# Patient Record
Sex: Male | Born: 1945 | Race: White | Hispanic: No | Marital: Married | Smoking: Former smoker
Health system: Southern US, Community
[De-identification: ages and names within clinical notes are randomized; demographics above are authoritative.]

## PROBLEM LIST (undated history)

## (undated) DIAGNOSIS — I495 Sick sinus syndrome: Secondary | ICD-10-CM

## (undated) DIAGNOSIS — IMO0001 Reserved for inherently not codable concepts without codable children: Secondary | ICD-10-CM

## (undated) DIAGNOSIS — I4892 Unspecified atrial flutter: Secondary | ICD-10-CM

## (undated) DIAGNOSIS — I517 Cardiomegaly: Secondary | ICD-10-CM

## (undated) DIAGNOSIS — I10 Essential (primary) hypertension: Secondary | ICD-10-CM

## (undated) DIAGNOSIS — Z720 Tobacco use: Secondary | ICD-10-CM

## (undated) DIAGNOSIS — I493 Ventricular premature depolarization: Secondary | ICD-10-CM

## (undated) DIAGNOSIS — Z7901 Long term (current) use of anticoagulants: Secondary | ICD-10-CM

## (undated) DIAGNOSIS — I2510 Atherosclerotic heart disease of native coronary artery without angina pectoris: Secondary | ICD-10-CM

## (undated) DIAGNOSIS — I4819 Other persistent atrial fibrillation: Secondary | ICD-10-CM

## (undated) DIAGNOSIS — K219 Gastro-esophageal reflux disease without esophagitis: Secondary | ICD-10-CM

## (undated) DIAGNOSIS — E785 Hyperlipidemia, unspecified: Secondary | ICD-10-CM

## (undated) DIAGNOSIS — I440 Atrioventricular block, first degree: Secondary | ICD-10-CM

## (undated) DIAGNOSIS — F329 Major depressive disorder, single episode, unspecified: Secondary | ICD-10-CM

## (undated) HISTORY — PX: TEE WITH CARDIOVERSION: SHX5442

## (undated) HISTORY — DX: Other persistent atrial fibrillation: I48.19

## (undated) HISTORY — DX: Sick sinus syndrome: I49.5

## (undated) HISTORY — DX: Atherosclerotic heart disease of native coronary artery without angina pectoris: I25.10

## (undated) HISTORY — DX: Major depressive disorder, single episode, unspecified: F32.9

## (undated) HISTORY — DX: Cardiomegaly: I51.7

## (undated) HISTORY — PX: HERNIA REPAIR: SHX51

## (undated) HISTORY — PX: CARDIAC CATHETERIZATION: SHX172

## (undated) HISTORY — DX: Unspecified atrial flutter: I48.92

## (undated) HISTORY — PX: VASECTOMY: SHX75

## (undated) HISTORY — DX: Tobacco use: Z72.0

## (undated) HISTORY — DX: Essential (primary) hypertension: I10

## (undated) HISTORY — DX: Reserved for inherently not codable concepts without codable children: IMO0001

## (undated) HISTORY — DX: Long term (current) use of anticoagulants: Z79.01

## (undated) HISTORY — DX: Hyperlipidemia, unspecified: E78.5

## (undated) HISTORY — DX: Ventricular premature depolarization: I49.3

---

## 1994-08-05 HISTORY — PX: FOOT SURGERY: SHX648

## 1999-10-04 ENCOUNTER — Ambulatory Visit (HOSPITAL_COMMUNITY): Admission: RE | Admit: 1999-10-04 | Discharge: 1999-10-04 | Payer: Self-pay | Admitting: Cardiology

## 2001-07-01 ENCOUNTER — Ambulatory Visit (HOSPITAL_COMMUNITY): Admission: RE | Admit: 2001-07-01 | Discharge: 2001-07-01 | Payer: Self-pay | Admitting: Gastroenterology

## 2001-07-01 ENCOUNTER — Encounter: Payer: Self-pay | Admitting: Gastroenterology

## 2004-12-07 ENCOUNTER — Ambulatory Visit (HOSPITAL_COMMUNITY): Admission: RE | Admit: 2004-12-07 | Discharge: 2004-12-07 | Payer: Self-pay | Admitting: Cardiology

## 2005-01-22 ENCOUNTER — Ambulatory Visit: Payer: Self-pay | Admitting: Internal Medicine

## 2005-01-29 ENCOUNTER — Inpatient Hospital Stay (HOSPITAL_COMMUNITY): Admission: AD | Admit: 2005-01-29 | Discharge: 2005-01-31 | Payer: Self-pay | Admitting: Cardiology

## 2005-01-30 ENCOUNTER — Ambulatory Visit: Payer: Self-pay | Admitting: Internal Medicine

## 2005-03-18 ENCOUNTER — Ambulatory Visit: Payer: Self-pay | Admitting: Internal Medicine

## 2008-01-28 ENCOUNTER — Ambulatory Visit (HOSPITAL_COMMUNITY): Admission: RE | Admit: 2008-01-28 | Discharge: 2008-01-28 | Payer: Self-pay | Admitting: General Surgery

## 2008-01-28 HISTORY — PX: INGUINAL HERNIA REPAIR: SHX194

## 2009-08-05 DIAGNOSIS — IMO0001 Reserved for inherently not codable concepts without codable children: Secondary | ICD-10-CM

## 2009-08-05 HISTORY — DX: Reserved for inherently not codable concepts without codable children: IMO0001

## 2009-08-24 ENCOUNTER — Encounter: Admission: RE | Admit: 2009-08-24 | Discharge: 2009-08-24 | Payer: Self-pay | Admitting: Podiatry

## 2010-08-16 ENCOUNTER — Ambulatory Visit: Payer: Self-pay | Admitting: Cardiology

## 2010-12-18 NOTE — Op Note (Signed)
NAME:  [name], [name] NO.:  [id number]   MEDICAL RECORD NO.:  [id number]          PATIENT TYPE:  AMB   LOCATION:  DAY                          FACILITY:  [facility]   PHYSICIAN:  Angelia Mould. Derrell Lolling, M.D.DATE OF BIRTH:  [birth year]   DATE OF PROCEDURE:  01/28/2008  DATE OF DISCHARGE:                               OPERATIVE REPORT   PREOPERATIVE DIAGNOSIS:  Recurrent right inguinal hernia.   POSTOPERATIVE DIAGNOSIS:  Recurrent right inguinal hernia.   OPERATION PERFORMED:  Laparoscopic, preperitoneal repair of recurrent  right inguinal hernia with mesh.   SURGEON:  Dr. Claud Kelp.   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  He states that mesh was not used.  He has had  pain and a bulge in his right groin for several months.  I evaluated him  as an outpatient and found that he had a moderate-sized right inguinal  hernia but there was no evidence of hernia on the left.  He wanted to  have this repaired electively and is brought to operating room  electively.   OPERATIVE FINDINGS:  On the right side he had a large indirect hernia  sac which was empty and could be dissected well back away from the  abdominal wall.  He also had some weakness and bulging in the direct  space.  No other abnormalities were noted.   OPERATIVE TECHNIQUE:  Following the induction of general endotracheal  anesthesia, a Foley catheter was inserted.  The bladder was emptied.  The Foley was taped the leg and the balloon deflated with the Foley left  in place.  Intravenous antibiotics were given.  The patient was  identified as to correct patient, correct procedure and correct site.  The abdomen and genitalia were prepped and draped in a sterile fashion.  0.5% Marcaine with epinephrine was used as a local infiltration  anesthetic.  A curved transverse incision was made  at the lower rim of  the umbilicus.  The fascia was incised transversely exposing the medial  border of the right rectus muscle.  The dissector balloon was inserted  behind the right rectus muscle through the right rectus sheath down to  the symphysis pubis at the midline.  Video cam was inserted.  The  dissector balloon was inflated under direct vision.  This went well.  I  could identify the rectus muscles anteriorly, preperitoneal fat  posteriorly, inferior epigastric vessels anteriorly up against the  rectus muscles and I could identify the Cooper's ligament bilaterally.  I held the balloon in place for 5 minutes and then deflated it and  removed it.  The balloon on the tip of the trocar was inserted and the  trocar secured and the trocar connected to the insufflator at 12 mmHg.  We inflated the preperitoneal space.  When we inserted the video camera,  there was some blood present.  No real active arterial bleeders but it  looked like he had had some  bleeding from the right anterior abdominal  wall and perhaps around the bladder.  We spent about 5 minutes  irrigating this out.  We did not really find any active bleeding.  The  bladder was not injured.  The urine remained clear.  I felt this might  be due to the fact that he takes aspirin.   We put a 5-mm trocar in the midline just below the insufflator trocar.  Through that trocar, we were able to dissect the peritoneum away from  the left lower quadrant of the abdominal wall.  I inserted the 5-mm  trocar in the left lower quadrant if the abdominal wall.   I then spent some time dissecting the right groin.  I pulled all of the  peritoneum down laterally.  I isolated the cord structures.  I  identified a very large indirect hernia sac.  I dissected this out of  the internal ring and then all the way back and posteriorly until I had  it dissected all the way back to the level of the anterior superior  iliac spine.  The indirect hernia  sac was empty.  The direct space was  also weak and bulged somewhat.   I inserted a large size, Bard 3-D Max mesh and positioned it over the  inguinal floor.  This was positioned so as to overlap the midline  slightly, overlap the Cooper's ligament inferiorly slightly and the  lateral aspect was positioned well out lateral to the internal ring.  The mesh was tacked in place with 5 mm screw tacks.  I placed about four  tacks along the superior tendinous rim of Cooper's ligament, up along  the midline, across laterally in the posterior belly of the rectus  muscle.  Laterally I put about three tacks in the mesh.  I was very  careful laterally to be sure to palpate the tacker through the abdominal  wall to avoid placing any tack below the iliopubic tract.  After all  this was done I irrigated a little bit.  There did not seem to be any  further bleeding.  I inspected all the areas of dissection and there did  not seem to be any other injuries.  The trocars were removed.  There was  no bleeding from the trocar sites.  The pneumoperitoneum was released.  The fascia at the umbilicus was closed with two interrupted figure-of-  eight sutures of 0 Vicryl and the skin incisions were all closed with  subcuticular sutures of 4-0 Monocryl and Dermabond.  Clean bandages were  placed and the patient taken to the recovery room in stable condition.  Estimated blood loss was about 25 mL.   COMPLICATIONS:  None.   SPONGE NEEDLE AND INSTRUMENT COUNTS:  Correct.      Angelia Mould. Derrell Lolling, M.D.  Electronically Signed     HMI/MEDQ  D:  01/28/2008  T:  01/28/2008  Job:  [id number]   cc:   Molly Maduro A. Nicholos Johns, M.D.  Fax: [phone]   Colleen Can. Deborah Chalk, M.D.  Fax: [phone]

## 2010-12-21 NOTE — Op Note (Signed)
NAME:  [name], [name] NO.:  [id number]   MEDICAL RECORD NO.:  [id number]          PATIENT TYPE:  INP   LOCATION:  2035                         FACILITY:  [facility]   PHYSICIAN:  Duke Salvia, M.D.  DATE OF BIRTH:  30-Aug-1945   DATE OF PROCEDURE:  01/30/2005  DATE OF DISCHARGE:                                 OPERATIVE REPORT   PREOPERATIVE DIAGNOSIS:  Supraventricular tachycardia and possible atrial  flutter.   POSTOPERATIVE DIAGNOSIS:  Supraventricular tachycardia and possible atrial  flutter.   PROCEDURE:  Invasive electrophysiology study, electroanatomical mapping, and  radiofrequency catheter ablation.   Following obtaining informed consent, the patient was brought to the  electrophysiology laboratory and placed on the fluoroscopic table in the  supine position.  The patient's INR was greater than 2.  After routine prep  and drape, cardiac catheterization was performed with local anesthesia and  conscious sedation.  Noninvasive blood pressure monitoring and  transcutaneous oxygen saturation monitoring and end tidal CO2 monitoring  were performed continuously throughout the procedure.  Following the  procedure, the catheters were not removed because the patient's ACT was  about 250, the patient was transferred to the holding area in stable  condition.   CATHETERS:  5 French quadripolar catheter was inserted via left femoral vein  to the AV junction to measure His electrogram.  5 French quadripolar catheter was inserted in the left femoral vein to the  right ventricular apex.  9 French ESI rate catheter was inserted via the left femoral vein to the mid  right atrium.  7 [place] duo decapolar catheter was inserted via the right femoral vein to  the tricuspid annulus into the coronary sinus replacing a 6 French octapolar  catheter previously inserted in the coronary sinus.  7 French 5 mm deflectable tip ablation catheter was inserted via an SL2  sheet to  mapping sites in the right atrium to create geometry initially with  a short sheath and then with a longer sheath for ablation.   Service leads 1, AVF, and V1 were monitored continuously throughout the  procedure.  Following insertion of the catheters, the stimulation protocol  included mapping from the coronary sinus into the tricuspid annulus,  incremental atrial pacing, incremental ventricular pacing.   RESULTS:  Basic measurements and results:  Initial rhythm atrial flutter; RR interval 778 milliseconds; PAA interval  359 milliseconds, PR-N/A QRS 147 milliseconds, QT-N/M AH interval N/A, HV  interval 50 milliseconds.  Final rhythm:  Sinus; RR interval 875 milliseconds; PR interval 310  milliseconds; QRS duration 143 milliseconds; QT interval 437 milliseconds; P  wave duration 168 milliseconds; AH interval 193 milliseconds; HV interval 65  milliseconds.  Bundle branch block was present with left axis deviation.   AV nodal function:  AV Wenckebach cycle length was 500 milliseconds, VA conduction was  dissociated 600 milliseconds.  AV conduction no evidence of discontinuity  was identified.   Accessory pathway function:  No evidence of an accessory pathway was seen.   Arrhythmias induced:  The patient presented to the lab in atrial flutter.  It turned out  that the atrial cycle length was very long at 340 milliseconds  as noted previously.  Because of this, electrical anatomical mapping was  attempted.  However, voltages were noted to be very, very low, largely below  0.5 millivolts throughout much  of the atrium, again, very difficult for the  virtuals to be appreciated.  We then took a step back.  We put in a halo  catheter and with mapping demonstrated involvement in the circuit of the  lateral tricuspid annulus and the isthmus as well as the coronary sinus os  region.  Because of this, it was elected to proceed with RF ablation across  the isthmus line not withstanding the lack  of information from the  endocardial ray.   RF energy:  A total of 5 minutes 19 seconds of radiowave energy was applied  across the cavotricuspid isthmus at approximately 6:30 to 7 o'clock on the  tricuspid annular clock.  This resulted in termination of the tachycardia  and the generation of bidirectional isthmus conduction block.   Fluoroscopy time 16 minutes and 55 seconds.  Fluoroscopy was utilized at 7  1/2 frames per second.   IMPRESSION:  1.  Normal sinus function.  2.  Abnormal atrial function manifested by:      1.  Isthmus dependent atrial flutter.      2.  Very low voltages as noted by the sub-straight map.      3.  Very slow atrial flutter cycle length.  3.  Normal AV nodal function.  4.  Normal His system function not withstanding bifascicular block.  5.  No accessory pathway.  6.  Normal ventricular response to programmed stimulation as noted above.   SUMMARY AND CONCLUSION:  The results of electroanatomical mapping and  electrogram mapping were able to confirm that the patient's arrhythmia was  isthmus dependent flutter not withstanding the very long cycle length.  RF  energy delivered across the cavotricuspid isthmus successfully interrupted  conduction across the isthmus and eliminated the patient's sub-straight.   The patient will be observed overnight and maintained on Coumadin.  Endocarditis prophylaxis for six weeks.       SCK/MEDQ  D:  01/30/2005  T:  01/30/2005  Job:  [id number]   cc:   Colleen Can. Deborah Chalk, M.D.  Fax: [phone]

## 2010-12-21 NOTE — Procedures (Signed)
[facility]  Patient:    [name], [name] Visit Number: [id number] MRN: [id number]          Service Type: Attending:  Verlin Rivas, M.D. Dictated by:   [name], M.D. Proc. Date: 07/01/01                             Procedure Report  PROCEDURE:  Colonoscopy.  REFERRING PHYSICIAN:  Amada Jupiter T. Rivas, M.D.  INDICATION FOR PROCEDURE:  [name] (DOB:  1946/01/21) is a 65 year old male. [name] underwent a proctocolonoscopy to the cecum Dec 29, 1995 to investigate Hemoccult positive stool. From the descending colon, a 3 cm pedunculated hamartomatous polyp was removed with the electrocautery snare. [name] is referred for follow-up colonoscopy with polypectomy to prevent colon cancer.  I discussed with [name] the complications associated with colonoscopy and polypectomy including a 15/1000 risk of bleeding and 11/998 risk of colon perforation requiring emergency surgery. The patient has signed the operative permit.  ENDOSCOPIST:  [name], M.D.  PREMEDICATION:  Versed 10 mg, Demerol 50 mg.  ENDOSCOPE:  Olympus pediatric colonoscope.  DESCRIPTION OF PROCEDURE:  After obtaining informed consent, [name]  placed in the left lateral decubitus position. I administered intravenous Demerol and intravenous Versed to achieve conscious sedation for the procedure. The patients cardiac rhythm, oxygen saturation and blood pressure were monitored throughout the procedure and documented in the medical record.  Anal inspection was normal. Digital rectal exam revealed an enlarged but non-nodular prostate. The Olympus pediatric video colonoscope was introduced into the rectum and with some difficulty advanced to the hepatic flexure. [name] has a very tortuous left colon and despite applying external abdominal pressure and rolling the patient from the left lateral decubitus position to the supine position and  finally to the right lateral decubitus position, I could not complete a full colonoscopy.  Colonic preparation for the exam today was excellent.  RECTUM:  Normal.  SIGMOID COLON/DESCENDING COLON:  Normal.  SPLENIC FLEXURE:  Normal.  TRANSVERSE COLON:  Normal.  HEPATIC FLEXURE:  Normal.  ASCENDING COLON/CECUM/ILEOCECAL VALVE:  Not examined.  ASSESSMENT:  Normal proctocolonoscopy to the hepatic flexure. A complete colonoscopy could not be performed due to colonic loop formation.  PLAN:  I will refer [name] to the [facility] for an air contrast barium enema later this afternoon. Dictated by:   [name], M.D. Attending:  Verlin Rivas, M.D. DD:  07/01/01 TD:  07/01/01 Job: [id number]

## 2010-12-21 NOTE — Procedures (Signed)
[place]  Patient:    [name], [name]                        MRN: [id number] Proc. Date: 10/04/99 Adm. Date:  [id number] Attending:  [place], Peter Manning CC:         Darden Palmer., M.D.                           Procedure Report  PROCEDURE:  Transesophageal echocardiogram with cardioversion.  SURGEON:  Peter M. [place], M.D.  ANESTHESIA:  INDICATIONS FOR PROCEDURE:  A 65 year old white male with recurrent atrial flutter, symptomatic.  DESCRIPTION OF PROCEDURE:  The patients throat was anesthetized with Cetacaine spray.  He was given 4 mg of IV Versed and 40 mg of IV Demerol with good sedation. The transesophageal scope was passed easily into the esophagus and adequate images were obtained.  FINDINGS:  The left ventricle was normal in size, wall thickness and contractility with normal systolic function.  The mitral valve is pliable without evidence of  mitral valve prolapse.  The aortic valve is trileaflet and pliable.  The tricuspid and pulmonic valves appear normal.  Left atrial size and right atrial size appear normal.  Right ventricular size and function is normal.  There is no pericardial effusion.  The proximal aorta and descending aorta appear normal in size without significant atherosclerosis.  No intracardiac thrombus is noted.  The left atrial appendage is normal.  DOPPLER DATA:  Color flow Doppler demonstrates mild mitral insufficiency and trivial aortic insufficiency.  ELECTIVE CARDIOVERSION:  After negative transesophageal echo findings, the patient underwent elective cardioversion.  Per anesthesia, he was given 150 mg of IV pentothal.  He was given a single desynchronized DC shock at 150 joules, and was promptly returned to normal sinus rhythm at a rate of 70 beats per minute.  The  patient tolerated the procedure well.  IMPRESSION: 1. Essentially normal transesophageal echocardiogram with mild mitral   insufficiency.  No thrombus is seen. 2. Successful elective cardioversion. DD:  10/04/99 TD:  10/05/99 Job: [id number]

## 2010-12-21 NOTE — H&P (Signed)
NAME:  [name], [name] NO.:  [id number]   MEDICAL RECORD NO.:  [id number]          PATIENT TYPE:  OIB   LOCATION:  2899                         FACILITY:  [facility]   PHYSICIAN:  Colleen Can. Deborah Chalk, M.D.DATE OF BIRTH:  07-30-1946   DATE OF ADMISSION:  12/07/2004  DATE OF DISCHARGE:  12/07/2004                                HISTORY & PHYSICAL   CHIEF COMPLAINT:  Palpitations, weakness, fatigue.   HISTORY OF PRESENT ILLNESS:  [name] is a very pleasant 65 year old white  male.  He has multiple medical problems.  These include a known history of  ischemic heart disease.  He has undergone catheterization towards the  earlier part of May of this year.  He has felt best to be managed medically.  He has also had a history of atrial fibrillation/flutter.  He has been noted  to have recurrent arrhythmia and was subsequently started on Coumadin  therapy towards the latter part of the May of this year.  He has been seen  back in the office on several occasions since this time.  He continues to  atrial flutter.  His EKGs have been reviewed with Dr. Berton Mount.  There is  concern that with his significant slow ventricular response, that we may be  dealing with some type of reentrant tachycardia.  He is not referred for  elective admission.  His antiarrhythmic medicines have been discontinued  prior to admission with plans for him to undergo EP study during this  admission.  Clinically he has had no complaints of syncope.   PAST MEDICAL HISTORY:  1.  History of atrial fibrillation/flutter.  He had a previous TEE      cardioversion per Dr. Peter [place] in March of 2001.  2.  Recurrent atrial flutter with variable block.  3.  Initiation of Coumadin therapy.  4.  Longstanding depression.  5.  Hypertension.  6.  History of left inguinal hernia repair.  7.  Right inguinal hernia repair.  8.  History of vasectomy.  9.  History of catheterization remotely in April 1990 with repeat     catheterization in May 2006 showing essentially normal left ventricular      function, moderate two-vessel disease (50% LAD after the third diagonal      and a 60+% stenosis in the second obtuse marginal).  He has felt he best      be managed medically.  10. Remote tobacco abuse.  11. Previous chronic antiarrhythmic therapy with Tambocor.   ALLERGIES:  HORSE SERUM.   CURRENT MEDICATIONS:  1.  Wellbutrin 300 mg daily.  2.  Crestor 5 mg daily.  3.  Coumadin 5 mg daily.  4.  It should be noted his digoxin has been discontinued as of Sunday, June      25 .  5.  His Tambocor has been discontinued as of Monday, June 26.   FAMILY HISTORY:  Father died of a heart attack at age 70.  Mother died at 19  with heart failure.   SOCIAL HISTORY:  He has no current alcohol or tobacco use.  He  is married.   REVIEW OF SYSTEMS:  Basically as noted above and is otherwise unremarkable.   PHYSICAL EXAMINATION:  GENERAL:  He is a pleasant, somewhat somnolent white  male.  He is currently in no acute distress.  VITAL SIGNS:  Blood pressure 110/80 sitting and standing.  Heart rate is 80.  LUNGS:  Basically clear.  CARDIAC:  Fairly regular rhythm.  ABDOMEN:  Soft.  EXTREMITIES:  He has no peripheral edema.  NEUROLOGIC:  Intact.  There are no gross focal deficits.   LABORATORY DATA:  Pertinent labs are pending.   IMPRESSION:  1.  Recurrent atrial arrhythmia with slow ventricular response.  2.  Coumadin therapy.  He has been therapeutic since January 07, 2005.  3.  Known ischemic heart disease (two-vessel disease in the left anterior      descending and second obtuse marginal branch).  4.  Depression.  5.  Hyperlipidemia currently on low-dose Crestor.  6.  Previous chronic antiarrhythmic therapy with Tambocor.   PLAN:  Will proceed on with admission to the hospital.  He is currently off  Tambocor as well as Lanoxin.  We will make plans for him to undergo EP study  with Dr. Graciela Husbands tentatively on  Wednesday.  He will be watched on the monitor.  Complete labs will be checked as well.       LC/MEDQ  D:  01/23/2005  T:  01/23/2005  Job:  [id number]   cc:   Duke Salvia, M.D.

## 2010-12-21 NOTE — H&P (Signed)
[name], SUSMAN NO.:  [id number]   MEDICAL RECORD NO.:  [id number]          PATIENT TYPE:  INP   LOCATION:  2035                         FACILITY:  [facility]   PHYSICIAN:  Virl Axe, M.D.     DATE OF BIRTH:  1946/04/28   DATE OF ADMISSION:  01/29/2005  DATE OF DISCHARGE:                                HISTORY & PHYSICAL   PRIMARY CAREGIVER:  Dr. Audree Camel. [facility].   ELECTROPHYSIOLOGIST:  Dr. Deboraha Sprang.   CARDIOLOGIST:  Dr. Ludwig Lean. [facility].   ALLERGIES:  HORSE SERUM.   HISTORY OF PRESENT ILLNESS:  Mr. Pinn is a 65 year old male with a history  of atrial arrhythmias dating back to 70.  This summer, for example, he had  but two episodes, but with these he feels chest tightness, blurred vision,  nausea, fatigue and malaise.  He can best deal with them by lying on the  floor.  They have in the past lasted for minutes to hours.  Lately they  have become more frequent, especially in the last two to three months.  He  had a prior DC cardioversion in March 2001.  He is currently treated with  Flecainide.  The Flecainide has been placed on hold, anticipating the  electrophysiology study and ablation, which is planned for January 30, 2005.  His atrial flutter pattern is more flutteroid, with a slow ventricular  response.  There is some concern as to the underlying rhythm, should the  flutter signal be abolished.  He will be studied with ESI mapping, and  hopefully successful location of the flutter and ablation of its pathway.   MEDICATIONS:  1.  Crestor 5 mg at bedtime.  2.  Coumadin 5 mg daily.  3.  Wellbutrin 300 mg daily.  4.  Digoxin 0.25 mg daily, held as of Sunday, January 27, 2005.  5.  Flecainide held as of January 28, 2005.   SOCIAL HISTORY:  The patient lives in [place] with his wife.  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  He does not smoke.  He takes one or two beers a  day.  He has four children,  in good health.   FAMILY HISTORY:  Mother died at age 67 of old age.  Father died at age 73.  He had coronary artery disease.  He had a valve replacement.  He had end-  stage renal disease at the last.  One sister died at age 13 of colon cancer.   REVIEW OF SYSTEMS:  The patient is not experiencing fevers, chills, night  sweats, weight change or adenopathy.  HEENT:  No nasal discharge, epistaxis,  hoarseness, vertigo, photophobia.  INTEGUMENT:  No non-healing ulcerations.  He does have erythema exhibited as a facial blush,  which the patient does  not feel as a flushing but the patient's wife says it is quite prominent at  times.  The patient is on Wellbutrin, which is known for symptoms of  erythema multiforme.  This perhaps needs further research.  CARDIOPULMONARY:  The patient does experience chest tightness when he is in atrial flutter.  Also shortness of breath, particularly exhibited on exertion.  He has some  lower extremity edema which usually clears after a night of sleep.  With his  flutter, he does feel palpitation.  He also has paroxysmal nocturnal  dyspnea.  GENITOURINARY:  Nocturia x1-2 daily.  NEUROPYSCHIATRIC:  The  patient has longstanding depression.  GI:  No GI symptoms such as nausea,  vomiting, diarrhea, except he has nausea with his atrial flutter, rapid  rates.  No history of peptic ulcer disease or gastroesophageal reflux  disease.  ENDOCRINE:  No history of diabetes or thyroid.  His thyroid  studies have all usually been normal.  MUSCULOSKELETAL:  No arthralgias, no  deformities or pain.  All other systems are negative.   PHYSICAL EXAMINATION:  VITAL SIGNS:  Would be on the E-chart, are not  available, since the E-chart is down.  GENERAL:  He is alert and oriented x3, an excellent historian.  HEENT:  Normocephalic and atraumatic.  Eyes:  Pupils equal, round, reactive  to light.  Extraocular movements intact.  Sclerae anicteric.   Nares without  discharge.  NECK:  Supple, no bruits auscultated.  No thyromegaly.  No jugular venous  distention.  No cervical lymphadenopathy.  HEART:  An irregular rate and rhythm, without murmurs or rubs.  Point of  maximal impulse is not displaced.  LUNGS:  Clear to auscultation and percussion bilaterally.  ABDOMEN:  Soft, non-distended.  Bowel sounds are present.  No  hepatosplenomegaly.  The abdominal aorta is non-pulsatile, non-expansile.  EXTREMITIES:  Show no evidence of clubbing, cyanosis or edema at the present  time.  No rash or lesions except the facial presentation shows an overall  blush.  Dorsalis pedis pulses 4/4 bilaterally.  Radial pulses 4/4  bilaterally.  MUSCULOSKELETAL:  No joint deformities, effusions or scoliosis.  NEUROLOGIC:  No focal neurological deficits noted.   A chest x-ray is pending.   His electrocardiogram on January 29, 2005, shows a heart rate of 84.  There are  slow flutter waves, giving a flutteroid appearance to the R:R interval.   LABORATORY DATA:  Consisting of CBC, CMET, PT, PTT, a digitalis level and  Flecainide level are all pending.   IMPRESSION:  1.  A 10-year-history of paroxysmal tachyarrhythmias.  2.  Slow atrial flutter with a flutteroid-appearing electrocardiogram.  3.  Coumadin therapy.  4.  Catheterization in May 2006, with normal left ventricular function.  The      left anterior descending coronary artery had a 50% stenosis after the      third diagonal, obtuse marginal had a 60%.  5.  Dyslipidemia.  6.  Depression.  7.  Hypertension.  8.  Status post vasectomy, bilateral inguinal herniorrhaphies.   PLAN:  To be n.p.o. this evening.  He will have electrophysiology study with  ES mapping and ablation of slow atrial flutter.      Soledad Gerlach   GM/MEDQ  D:  01/29/2005  T:  01/29/2005  Job:  [id number]

## 2010-12-21 NOTE — Discharge Summary (Signed)
NAME:  [name], [name] NO.:  [id number]   MEDICAL RECORD NO.:  [id number]          PATIENT TYPE:  INP   LOCATION:  2035                         FACILITY:  [facility]   PHYSICIAN:  [name], M.D.DATE OF BIRTH:  January 26, 1946   DATE OF ADMISSION:  01/29/2005  DATE OF DISCHARGE:  01/31/2005                                 DISCHARGE SUMMARY   PRIMARY DISCHARGE DIAGNOSES:  1.  Symptomatic atrial flutter with subsequent successful atrial flutter      ablation per Dr. Berton Rivas.  2.  Known history of ischemia heart disease with catheterization in 5/06      showing normal left ventricular function, moderate two vessel disease      (50% LAD after the third diagonal, 60+% stenosis in the second obtuse      marginal).  He is felt to best be managed medically.  3.  Previous antiarrhythmic therapy with Tambocor, now discontinued.  4.  Remote tobacco abuse.  5.  Hypertension currently controlled.  6.  Long-standing depression.  7.  Coumadin therapy.   HISTORY OF PRESENT ILLNESS:  The patient is a very pleasant 65 year old  male.  He has multiple medical problems.  He has had recurrent atrial  flutter and has been placed on Coumadin anticoagulation.  He has had a  variable block in response to his atrial flutter with a significantly slow  ventricular response.  He was subsequently referred for elective admission  in order for his antiarrhythmic medicines to be discontinued as well as for  him to undergo EP study with plans for possible ablation.  Clinically he has  been symptomatic with complaints of chest discomfort as well as shortness of  breath and palpitations.   Please see the dictating history and physical for further patient  presentation and profile.   ADMISSION LABORATORY DATA:  CBC was normal.  Chemistries were normal.  Chest  x-ray was unremarkable except for mild hyperinflation.  His INR was 1.8 and  subsequently 2.2.  Digoxin level was less than 0.2.   Flecainide level  remains pending.   A 12-lead electrocardiogram initially showed atrial flutter.   HOSPITAL COURSE:  The patient was admitted.  His digoxin had been  discontinued as of Sunday, 6/25.  His Tambocor had been discontinued as of  Monday, 6/26.  He was admitted to the hospital.  He was monitored on  telemetry.  He underwent EP study with Dr. Berton Rivas on 01/30/05.  That  procedure was tolerated well without any known complications.  Subsequent  ablation was performed with an overall satisfactory response obtained.  Post  procedure he was transferred back to 2000.  He has been maintained in sinus  rhythm with a first degree AV block.  His overall physical examination is  unremarkable.  His INR at discharge is 2.1 and he is felt to be a stable  candidate for discharge today.   DISCHARGE MEDICATIONS:  1.  We will continue to leave him off of Lanoxin as well as Tambocor.  2.  He may resume Crestor 5 mg a day.  3.  Coumadin 5 mg a day.  4.  Wellbutrin 300 mg a day.   DISCHARGE ACTIVITIES:  His activity is to be increased slowly.  He may  shower and bathe.  He may walk up steps.  He will not return to work until  Wednesday, 02/06/05.   DISCHARGE DIET:  His diet is to be heart healthy.   FOLLOWUP:  We will have him seen back in the office in approximately one to  two weeks.  He is asked to call to set that appointment up.  He has an  appointment to see Dr. Berton Rivas on Monday, 8/14, at 3:00 p.m.  He is to  call with any problems that arise in the interim.       LC/MEDQ  D:  01/31/2005  T:  01/31/2005  Job:  [id number]   cc:   [name], M.D.

## 2010-12-21 NOTE — Cardiovascular Report (Signed)
NAME:  BRYTON, ROMAGNOLI NO.:  [id number]   MEDICAL RECORD NO.:  [id number]          PATIENT TYPE:  OIB   LOCATION:  2899                         FACILITY:  [facility]   PHYSICIAN:  Colleen Can. Deborah Chalk, M.D.DATE OF BIRTH:  1945/12/07   DATE OF PROCEDURE:  12/07/2004  DATE OF DISCHARGE:                              CARDIAC CATHETERIZATION   HISTORY:  Mr. Pat is a 65 year old male with long-standing history of  atrial fibrillation/flutter with history of palpitations.  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  He has had a feeling of chest tightness that has  an exertional component to it.  He is now referred for evaluation of his  chest tightness and underlying coronary atherosclerosis.   PROCEDURE:  Left heart catheterization with Cypher coronary angiography and  left ventricular angiography.   TYPE AND SITE OF ENTRY:  Percutaneous right femoral artery with Angio-Seal.   CATHETER:  A 6 [place] _4 curve_________ Judkins' right and left coronary  cath, 6 French pigtail ventriculographic catheter.   CONTRAST MATERIAL:  Omnipaque.   MEDICATIONS GIVEN PRIOR TO PROCEDURE:  Valium 10 mg p.o.   MEDICATIONS GIVEN DURING THE PROCEDURE:  Versed 2 mg IV.   COMMENTS:  The patient tolerated the procedure well.   ANGIOGRAPHIC DATA:  The left ventricular pressure 126/7-13, aortic pressure  114/75.  There is no aortic valve gradient noted on pullback.   The left ventricular angiogram was performed in the RAO position.  Overall  cardiac size and silhouette are normal.  There was a ventricular tachycardia  during the left ventricular angiogram, so the ejection fraction data is less  than accurate.  However, by echocardiogram done two days ago, he had normal  global ejection fraction and findings would be consistent with that.   CORONARY ARTERIES:  The coronary arteries arise and distribute normally.  1.  Left main coronary artery is normal.  2. Left anterior descending: Left anterior descending has scattered      irregularities.  There is a 40-50% narrowing after the origin of the      third diagonal vessel and in the left anterior descending proper.  This      does not appear to be enough to be obstructive in nature.  3. Left circumflex: Left circumflex is a moderate-sized vessel.  There are      two marginal vessels, almost of equal size, but the second marginal is      larger than the first.  In the second marginal vessel, there is a 50-      60% focal narrowing.  There is also a third marginal vessel and a      continuation in the AV groove.  The lesion in the second marginal      vessel is the one of most concern, but it is still felt to be moderate      in nature and probably 60% luminal narrowing, but at most 70% narrowed.  4. Right coronary artery: The right coronary artery is  a moderately large      dominant vessel.  It is normal.     OVERALL IMPRESSION:  1. Essentially normal left ventricular function.  2. Moderate two-vessel coronary disease (50% LAD stenosis after the third      diagonal vessel, 60+% stenosis in the second obtuse marginal.     DISCUSSION:  We will try to manage Mr. Neuharth medically.  We will consider  antiplatelet measures as well as aggressive lipid management.  He could be  considered a candidate for intervention on this vessel, but, at most, it  would be 2.5 cm in diameter, but it could be suitable for a percutaneous  intervention, but is felt not to be severe enough to warrant that at this  point in time.      SNT/MEDQ  D:  12/07/2004  T:  12/07/2004  Job:  [id number]

## 2011-02-04 ENCOUNTER — Encounter: Payer: Self-pay | Admitting: *Deleted

## 2011-02-05 ENCOUNTER — Encounter: Payer: Self-pay | Admitting: Cardiology

## 2011-02-12 ENCOUNTER — Ambulatory Visit (INDEPENDENT_AMBULATORY_CARE_PROVIDER_SITE_OTHER): Payer: Medicare Other | Admitting: Nurse Practitioner

## 2011-02-12 ENCOUNTER — Encounter: Payer: Self-pay | Admitting: Nurse Practitioner

## 2011-02-12 VITALS — BP 120/74 | HR 60 | Ht 73.0 in | Wt 214.2 lb

## 2011-02-12 DIAGNOSIS — I4892 Unspecified atrial flutter: Secondary | ICD-10-CM

## 2011-02-12 DIAGNOSIS — I2510 Atherosclerotic heart disease of native coronary artery without angina pectoris: Secondary | ICD-10-CM

## 2011-02-12 DIAGNOSIS — I10 Essential (primary) hypertension: Secondary | ICD-10-CM

## 2011-02-12 DIAGNOSIS — I483 Typical atrial flutter: Secondary | ICD-10-CM | POA: Insufficient documentation

## 2011-02-12 DIAGNOSIS — R002 Palpitations: Secondary | ICD-10-CM

## 2011-02-12 HISTORY — DX: Essential (primary) hypertension: I10

## 2011-02-12 NOTE — Assessment & Plan Note (Signed)
S/P ablation in 2006. No change in his palpitations. He does not want to repeat an event monitor. He will continue to monitor at home and call for any problems in the interim.

## 2011-02-12 NOTE — Progress Notes (Signed)
[name] Date of Birth: 03-31-1946   History of Present Illness: [name] is seen back today for his 6 month visit. He is seen for Dr. Graciela Husbands. He is a former patient of Dr. Ronnald Nian. He has a history of 3 vessel CAD, atrial flutter with prior ablation and PVC's. Overall, he is doing well. He continues to have palpitations. No worse and no real change. He does not think it is atrial flutter. He has gotten into a swimming program and has lost weight. Blood pressure looks good. He is dating some. His wife died about 2 years ago. His last stress test was last summer. He does not wish to take statin therapy. Labs have been checked by primary care.   Current Outpatient Prescriptions on File Prior to Visit  Medication Sig Dispense Refill  . aspirin 325 MG tablet Take 650 mg by mouth daily.        . DULoxetine (CYMBALTA) 30 MG capsule Take 5-7 mg by mouth daily.        . fish oil-omega-3 fatty acids 1000 MG capsule Take 2 g by mouth. Occasionally       . multivitamin (THERAGRAN) per tablet Take 1 tablet by mouth. Occasionally        . GARLIC PO Take 1 tablet by mouth. Occasionally          Allergies  Allergen Reactions  . Horse-Derived Products     Past Medical History  Diagnosis Date  . Hypertension   . Hyperlipidemia   . Status post ablation of atrial flutter 2006  . PVC's (premature ventricular contractions)   . CAD (coronary artery disease)     Mild 3 vessel disease per cath in 2006  . Normal nuclear stress test 2011    Past Surgical History  Procedure Date  . Inguinal hernia repair 1957  . Inguinal hernia repair 1987  . Vasectomy   . Tee with cardioversion 10/04/1999    Transesophageal echocardiogram with cardioversion -- Essentially normal transesophageal echocardiogram with mild mitral insufficiency.  No thrombus is seen -- Successful elective cardioversion -- SURGEON:  Peter M. [place], M.D.  . Cardiac catheterization 12/07/2004    showing essentially normal left   ventricular function, moderate two-vessel disease (50% LAD after the third diagonal and a 60+% stenosis in the second obtuse marginal)         . Cardiac catheterization 11/1990  . Tee with cardioversion 01/30/2005    Invasive electrophysiology study, electroanatomical mapping,and radiofrequency catheter ablation. -- patient's arrhythmia was isthmus dependent flutter not withstanding the very long cycle length.  RF energy delivered across the cavotricuspid isthmus successfully interrupted conduction across the isthmus and eliminated the patient's sub-straight. PHYSICIAN:  Duke Salvia, M.D  . Inguinal hernia repair 01/28/2008    Recurrent right inguinal hernia Laparoscopic, preperitoneal repair of recurrent right inguinal hernia with mesh -- SURGEON:  Dr. Claud Kelp.  . Cardiac ablation 2006    for atrial flutter    History  Smoking status  . Former Smoker -- 4 years  . Types: Cigarettes  . Quit date: 08/05/1966  Smokeless tobacco  . Not on file    History  Alcohol Use No    Family History  Problem Relation Age of Onset  . Heart attack Father   . Atrial fibrillation Mother     flutter  . Heart failure Mother   . Cancer Sister     colon    Review of Systems: The review of systems is positive  for occasional palpitations.  All other systems were reviewed and are negative.  Physical Exam: BP 120/74  Pulse 60  Ht 6\' 1"  (1.854 m)  Wt 214 lb 3.2 oz (97.16 kg)  BMI 28.26 kg/m2 Patient is very pleasant and in no acute distress. Skin is warm and dry. Color is normal.  HEENT is unremarkable. Normocephalic/atraumatic. PERRL. Sclera are nonicteric. Neck is supple. No masses. No JVD. Lungs are clear. Cardiac exam shows a regular rate and rhythm. Abdomen is soft. Extremities are without edema. Gait and ROM are intact. No gross neurologic deficits noted.  LABORATORY DATA: n/a   Assessment / Plan:

## 2011-02-12 NOTE — Assessment & Plan Note (Signed)
No chest pain. Exercise tolerance is good. He is up to date with his stress testing. Will see him back in 6 months.

## 2011-02-12 NOTE — Assessment & Plan Note (Signed)
Blood pressure looks good.  

## 2011-02-12 NOTE — Patient Instructions (Signed)
Stay on your same medicines I will have you see Dr. Graciela Husbands in 6 months If your palpitations get any worse or change in character, call me so we can put a monitor on. Call for any problems.

## 2011-05-02 LAB — URINALYSIS, ROUTINE W REFLEX MICROSCOPIC
Bilirubin Urine: NEGATIVE
Glucose, UA: NEGATIVE
Hgb urine dipstick: NEGATIVE
Ketones, ur: NEGATIVE
Protein, ur: NEGATIVE
pH: 6

## 2011-05-02 LAB — DIFFERENTIAL
Eosinophils Absolute: 0.1
Eosinophils Relative: 2
Lymphocytes Relative: 31
Lymphs Abs: 1.5
Monocytes Absolute: 0.4
Monocytes Relative: 8

## 2011-05-02 LAB — COMPREHENSIVE METABOLIC PANEL
ALT: 18
AST: 19
Albumin: 3.4 — ABNORMAL LOW
CO2: 30
Calcium: 9.2
Creatinine, Ser: 1.01
GFR calc Af Amer: >60
GFR calc non Af Amer: >60
Sodium: 141

## 2011-05-02 LAB — CBC
Hemoglobin: 16
RBC: 4.92
RDW: 13.4

## 2011-08-12 DIAGNOSIS — N529 Male erectile dysfunction, unspecified: Secondary | ICD-10-CM | POA: Diagnosis not present

## 2011-08-12 DIAGNOSIS — Z0000 Encounter for general adult medical examination without abnormal findings: Secondary | ICD-10-CM | POA: Diagnosis not present

## 2011-08-12 DIAGNOSIS — Z23 Encounter for immunization: Secondary | ICD-10-CM | POA: Diagnosis not present

## 2011-08-12 DIAGNOSIS — F329 Major depressive disorder, single episode, unspecified: Secondary | ICD-10-CM | POA: Diagnosis not present

## 2011-08-12 DIAGNOSIS — F3289 Other specified depressive episodes: Secondary | ICD-10-CM | POA: Diagnosis not present

## 2011-08-12 DIAGNOSIS — Z125 Encounter for screening for malignant neoplasm of prostate: Secondary | ICD-10-CM | POA: Diagnosis not present

## 2011-08-12 DIAGNOSIS — E7800 Pure hypercholesterolemia, unspecified: Secondary | ICD-10-CM | POA: Diagnosis not present

## 2011-08-12 DIAGNOSIS — K4090 Unilateral inguinal hernia, without obstruction or gangrene, not specified as recurrent: Secondary | ICD-10-CM | POA: Diagnosis not present

## 2011-08-27 DIAGNOSIS — I781 Nevus, non-neoplastic: Secondary | ICD-10-CM | POA: Diagnosis not present

## 2011-08-27 DIAGNOSIS — L821 Other seborrheic keratosis: Secondary | ICD-10-CM | POA: Diagnosis not present

## 2011-08-27 DIAGNOSIS — Z85828 Personal history of other malignant neoplasm of skin: Secondary | ICD-10-CM | POA: Diagnosis not present

## 2011-08-27 DIAGNOSIS — D239 Other benign neoplasm of skin, unspecified: Secondary | ICD-10-CM | POA: Diagnosis not present

## 2011-08-27 DIAGNOSIS — L570 Actinic keratosis: Secondary | ICD-10-CM | POA: Diagnosis not present

## 2011-08-27 DIAGNOSIS — L259 Unspecified contact dermatitis, unspecified cause: Secondary | ICD-10-CM | POA: Diagnosis not present

## 2011-10-14 ENCOUNTER — Ambulatory Visit (INDEPENDENT_AMBULATORY_CARE_PROVIDER_SITE_OTHER): Payer: Medicare Other | Admitting: General Surgery

## 2011-10-14 ENCOUNTER — Telehealth (INDEPENDENT_AMBULATORY_CARE_PROVIDER_SITE_OTHER): Payer: Self-pay

## 2011-10-14 ENCOUNTER — Encounter (INDEPENDENT_AMBULATORY_CARE_PROVIDER_SITE_OTHER): Payer: Self-pay | Admitting: General Surgery

## 2011-10-14 VITALS — BP 136/92 | HR 57 | Temp 97.6°F | Ht 73.0 in | Wt 221.0 lb

## 2011-10-14 DIAGNOSIS — K4091 Unilateral inguinal hernia, without obstruction or gangrene, recurrent: Secondary | ICD-10-CM

## 2011-10-14 NOTE — Progress Notes (Signed)
Patient ID: [name], male   DOB: 1946-01-22, 66 y.o.   MRN: [id number]  Chief Complaint  Patient presents with  . Pre-op Exam    eval hernia    HPI  [name] is a 66 y.o. male.  He is referred back to me by Dr. [name] for a recurrent left inguinal hernia.  This gentleman underwent repair of left inguinal hernia at age 49. He underwent open repair of right inguinal hernia in 1987. I repaired his recurrent right inguinal hernia in June 2009, a laparoscopic repair with mesh. He has done well with that surgery.  Recent examination by Dr. Nicholos Johns reveals a left inguinal hernia. The patient states that he was not that symptomatic at that time but has become a little bit more symptomatic and now feels some discomfort in the left groin, but he does not feel a bulge.  Past history is significant for atrial fibrillation and atrial flutter, status post endocardial ablation, now followed by Dr. Berton Mount. This has been stable.  The patient now was to consider elective repair of his recurrent left inguinal hernia. HPI  Past Medical History  Diagnosis Date  . Hypertension   . Hyperlipidemia   . Status post ablation of atrial flutter 2006  . PVC's (premature ventricular contractions)   . CAD (coronary artery disease)     Mild 3 vessel disease per cath in 2006  . Normal nuclear stress test 2011    Past Surgical History  Procedure Date  . Inguinal hernia repair 1957  . Inguinal hernia repair 1987  . Vasectomy   . Tee with cardioversion 10/04/1999    Transesophageal echocardiogram with cardioversion -- Essentially normal transesophageal echocardiogram with mild mitral insufficiency.  No thrombus is seen -- Successful elective cardioversion -- SURGEON:  Peter M. [place], M.D.  . Cardiac catheterization 12/07/2004    showing essentially normal left  ventricular function, moderate two-vessel disease (50% LAD after the third diagonal and a 60+% stenosis in the second obtuse marginal)          . Cardiac catheterization 11/1990  . Tee with cardioversion 01/30/2005    Invasive electrophysiology study, electroanatomical mapping,and radiofrequency catheter ablation. -- patient's arrhythmia was isthmus dependent flutter not withstanding the very long cycle length.  RF energy delivered across the cavotricuspid isthmus successfully interrupted conduction across the isthmus and eliminated the patient's sub-straight. PHYSICIAN:  Duke Salvia, M.D  . Inguinal hernia repair 01/28/2008    Recurrent right inguinal hernia Laparoscopic, preperitoneal repair of recurrent right inguinal hernia with mesh -- SURGEON:  Dr. Claud Kelp.  . Cardiac ablation 2006    for atrial flutter  . Hernia repair   . Foot surgery 1996    left    Family History  Problem Relation Age of Onset  . Heart attack Father   . Cancer Father     colon  . Kidney disease Father   . Atrial fibrillation Mother     flutter  . Heart failure Mother   . Cancer Sister     colon  . Cancer Paternal Uncle     lung    Social History History  Substance Use Topics  . Smoking status: Former Smoker -- 4 years    Types: Cigarettes    Quit date: 08/05/1966  . Smokeless tobacco: Not on file  . Alcohol Use: 1.2 oz/week    2 Shots of liquor per week    Allergies  Allergen Reactions  . Horse-Derived Products  Current Outpatient Prescriptions  Medication Sig Dispense Refill  . aspirin 325 MG tablet Take 650 mg by mouth daily.        . DULoxetine (CYMBALTA) 30 MG capsule Take 5-7 mg by mouth daily.          Review of Systems Review of Systems  Constitutional: Negative for fever, chills and unexpected weight change.  HENT: Negative for hearing loss, congestion, sore throat, trouble swallowing and voice change.   Eyes: Negative for visual disturbance.  Respiratory: Negative for cough and wheezing.   Cardiovascular: Positive for palpitations. Negative for chest pain and leg swelling.  Gastrointestinal:  Negative for nausea, vomiting, abdominal pain, diarrhea, constipation, blood in stool, abdominal distention, anal bleeding and rectal pain.  Genitourinary: Negative for hematuria, discharge and difficulty urinating.  Musculoskeletal: Negative for arthralgias.  Skin: Negative for rash and wound.  Neurological: Negative for seizures, syncope, weakness and headaches.  Hematological: Negative for adenopathy. Does not bruise/bleed easily.  Psychiatric/Behavioral: Negative for confusion.    Blood pressure 136/92, pulse 57, temperature 97.6 F (36.4 C), temperature source Temporal, height 6\' 1"  (1.854 m), weight 221 lb (100.245 kg), SpO2 97.00%.  Physical Exam Physical Exam  Constitutional: He is oriented to person, place, and time. He appears well-developed and well-nourished. No distress.  HENT:  Head: Normocephalic.  Nose: Nose normal.  Mouth/Throat: No oropharyngeal exudate.  Eyes: Conjunctivae and EOM are normal. Pupils are equal, round, and reactive to light. Right eye exhibits no discharge. Left eye exhibits no discharge. No scleral icterus.  Neck: Normal range of motion. Neck supple. No JVD present. No tracheal deviation present. No thyromegaly present.  Cardiovascular: Normal rate, regular rhythm, normal heart sounds and intact distal pulses.   No murmur heard. Pulmonary/Chest: Effort normal and breath sounds normal. No stridor. No respiratory distress. He has no wheezes. He has no rales. He exhibits no tenderness.  Abdominal: Soft. Bowel sounds are normal. He exhibits no distension and no mass. There is no tenderness. There is no rebound and no guarding.    Genitourinary:       Infraumbilical scar and bilateral inguinal scars. There is a small reducible left inguinal hernia that does not extend into the scrotum. No adenopathy. No evidence of recurrent hernia on the right side.  Musculoskeletal: Normal range of motion. He exhibits no edema and no tenderness.  Lymphadenopathy:    He  has no cervical adenopathy.  Neurological: He is alert and oriented to person, place, and time. He has normal reflexes. Coordination normal.  Skin: Skin is warm and dry. No rash noted. He is not diaphoretic. No erythema. No pallor.  Psychiatric: He has a normal mood and affect. His behavior is normal. Judgment and thought content normal.    Data Reviewed  Dr. Benjaman Pott office notes. My old chart. Assessment    Recurrent left inguinal hernia.  Status post bilateral inguinal hernia repair in the remote past.  Status post laparoscopic repair right inguinal hernia 2009. No evidence of recurrence.  Status post endocardial ablation for atrial fibrillation/atrial flutter.  Daily aspirin use.    Plan    The patient was advised that he has a left inguinal hernia, and that repair is elected at some point. He is advised of the natural history of an inguinal hernia.  It was his preference to go ahead and have this repaired now before it gets much bigger. That is appropriate from a medical perspective.  He will be scheduled for open repair of his recurrent left inguinal  hernia with mesh.  I discussed the indications and details of the surgery with him. Risks and complications have been outlined, including but not limited to bleeding, infection, recurrence of the hernia, nerve damage with chronic pain, injury to adjacent organs such  as the testicle, bladder or intestine, cardiac pulmonary and thromboembolic problems. He understands these issues. His questions were answered. He agrees with the plan.  We will ask for cardiac clearance with Dr. Graciela Husbands. We will ask that we stop his aspirin 5 days preop.       Angelia Mould. Derrell Lolling, M.D., [facility] General and Minimally invasive Surgery Breast and Colorectal Surgery Office:   [phone] Pager:   [phone]  10/14/2011, 3:28 PM

## 2011-10-14 NOTE — Patient Instructions (Signed)
You have a recurrent left inguinal hernia. This will eventually need to be repaired electively. You have decided to go ahead with the surgery at this time. You will be scheduled for repair of the recurrent inguinal hernia with mesh in the near future.  Inguinal Hernia, Adult Muscles help keep everything in the body in its proper place. But if a weak spot in the muscles develops, something can poke through. That is called a hernia. When this happens in the lower part of the belly (abdomen), it is called an inguinal hernia. (It takes its name from a part of the body in this region called the inguinal canal.) A weak spot in the wall of muscles lets some fat or part of the small intestine bulge through. An inguinal hernia can develop at any age. Men get them more often than women. CAUSES  In adults, an inguinal hernia develops over time.  It can be triggered by:   Suddenly straining the muscles of the lower abdomen.   Lifting heavy objects.   Straining to have a bowel movement. Difficult bowel movements (constipation) can lead to this.   Constant coughing. This may be caused by smoking or lung disease.   Being overweight.   Being pregnant.   Working at a job that requires long periods of standing or heavy lifting.   Having had an inguinal hernia before.  One type can be an emergency situation. It is called a strangulated inguinal hernia. It develops if part of the small intestine slips through the weak spot and cannot get back into the abdomen. The blood supply can be cut off. If that happens, part of the intestine may die. This situation requires emergency surgery. SYMPTOMS  Often, a small inguinal hernia has no symptoms. It is found when a healthcare provider does a physical exam. Larger hernias usually have symptoms.   In adults, symptoms may include:   A lump in the groin. This is easier to see when the person is standing. It might disappear when lying down.   In men, a lump in the  scrotum.   Pain or burning in the groin. This occurs especially when lifting, straining or coughing.   A dull ache or feeling of pressure in the groin.   Signs of a strangulated hernia can include:   A bulge in the groin that becomes very painful and tender to the touch.   A bulge that turns red or purple.   Fever, nausea and vomiting.   Inability to have a bowel movement or to pass gas.  DIAGNOSIS  To decide if you have an inguinal hernia, a healthcare provider will probably do a physical examination.  This will include asking questions about any symptoms you have noticed.   The healthcare provider might feel the groin area and ask you to cough. If an inguinal hernia is felt, the healthcare provider may try to slide it back into the abdomen.   Usually no other tests are needed.  TREATMENT  Treatments can vary. The size of the hernia makes a difference. Options include:  Watchful waiting. This is often suggested if the hernia is small and you have had no symptoms.   No medical procedure will be done unless symptoms develop.   You will need to watch closely for symptoms. If any occur, contact your healthcare provider right away.   Surgery. This is used if the hernia is larger or you have symptoms.   Open surgery. This is usually an outpatient procedure (  you will not stay overnight in a hospital). An cut (incision) is made through the skin in the groin. The hernia is put back inside the abdomen. The weak area in the muscles is then repaired by herniorrhaphy or hernioplasty. Herniorrhaphy: in this type of surgery, the weak muscles are sewn back together. Hernioplasty: a patch or mesh is used to close the weak area in the abdominal wall.   Laparoscopy. In this procedure, a surgeon makes small incisions. A thin tube with a tiny video camera (called a laparoscope) is put into the abdomen. The surgeon repairs the hernia with mesh by looking with the video camera and using two long  instruments.  HOME CARE INSTRUCTIONS   After surgery to repair an inguinal hernia:   You will need to take pain medicine prescribed by your healthcare provider. Follow all directions carefully.   You will need to take care of the wound from the incision.   Your activity will be restricted for awhile. This will probably include no heavy lifting for several weeks. You also should not do anything too active for a few weeks. When you can return to work will depend on the type of job that you have.   During "watchful waiting" periods, you should:   Maintain a healthy weight.   Eat a diet high in fiber (fruits, vegetables and whole grains).   Drink plenty of fluids to avoid constipation. This means drinking enough water and other liquids to keep your urine clear or pale yellow.   Do not lift heavy objects.   Do not stand for long periods of time.   Quit smoking. This should keep you from developing a frequent cough.  SEEK MEDICAL CARE IF:   A bulge develops in your groin area.   You feel pain, a burning sensation or pressure in the groin. This might be worse if you are lifting or straining.   You develop a fever of more than 100.5 F (38.1 C).  SEEK IMMEDIATE MEDICAL CARE IF:   Pain in the groin increases suddenly.   A bulge in the groin gets bigger suddenly and does not go down.   For men, there is sudden pain in the scrotum. Or, the size of the scrotum increases.   A bulge in the groin area becomes red or purple and is painful to touch.   You have nausea or vomiting that does not go away.   You feel your heart beating much faster than normal.   You cannot have a bowel movement or pass gas.   You develop a fever of more than 102.0 F (38.9 C).  Document Released: 12/08/2008 Document Revised: 07/11/2011 Document Reviewed: 12/08/2008 [facility] Patient Information 2012 [place], [place].

## 2011-10-14 NOTE — Telephone Encounter (Signed)
Request for cardiac clearance faxed to Dr Graciela Husbands. Orders done and being held for clearance.

## 2011-10-21 ENCOUNTER — Ambulatory Visit (INDEPENDENT_AMBULATORY_CARE_PROVIDER_SITE_OTHER): Payer: Medicare Other | Admitting: Internal Medicine

## 2011-10-21 VITALS — BP 142/86 | HR 74 | Wt 221.8 lb

## 2011-10-21 DIAGNOSIS — Z01810 Encounter for preprocedural cardiovascular examination: Secondary | ICD-10-CM

## 2011-10-21 DIAGNOSIS — I4891 Unspecified atrial fibrillation: Secondary | ICD-10-CM

## 2011-10-21 NOTE — Assessment & Plan Note (Signed)
Pt is identified with atrial fibrillation  His Thromboembolic risk score is about 3-4 and as such should be on oral anticoagulation for the long term  Previously he felt much better in sinus vs atrial flutter so i am inclined to undertake DCCV following 3 weeks of antecedent anticoagulation with either Rivaroxaban or apixoban which we will start after his hernia surgery.   We will also assess LA dimension to help determine antiarrhythmic drug choice

## 2011-10-21 NOTE — Assessment & Plan Note (Signed)
Pt s functional status and relatively recent neg myoview suggest he should be at minimal risk for his hernia repair  Afterwards, though, he will need anticoagulation

## 2011-10-21 NOTE — Patient Instructions (Signed)
Your physician recommends that you schedule a follow-up appointment in: 3-4 WEEKS WITH Norma Fredrickson NP  Your physician has requested that you have an echocardiogram. Echocardiography is a painless test that uses sound waves to create images of your heart. It provides your doctor with information about the size and shape of your heart and how well your heart's chambers and valves are working. This procedure takes approximately one hour. There are no restrictions for this procedure.

## 2011-10-21 NOTE — Progress Notes (Signed)
CARDIOLOGY CONSULT NOTE  Patient ID: [name], MRN: [id number], DOB/AGE: 1945/10/26 66 y.o. Admit date: (Not on file) Date of Consult: 10/21/2011  Primary Physician: Lolita Patella, MD, MD Primary Cardiologist: SK  Chief Complaint: Preop evaluation   HPI [name] is a 66 y.o. male : that we are asked to see for preop eval for herniorraphy  He has known modest non obstructive disease from cath 2006 and neg myoview 2011 with normal LV function  He has a remote history of Aflutter for which he underwent cath ablation  2006  Also had PVCs.    Modest exercise intolerance that is non predictable  With abitility to exercise 1-2 hr. 3-4/week but sometimes SOB walking into gym. He has noted sign irregularity     TERF age-34, HTN-1, vasc dis-1, DM +/-  For score of 3-4  He is taking ASA    No edema  No history of TIA      Past Medical History  Diagnosis Date  . Hypertension   . Hyperlipidemia   . PVC's (premature ventricular contractions)   . CAD (coronary artery disease)     Mild 2 vessel disease per cath in 2006; myoview neg 2011  . Normal nuclear stress test 2011  . Atrial flutter     s/p ablation 2006  . Tobacco abuse   . Depression     Long standing  . AF (atrial fibrillation)       Surgical History:  Past Surgical History  Procedure Date  . Inguinal hernia repair 4098,1191  . Vasectomy   . Cardiac catheterization 11/1988, 12/07/2004    showing essentially normal left  ventricular function, moderate two-vessel disease (50% LAD after the third diagonal and a 60+% stenosis in the second obtuse marginal)         . Tee with cardioversion 2001, 2006    Invasive electrophysiology study, electroanatomical mapping,and radiofrequency catheter ablation. -- patient's arrhythmia was isthmus dependent flutter not withstanding the very long cycle length.  RF energy delivered across the cavotricuspid isthmus successfully interrupted conduction across the isthmus and  eliminated the patient's sub-straight. PHYSICIAN:  Duke Salvia, M.D  . Inguinal hernia repair 01/28/2008    Recurrent right inguinal hernia Laparoscopic, preperitoneal repair of recurrent right inguinal hernia with mesh -- SURGEON:  Dr. Claud Kelp.  . Foot surgery 1996    left     Home Meds: Prior to Admission medications   Medication Sig Start Date End Date Taking? Authorizing Provider  aspirin 325 MG tablet Take 650 mg by mouth daily.     Yes Historical Provider, MD  DULoxetine (CYMBALTA) 30 MG capsule Take 8 mg by mouth daily.    Yes Historical Provider, MD     Allergies:  Allergies  Allergen Reactions  . Horse-Derived Products     History   Social History  . Marital Status: Widowed    Spouse Name: N/A    Number of Children: N/A  . Years of Education: N/A   Occupational History  . Not on file.   Social History Main Topics  . Smoking status: Former Smoker -- 4 years    Types: Cigarettes    Quit date: 08/05/1966  . Smokeless tobacco: Not on file  . Alcohol Use: 1.2 oz/week    2 Shots of liquor per week  . Drug Use: No  . Sexually Active: No   Other Topics Concern  . Not on file   Social History Narrative  . No narrative  on file     Family History  Problem Relation Age of Onset  . Heart attack Father 75  . Cancer Father     colon  . Kidney disease Father     End Stage  . Atrial fibrillation Mother     flutter  . Heart failure Mother 7  . Cancer Sister     colon  . Cancer Paternal Uncle     lung  . Coronary artery disease Father     Had Valve Replacement     ROS:  Please see the history of present illness. And PMHx  All other systems reviewed and negative.    Physical Exam: Blood pressure 142/86, pulse 74, weight 221 lb 12.8 oz (100.608 kg). General: Well developed, well nourished male in no acute distress. Head: Normocephalic, atraumatic, sclera non-icteric, no xanthomas, nares are without discharge. Lymph Nodes:  none Neck: Negative  for carotid bruits. JVD not elevated. Lungs: Clear bilaterally to auscultation without wheezes, rales, or rhonchi. Breathing is unlabored. Heart: Irregulrly irregular     with    S1 S2. No murmurs, rubs, or gallops appreciated. Abdomen: Soft, non-tender, non-distended with normoactive bowel sounds. No hepatomegaly. No rebound/guarding. No obvious abdominal masses. Msk:  Strength and tone appear normal for age. Extremities: No clubbing or cyanosis. No edema.  Distal pedal pulses are 2+ and equal bilaterally. Skin: Warm and Dry Neuro: Alert and oriented X 3. CN III-XII intact Grossly normal sensory and motor function . Psych:  Responds to questions appropriately with a normal affect.      Labs: Cardiac Enzymes No results found for this basename: CKTOTAL:4,CKMB:4,TROPONINI:4 in the last 72 hours CBC Lab Results  Component Value Date   WBC 4.7 01/26/2008   HGB 16.0 01/26/2008   HCT 47.0 01/26/2008   MCV 95.6 01/26/2008   PLT 214 01/26/2008    EKG   Assessment and Plan:  Sherryl Manges

## 2011-10-24 ENCOUNTER — Telehealth (INDEPENDENT_AMBULATORY_CARE_PROVIDER_SITE_OTHER): Payer: Self-pay

## 2011-10-24 NOTE — Telephone Encounter (Signed)
Clearance note in epic. Surgery orders to surg schedulers 3-21.

## 2011-10-28 ENCOUNTER — Encounter: Payer: Medicare Other | Admitting: Internal Medicine

## 2011-10-30 ENCOUNTER — Encounter (HOSPITAL_COMMUNITY): Payer: Self-pay | Admitting: Pharmacy Technician

## 2011-11-04 ENCOUNTER — Other Ambulatory Visit: Payer: Self-pay

## 2011-11-04 ENCOUNTER — Ambulatory Visit (HOSPITAL_COMMUNITY): Payer: Medicare Other | Attending: Cardiology

## 2011-11-04 DIAGNOSIS — I4891 Unspecified atrial fibrillation: Secondary | ICD-10-CM | POA: Insufficient documentation

## 2011-11-04 DIAGNOSIS — I080 Rheumatic disorders of both mitral and aortic valves: Secondary | ICD-10-CM | POA: Insufficient documentation

## 2011-11-04 DIAGNOSIS — I079 Rheumatic tricuspid valve disease, unspecified: Secondary | ICD-10-CM | POA: Diagnosis not present

## 2011-11-05 ENCOUNTER — Encounter (HOSPITAL_COMMUNITY)
Admission: RE | Admit: 2011-11-05 | Discharge: 2011-11-05 | Disposition: A | Discharge status: Home / Self Care | Payer: Medicare Other | Source: Ambulatory Visit | Attending: General Surgery | Admitting: General Surgery

## 2011-11-05 ENCOUNTER — Encounter (HOSPITAL_COMMUNITY): Payer: Self-pay

## 2011-11-05 DIAGNOSIS — K4091 Unilateral inguinal hernia, without obstruction or gangrene, recurrent: Secondary | ICD-10-CM | POA: Diagnosis not present

## 2011-11-05 DIAGNOSIS — Z79899 Other long term (current) drug therapy: Secondary | ICD-10-CM | POA: Diagnosis not present

## 2011-11-05 DIAGNOSIS — F32A Depression, unspecified: Secondary | ICD-10-CM

## 2011-11-05 DIAGNOSIS — Z01812 Encounter for preprocedural laboratory examination: Secondary | ICD-10-CM | POA: Diagnosis not present

## 2011-11-05 DIAGNOSIS — I10 Essential (primary) hypertension: Secondary | ICD-10-CM | POA: Diagnosis not present

## 2011-11-05 DIAGNOSIS — E785 Hyperlipidemia, unspecified: Secondary | ICD-10-CM | POA: Diagnosis not present

## 2011-11-05 HISTORY — DX: Depression, unspecified: F32.A

## 2011-11-05 LAB — BASIC METABOLIC PANEL
CO2: 29 mEq/L (ref 19–32)
Calcium: 9.7 mg/dL (ref 8.4–10.5)
Chloride: 99 mEq/L (ref 96–112)
Creatinine, Ser: 0.95 mg/dL (ref 0.50–1.35)
Glucose, Bld: 83 mg/dL (ref 70–99)

## 2011-11-05 LAB — SURGICAL PCR SCREEN: MRSA, PCR: NEGATIVE

## 2011-11-05 MED ORDER — CHLORHEXIDINE GLUCONATE 4 % EX LIQD
1.0000 "application " | Freq: Once | CUTANEOUS | Status: DC
  Filled 2011-11-05: qty 118

## 2011-11-05 NOTE — Patient Instructions (Signed)
[name]  11/05/2011   Your procedure is scheduled on: 4-3  -2013  Report to [facility] at    0630    AM.  Call this number if you have problems the morning of surgery: [phone]   Remember:   Do not eat food:After Midnight.  Donot  Drink alcohol beverages 24 hours of surgery.  Take these medicines the morning of surgery with A SIP OF WATER: Cymbalta   Do not wear jewelry, make-up or nail polish.  Do not wear lotions, powders, or perfumes. You may wear deodorant.  Do not shave 48 hours prior to surgery.(face and neck okay, no shaving of legs)  Do not bring valuables to the hospital.  Contacts, dentures or bridgework may not be worn into surgery.  Leave suitcase in the car. After surgery it may be brought to your room.  For patients admitted to the hospital, checkout time is 11:00 AM the day of discharge.   Patients discharged the day of surgery will not be allowed to drive home.  Name and phone number of your driver: son  Special Instructions: CHG Shower Use Special Wash: 1/2 bottle night before surgery and 1/2 bottle morning of surgery.(avoid face and genitals)   Please read over the following fact sheets that you were given: MRSA Information.

## 2011-11-05 NOTE — Pre-Procedure Instructions (Signed)
EKG 10-21-11 in Epic/ Echo 11-04-11 in Epic.W. Kennon Portela

## 2011-11-05 NOTE — H&P (Signed)
DONTAVIOUS Rivas     MRN: [id number]   Description: 66 year old male  Provider: Ernestene Mention, MD  Department: [facility]     Diagnoses     Recurrent left inguinal hernia   - Primary    550.91      Vitals     BP Pulse Temp(Src) Ht Wt BMI    136/92  57  97.6 F (36.4 C) (Temporal)  6\' 1"  (1.854 m)  221 lb (100.245 kg)  29.16 kg/m2      History and Physical   Ernestene Mention, MD  d Patient ID: [name], male   DOB: [birth year], 66 y.o.   MRN: [id number]      HPI  [name] is a 66 y.o. male.  He is referred back to me by Dr. [name] for a recurrent left inguinal hernia.  This gentleman underwent repair of left inguinal hernia at age 72. He underwent open repair of right inguinal hernia in 1987. I repaired his recurrent right inguinal hernia in June 2009, a laparoscopic repair with mesh. He has done well with that surgery. Recent examination by Dr. Nicholos Johns reveals a left inguinal hernia. The patient states that he was not that symptomatic at that time but has become a little bit more symptomatic and now feels some discomfort in the left groin, but he does not feel a bulge.  Past history is significant for atrial fibrillation and atrial flutter, status post endocardial ablation, now followed by Dr. Berton Mount. This has been stable.  The patient now was to consider elective repair of his recurrent left inguinal hernia.     Past Medical History   Diagnosis  Date   .  Hypertension     .  Hyperlipidemia     .  Status post ablation of atrial flutter  2006   .  PVC's (premature ventricular contractions)     .  CAD (coronary artery disease)         Mild 3 vessel disease per cath in 2006   .  Normal nuclear stress test  2011       Past Surgical History   Procedure  Date   .  Inguinal hernia repair  1957   .  Inguinal hernia repair  1987   .  Vasectomy     .  Tee with cardioversion  10/04/1999       Transesophageal echocardiogram with cardioversion --  Essentially normal transesophageal echocardiogram with mild mitral insufficiency.  No thrombus is seen -- Successful elective cardioversion -- SURGEON:  Peter M. [place], M.D.   .  Cardiac catheterization  12/07/2004       showing essentially normal left  ventricular function, moderate two-vessel disease (50% LAD after the third diagonal and a 60+% stenosis in the second obtuse marginal)          .  Cardiac catheterization  11/1990   .  Tee with cardioversion  01/30/2005       Invasive electrophysiology study, electroanatomical mapping,and radiofrequency catheter ablation. -- patient's arrhythmia was isthmus dependent flutter not withstanding the very long cycle length.  RF energy delivered across the cavotricuspid isthmus successfully interrupted conduction across the isthmus and eliminated the patient's sub-straight. PHYSICIAN:  Duke Salvia, M.D   .  Inguinal hernia repair  01/28/2008       Recurrent right inguinal hernia Laparoscopic, preperitoneal repair of recurrent right inguinal hernia with mesh -- SURGEON:  Dr. Claud Kelp.   .  Cardiac ablation  2006       for atrial flutter   .  Hernia repair     .  Foot surgery  1996       left       Family History   Problem  Relation  Age of Onset   .  Heart attack  Father     .  Cancer  Father         colon   .  Kidney disease  Father     .  Atrial fibrillation  Mother         flutter   .  Heart failure  Mother     .  Cancer  Sister         colon   .  Cancer  Paternal Uncle         lung      Social History History   Substance Use Topics   .  Smoking status:  Former Smoker -- 4 years       Types:  Cigarettes       Quit date:  08/05/1966   .  Smokeless tobacco:  Not on file   .  Alcohol Use:  1.2 oz/week       2 Shots of liquor per week       Allergies   Allergen  Reactions   .  Horse-Derived Products         Current Outpatient Prescriptions   Medication  Sig  Dispense  Refill   .  aspirin 325 MG tablet  Take 650 mg  by mouth daily.           .  DULoxetine (CYMBALTA) 30 MG capsule  Take 5-7 mg by mouth daily.              Review of Systems   Constitutional: Negative for fever, chills and unexpected weight change.  HENT: Negative for hearing loss, congestion, sore throat, trouble swallowing and voice change.   Eyes: Negative for visual disturbance.  Respiratory: Negative for cough and wheezing.   Cardiovascular: Positive for palpitations. Negative for chest pain and leg swelling.  Gastrointestinal: Negative for nausea, vomiting, abdominal pain, diarrhea, constipation, blood in stool, abdominal distention, anal bleeding and rectal pain.  Genitourinary: Negative for hematuria, discharge and difficulty urinating.  Musculoskeletal: Negative for arthralgias.  Skin: Negative for rash and wound.  Neurological: Negative for seizures, syncope, weakness and headaches.  Hematological: Negative for adenopathy. Does not bruise/bleed easily.  Psychiatric/Behavioral: Negative for confusion.    Blood pressure 136/92, pulse 57, temperature 97.6 F (36.4 C), temperature source Temporal, height 6\' 1"  (1.854 m), weight 221 lb (100.245 kg), SpO2 97.00%.   Physical Exam  Constitutional: He is oriented to person, place, and time. He appears well-developed and well-nourished. No distress.  HENT:   Head: Normocephalic.   Nose: Nose normal.   Mouth/Throat: No oropharyngeal exudate.  Eyes: Conjunctivae and EOM are normal. Pupils are equal, round, and reactive to light. Right eye exhibits no discharge. Left eye exhibits no discharge. No scleral icterus.  Neck: Normal range of motion. Neck supple. No JVD present. No tracheal deviation present. No thyromegaly present.  Cardiovascular: Normal rate, regular rhythm, normal heart sounds and intact distal pulses.    No murmur heard. Pulmonary/Chest: Effort normal and breath sounds normal. No stridor. No respiratory distress. He has no wheezes. He has no rales. He exhibits no  tenderness.  Abdominal: Soft. Bowel sounds are normal. He exhibits no  distension and no mass. There is no tenderness. There is no rebound and no guarding.  Genitourinary:       Infraumbilical scar and bilateral inguinal scars. There is a small reducible left inguinal hernia that does not extend into the scrotum. No adenopathy. No evidence of recurrent hernia on the right side.  Musculoskeletal: Normal range of motion. He exhibits no edema and no tenderness.  Lymphadenopathy:    He has no cervical adenopathy.  Neurological: He is alert and oriented to person, place, and time. He has normal reflexes. Coordination normal.  Skin: Skin is warm and dry. No rash noted. He is not diaphoretic. No erythema. No pallor.  Psychiatric: He has a normal mood and affect. His behavior is normal. Judgment and thought content normal.      Assessment   Recurrent left inguinal hernia.  Status post bilateral inguinal hernia repair in the remote past.  Status post laparoscopic repair right inguinal hernia 2009. No evidence of recurrence.  Status post endocardial ablation for atrial fibrillation/atrial flutter.  Daily aspirin use.   Plan The patient was advised that he has a left inguinal hernia, and that repair is elected at some point. He is advised of the natural history of an inguinal hernia.  It was his preference to go ahead and have this repaired now before it gets much bigger. That is appropriate from a medical perspective.  He will be scheduled for open repair of his recurrent left inguinal hernia with mesh.  I discussed the indications and details of the surgery with him. Risks and complications have been outlined, including but not limited to bleeding, infection, recurrence of the hernia, nerve damage with chronic pain, injury to adjacent organs such  as the testicle, bladder or intestine, cardiac pulmonary and thromboembolic problems. He understands these issues. His questions were answered. He  agrees with the plan.  We will ask for cardiac clearance with Dr. Graciela Husbands. We will ask that we stop his aspirin 5 days preop.       Angelia Mould. Derrell Lolling, M.D., [facility] General and Minimally invasive Surgery Breast and Colorectal Surgery Office:   [phone] Pager:   [phone]

## 2011-11-06 ENCOUNTER — Encounter (HOSPITAL_COMMUNITY): Payer: Self-pay | Admitting: Anesthesiology

## 2011-11-06 ENCOUNTER — Encounter (HOSPITAL_COMMUNITY): Payer: Self-pay | Admitting: *Deleted

## 2011-11-06 ENCOUNTER — Encounter (HOSPITAL_COMMUNITY)
Admission: RE | Disposition: A | Discharge status: Home / Self Care | Payer: Self-pay | Source: Ambulatory Visit | Attending: General Surgery

## 2011-11-06 ENCOUNTER — Ambulatory Visit (HOSPITAL_COMMUNITY)
Admission: RE | Admit: 2011-11-06 | Discharge: 2011-11-06 | Disposition: A | Discharge status: Home / Self Care | Payer: Medicare Other | Source: Ambulatory Visit | Attending: General Surgery | Admitting: General Surgery

## 2011-11-06 ENCOUNTER — Ambulatory Visit (HOSPITAL_COMMUNITY): Payer: Medicare Other | Admitting: Anesthesiology

## 2011-11-06 DIAGNOSIS — Z01812 Encounter for preprocedural laboratory examination: Secondary | ICD-10-CM | POA: Insufficient documentation

## 2011-11-06 DIAGNOSIS — K4091 Unilateral inguinal hernia, without obstruction or gangrene, recurrent: Secondary | ICD-10-CM | POA: Diagnosis not present

## 2011-11-06 DIAGNOSIS — I10 Essential (primary) hypertension: Secondary | ICD-10-CM | POA: Insufficient documentation

## 2011-11-06 DIAGNOSIS — K4090 Unilateral inguinal hernia, without obstruction or gangrene, not specified as recurrent: Secondary | ICD-10-CM

## 2011-11-06 DIAGNOSIS — E785 Hyperlipidemia, unspecified: Secondary | ICD-10-CM | POA: Diagnosis not present

## 2011-11-06 DIAGNOSIS — Z79899 Other long term (current) drug therapy: Secondary | ICD-10-CM | POA: Insufficient documentation

## 2011-11-06 HISTORY — PX: INGUINAL HERNIA REPAIR: SHX194

## 2011-11-06 SURGERY — REPAIR, HERNIA, INGUINAL, ADULT
Anesthesia: General | Laterality: Left | Wound class: Clean

## 2011-11-06 MED ORDER — NEOSTIGMINE METHYLSULFATE 1 MG/ML IJ SOLN
INTRAMUSCULAR | Status: DC | PRN
  Administered 2011-11-06: 5 mg via INTRAVENOUS

## 2011-11-06 MED ORDER — OXYCODONE-ACETAMINOPHEN 10-325 MG PO TABS: 1.0000 | ORAL_TABLET | ORAL | Status: DC | PRN

## 2011-11-06 MED ORDER — ACETAMINOPHEN 325 MG PO TABS: 650.0000 mg | ORAL_TABLET | ORAL | Status: DC | PRN

## 2011-11-06 MED ORDER — SODIUM CHLORIDE 0.9 % IJ SOLN: 3.0000 mL | INTRAMUSCULAR | Status: DC | PRN

## 2011-11-06 MED ORDER — EPHEDRINE SULFATE 50 MG/ML IJ SOLN
INTRAMUSCULAR | Status: DC | PRN
  Administered 2011-11-06: 10 mg via INTRAVENOUS
  Administered 2011-11-06: 5 mg via INTRAVENOUS

## 2011-11-06 MED ORDER — LACTATED RINGERS IV SOLN: INTRAVENOUS | Status: DC

## 2011-11-06 MED ORDER — FENTANYL CITRATE 0.05 MG/ML IJ SOLN
INTRAMUSCULAR | Status: DC | PRN
  Administered 2011-11-06 (×2): 50 ug via INTRAVENOUS

## 2011-11-06 MED ORDER — LIDOCAINE HCL (CARDIAC) 20 MG/ML IV SOLN
INTRAVENOUS | Status: DC | PRN
  Administered 2011-11-06: 100 mg via INTRAVENOUS

## 2011-11-06 MED ORDER — CEFAZOLIN SODIUM-DEXTROSE 2-3 GM-% IV SOLR
INTRAVENOUS | Status: AC
  Filled 2011-11-06: qty 50

## 2011-11-06 MED ORDER — MEPERIDINE HCL 50 MG/ML IJ SOLN: 6.2500 mg | INTRAMUSCULAR | Status: DC | PRN

## 2011-11-06 MED ORDER — 0.9 % SODIUM CHLORIDE (POUR BTL) OPTIME
TOPICAL | Status: DC | PRN
  Administered 2011-11-06: 1000 mL

## 2011-11-06 MED ORDER — MORPHINE SULFATE 10 MG/ML IJ SOLN: 2.0000 mg | INTRAMUSCULAR | Status: DC | PRN

## 2011-11-06 MED ORDER — SUCCINYLCHOLINE CHLORIDE 20 MG/ML IJ SOLN
INTRAMUSCULAR | Status: DC | PRN
  Administered 2011-11-06: 100 mg via INTRAVENOUS

## 2011-11-06 MED ORDER — ROCURONIUM BROMIDE 100 MG/10ML IV SOLN
INTRAVENOUS | Status: DC | PRN
  Administered 2011-11-06: 10 mg via INTRAVENOUS
  Administered 2011-11-06: 40 mg via INTRAVENOUS

## 2011-11-06 MED ORDER — ONDANSETRON HCL 4 MG/2ML IJ SOLN
INTRAMUSCULAR | Status: DC | PRN
  Administered 2011-11-06: 4 mg via INTRAVENOUS

## 2011-11-06 MED ORDER — PROPOFOL 10 MG/ML IV EMUL
INTRAVENOUS | Status: DC | PRN
  Administered 2011-11-06: 200 mg via INTRAVENOUS

## 2011-11-06 MED ORDER — ACETAMINOPHEN 10 MG/ML IV SOLN
INTRAVENOUS | Status: AC
  Filled 2011-11-06: qty 100

## 2011-11-06 MED ORDER — ACETAMINOPHEN 10 MG/ML IV SOLN
INTRAVENOUS | Status: DC | PRN
  Administered 2011-11-06 (×2): 1000 mg via INTRAVENOUS

## 2011-11-06 MED ORDER — HYDROMORPHONE HCL PF 1 MG/ML IJ SOLN: 0.2500 mg | INTRAMUSCULAR | Status: DC | PRN

## 2011-11-06 MED ORDER — SODIUM CHLORIDE 0.9 % IV SOLN: 250.0000 mL | INTRAVENOUS | Status: DC | PRN

## 2011-11-06 MED ORDER — ACETAMINOPHEN 650 MG RE SUPP
650.0000 mg | RECTAL | Status: DC | PRN
  Filled 2011-11-06: qty 1

## 2011-11-06 MED ORDER — HEPARIN SODIUM (PORCINE) 5000 UNIT/ML IJ SOLN
5000.0000 [IU] | Freq: Once | INTRAMUSCULAR | Status: AC
  Administered 2011-11-06: 5000 [IU] via SUBCUTANEOUS

## 2011-11-06 MED ORDER — HEPARIN SODIUM (PORCINE) 5000 UNIT/ML IJ SOLN
INTRAMUSCULAR | Status: AC
  Filled 2011-11-06: qty 1

## 2011-11-06 MED ORDER — OXYCODONE-ACETAMINOPHEN 5-325 MG PO TABS
ORAL_TABLET | ORAL | Status: AC
  Administered 2011-11-06: 1 via ORAL
  Filled 2011-11-06: qty 1

## 2011-11-06 MED ORDER — OXYCODONE HCL 5 MG PO TABS: 5.0000 mg | ORAL_TABLET | ORAL | Status: DC | PRN

## 2011-11-06 MED ORDER — SODIUM CHLORIDE 0.9 % IJ SOLN: 3.0000 mL | Freq: Two times a day (BID) | INTRAMUSCULAR | Status: DC

## 2011-11-06 MED ORDER — SODIUM CHLORIDE 0.9 % IJ SOLN
INTRAMUSCULAR | Status: DC | PRN
  Administered 2011-11-06: 20 mL

## 2011-11-06 MED ORDER — MIDAZOLAM HCL 5 MG/5ML IJ SOLN
INTRAMUSCULAR | Status: DC | PRN
  Administered 2011-11-06: 2 mg via INTRAVENOUS

## 2011-11-06 MED ORDER — LACTATED RINGERS IV SOLN
INTRAVENOUS | Status: DC | PRN
  Administered 2011-11-06: 08:00:00 via INTRAVENOUS

## 2011-11-06 MED ORDER — PROMETHAZINE HCL 25 MG/ML IJ SOLN: 6.2500 mg | INTRAMUSCULAR | Status: DC | PRN

## 2011-11-06 MED ORDER — BUPIVACAINE LIPOSOME 1.3 % IJ SUSP
20.0000 mL | INTRAMUSCULAR | Status: AC
  Administered 2011-11-06: 25 mL
  Filled 2011-11-06: qty 20

## 2011-11-06 MED ORDER — GLYCOPYRROLATE 0.2 MG/ML IJ SOLN
INTRAMUSCULAR | Status: DC | PRN
  Administered 2011-11-06: .8 mg via INTRAVENOUS

## 2011-11-06 MED ORDER — ONDANSETRON HCL 4 MG/2ML IJ SOLN: 4.0000 mg | Freq: Four times a day (QID) | INTRAMUSCULAR | Status: DC | PRN

## 2011-11-06 MED ORDER — CEFAZOLIN SODIUM-DEXTROSE 2-3 GM-% IV SOLR
2.0000 g | INTRAVENOUS | Status: AC
  Administered 2011-11-06: 2 g via INTRAVENOUS

## 2011-11-06 MED ORDER — SODIUM CHLORIDE 0.9 % IV SOLN: INTRAVENOUS | Status: DC

## 2011-11-06 SURGICAL SUPPLY — 48 items
ADH SKN CLS APL DERMABOND .7 (GAUZE/BANDAGES/DRESSINGS) ×2
APL SKNCLS STERI-STRIP NONHPOA (GAUZE/BANDAGES/DRESSINGS)
BENZOIN TINCTURE PRP APPL 2/3 (GAUZE/BANDAGES/DRESSINGS) IMPLANT
BLADE HEX COATED 2.75 (ELECTRODE) ×2 IMPLANT
BLADE SURG 15 STRL LF DISP TIS (BLADE) ×1 IMPLANT
BLADE SURG 15 STRL SS (BLADE) ×2
BLADE SURG SZ10 CARB STEEL (BLADE) ×1 IMPLANT
CANISTER SUCTION 2500CC (MISCELLANEOUS) ×2 IMPLANT
CHLORAPREP W/TINT 26ML (MISCELLANEOUS) ×1 IMPLANT
CLOTH BEACON ORANGE TIMEOUT ST (SAFETY) ×2 IMPLANT
DECANTER SPIKE VIAL GLASS SM (MISCELLANEOUS) ×2 IMPLANT
DERMABOND ADVANCED (GAUZE/BANDAGES/DRESSINGS) ×2
DERMABOND ADVANCED .7 DNX12 (GAUZE/BANDAGES/DRESSINGS) IMPLANT
DRAIN PENROSE 18X1/2 LTX STRL (DRAIN) ×1 IMPLANT
DRAPE LAPAROTOMY TRNSV 102X78 (DRAPE) ×2 IMPLANT
ELECT REM PT RETURN 9FT ADLT (ELECTROSURGICAL) ×2
ELECTRODE REM PT RTRN 9FT ADLT (ELECTROSURGICAL) ×1 IMPLANT
GLOVE BIOGEL PI IND STRL 7.0 (GLOVE) ×1 IMPLANT
GLOVE BIOGEL PI INDICATOR 7.0 (GLOVE) ×1
GLOVE EUDERMIC 7 POWDERFREE (GLOVE) ×2 IMPLANT
GOWN STRL NON-REIN LRG LVL3 (GOWN DISPOSABLE) ×2 IMPLANT
GOWN STRL REIN XL XLG (GOWN DISPOSABLE) ×4 IMPLANT
KIT BASIN OR (CUSTOM PROCEDURE TRAY) ×2 IMPLANT
NDL HYPO 25X1 1.5 SAFETY (NEEDLE) ×1 IMPLANT
NEEDLE HYPO 25X1 1.5 SAFETY (NEEDLE) ×2 IMPLANT
NS IRRIG 1000ML POUR BTL (IV SOLUTION) ×2 IMPLANT
PACK BASIC VI WITH GOWN DISP (CUSTOM PROCEDURE TRAY) ×2 IMPLANT
PENCIL BUTTON HOLSTER BLD 10FT (ELECTRODE) ×2 IMPLANT
SPONGE GAUZE 4X4 12PLY (GAUZE/BANDAGES/DRESSINGS) IMPLANT
SPONGE LAP 4X18 X RAY DECT (DISPOSABLE) ×3 IMPLANT
STAPLER VISISTAT 35W (STAPLE) IMPLANT
STRIP CLOSURE SKIN 1/2X4 (GAUZE/BANDAGES/DRESSINGS) IMPLANT
SUT MNCRL AB 4-0 PS2 18 (SUTURE) ×2 IMPLANT
SUT PROLENE 2 0 CT2 30 (SUTURE) ×6 IMPLANT
SUT SILK 2 0 (SUTURE) ×2
SUT SILK 2 0 SH (SUTURE) IMPLANT
SUT SILK 2-0 18XBRD TIE 12 (SUTURE) ×1 IMPLANT
SUT VIC AB 2-0 SH 27 (SUTURE) ×2
SUT VIC AB 2-0 SH 27X BRD (SUTURE) ×1 IMPLANT
SUT VIC AB 3-0 SH 27 (SUTURE) ×2
SUT VIC AB 3-0 SH 27XBRD (SUTURE) ×1 IMPLANT
SUT VICRYL 2 0 18  UND BR (SUTURE)
SUT VICRYL 2 0 18 UND BR (SUTURE) IMPLANT
SYR BULB IRRIGATION 50ML (SYRINGE) ×2 IMPLANT
SYR CONTROL 10ML LL (SYRINGE) ×2 IMPLANT
TOWEL OR 17X26 10 PK STRL BLUE (TOWEL DISPOSABLE) ×3 IMPLANT
YANKAUER SUCT BULB TIP 10FT TU (MISCELLANEOUS) IMPLANT
bard soft mesh ×1 IMPLANT

## 2011-11-06 NOTE — Interval H&P Note (Signed)
History and Physical Interval Note:  11/06/2011 8:29 AM  [name]  has presented today for surgery, with the diagnosis of left inguinal hernia  The goals of treatment and the  various methods of treatment have been discussed with the patient and family. After consideration of risks, benefits and other options for treatment, the patient has consented to  Procedure(s) (LRB): HERNIA REPAIR RECURRENT  INGUINAL ADULT (Left) INSERTION OF MESH (Left) as a surgical intervention .  The patients' history has been reviewed today, patient examined today, no change in status, stable for surgery.  I have reviewed the patients' chart and labs.  Questions were answered to the patient's satisfaction.     Ernestene Mention

## 2011-11-06 NOTE — Anesthesia Postprocedure Evaluation (Signed)
  Anesthesia Post-op Note  Patient: [name]  Procedure(s) Performed: Procedure(s) (LRB): HERNIA REPAIR INGUINAL ADULT (Left) INSERTION OF MESH (Left)  Patient Location: PACU  Anesthesia Type: General  Level of Consciousness: awake and alert   Airway and Oxygen Therapy: Patient Spontanous Breathing  Post-op Pain: mild  Post-op Assessment: Post-op Vital signs reviewed, Patient's Cardiovascular Status Stable, Respiratory Function Stable, Patent Airway and No signs of Nausea or vomiting  Post-op Vital Signs: stable  Complications: No apparent anesthesia complications

## 2011-11-06 NOTE — Anesthesia Preprocedure Evaluation (Addendum)
Anesthesia Evaluation  Patient identified by MRN, date of birth, ID band Patient awake    Reviewed: Allergy & Precautions, H&P , NPO status , Patient's Chart, lab work & pertinent test results  Airway Mallampati: III TM Distance: >3 FB Neck ROM: Full    Dental No notable dental hx.    Pulmonary neg pulmonary ROS,  breath sounds clear to auscultation  Pulmonary exam normal       Cardiovascular hypertension, + CAD (non-obstructive 2 vessle disease) negative cardio ROS  + dysrhythmias (s/p ablation 2006 for flutter. recent afib. RBBB) Atrial Fibrillation Rhythm:Regular Rate:Normal     Neuro/Psych negative neurological ROS  negative psych ROS   GI/Hepatic negative GI ROS, Neg liver ROS,   Endo/Other  negative endocrine ROS  Renal/GU negative Renal ROS  negative genitourinary   Musculoskeletal negative musculoskeletal ROS (+)   Abdominal   Peds negative pediatric ROS (+)  Hematology negative hematology ROS (+)   Anesthesia Other Findings   Reproductive/Obstetrics negative OB ROS                          Anesthesia Physical Anesthesia Plan  ASA: III  Anesthesia Plan: General   Post-op Pain Management:    Induction: Intravenous  Airway Management Planned: Oral ETT  Additional Equipment:   Intra-op Plan:   Post-operative Plan: Extubation in OR  Informed Consent: I have reviewed the patients History and Physical, chart, labs and discussed the procedure including the risks, benefits and alternatives for the proposed anesthesia with the patient or authorized representative who has indicated his/her understanding and acceptance.   Dental advisory given  Plan Discussed with: CRNA  Anesthesia Plan Comments:        Anesthesia Quick Evaluation

## 2011-11-06 NOTE — Preoperative (Signed)
Beta Blockers   Reason not to administer Beta Blockers:Not Applicable 

## 2011-11-06 NOTE — Op Note (Signed)
Patient Name:           [name]   Date of Surgery:        11/06/2011  Pre op Diagnosis:      Recurrent left inguinal hernia  Post op Diagnosis:    Recurrent left inguinal hernia, direct type  Procedure:                 Repair recurrent left inguinal hernia with mesh Armanda Heritage repair)  Surgeon:                     Angelia Mould. Derrell Lolling, M.D., FACS  Assistant:                      none  Operative Indications:   This is a 66 year old male who underwent repair of a left inguinal hernia at age 58. He has noticed a bulge and  discomfort in his left groin recently. He has had hernias on the right side repaired. He wants to have this repaired at this time. His exam shows that he has a reducible left inguinal hernia that does not extend into the scrotum.  Operative Findings:       There was chronic scar tissue in the left inguinal canal consistent with prior hernia repair. There was a direct hernia defect medially coming out of the floor of the inguinal canal. There was no evidence of indirect hernia.  Procedure in Detail:          Following the induction of general endotracheal anesthesia the patient's lower abdomen, groin and genitalia were prepped and draped in a sterile fashion. Intravenous antibiotics were given. Surgical time out was performed. A 50% solution of Exparel local anesthetic was injected into the subcutaneous tissue and ultimately into the muscle layers. A transverse, slightly oblique incision was made in the left groin through the old scar. Dissection was carried down through the subcutaneous tissue exposing the aponeurosis of the external oblique. I identified the external inguinal ring. I incised the external oblique and slowly dissected it away from the underlying structures so I could place a self-retaining retractors.   I could identify the inguinal ligament inferiorly and the pubic tubercle medially. I mobilized the cord structures and encircled them with a Penrose drain. I  dissected the hernia sac medially away from the cord structures. I reduced the hernia sac and imbricated the soft tissues with a running suture of 2-0 Vicryl so as to hold the hernia bulge reduced during the repair and I examined the cord and there was no evidence of indirect hernia. I brought a 3" x 6" polypropylene mesh to the field. This was the newer thin mesh. This was trimmed at  the corners to fit the wound. The mesh was sutured in place with running and interrupted sutures of 2-0 Prolene. The mesh was sutured so as to generously overlap the fascia at the pubic tubercle. I did a running suture of Prolene along the inguinal ligament inferiorly. The mesh was incised laterally so as to wrap around the cord structures at the internal ring. Medially,superiorly and superolaterally I placed several mattress sutures of 2-0 Prolene to secure the mesh. The tails of the mesh were overlapped laterally and several other sutures were placed. This provided very secure repair both medial and lateral to the internal ring allowing a fingertip opening for the cord structures. I was satisfied with the repair. There was no bleeding. The wound was irrigated with saline.  The external oblique was closed with a running suture of 2-0 Vicryl placed in the cord structures deep to the external oblique. Scarpa's fascia was closed with 3-0 Vicryl sutures and the skin closed with a running subcuticular suture of 4-0 Monocryl and Dermabond. The patient was taken to recovery in a stable condition. EBL 10 cc. Counts correct. Complications none.     Angelia Mould. Derrell Lolling, M.D., FACS General and Minimally Invasive Surgery Breast and Colorectal Surgery  11/06/2011 9:57 AM

## 2011-11-06 NOTE — Transfer of Care (Signed)
Immediate Anesthesia Transfer of Care Note  Patient: [name]  Procedure(s) Performed: Procedure(s) (LRB): HERNIA REPAIR INGUINAL ADULT (Left) INSERTION OF MESH (Left)  Patient Location: PACU  Anesthesia Type: General  Level of Consciousness: awake, alert , oriented and patient cooperative  Airway & Oxygen Therapy: Patient Spontanous Breathing and Patient connected to face mask oxygen  Post-op Assessment: Report given to PACU RN, Post -op Vital signs reviewed and stable and Patient moving all extremities  Post vital signs: Reviewed and stable  Complications: No apparent anesthesia complications

## 2011-11-06 NOTE — Discharge Instructions (Signed)
[facility]  UMBILICAL OR INGUINAL HERNIA REPAIR: POST OP INSTRUCTIONS  Always review your discharge instruction sheet given to you by the facility where your surgery was performed. IF YOU HAVE DISABILITY OR FAMILY LEAVE FORMS, YOU MUST BRING THEM TO THE OFFICE FOR PROCESSING.   DO NOT GIVE THEM TO YOUR DOCTOR.  1. A  prescription for pain medication may be given to you upon discharge.  Take your pain medication as prescribed, if needed.  If narcotic pain medicine is not needed, then you may take acetaminophen (Tylenol) or ibuprofen (Advil) as needed. 2. Take your usually prescribed medications unless otherwise directed. 3. If you need a refill on your pain medication, please contact your pharmacy.  They will contact our office to request authorization. Prescriptions will not be filled after 5 pm or on week-ends. 4. You should follow a light diet the first 24 hours after arrival home, such as soup and crackers, etc.  Be sure to include lots of fluids daily.  Resume your normal diet the day after surgery. 5. Most patients will experience some swelling and bruising around the umbilicus or in the groin and scrotum.  Ice packs and reclining will help.  Swelling and bruising can take several days to resolve.  6. It is common to experience some constipation if taking pain medication after surgery.  Increasing fluid intake and taking a stool softener (such as Colace) will usually help or prevent this problem from occurring.  A mild laxative (Milk of Magnesia or Miralax) should be taken according to package directions if there are no bowel movements after 48 hours. 7. Unless discharge instructions indicate otherwise, you may remove your bandages 24-48 hours after surgery, and you may shower at that time.  You may have steri-strips (small skin tapes) in place directly over the incision.  These strips should be left on the skin for 7-10 days.  If your surgeon used skin glue on the  incision, you may shower in 24 hours.  The glue will flake off over the next 2-3 weeks.  Any sutures or staples will be removed at the office during your follow-up visit. 8. ACTIVITIES:  You may resume regular (light) daily activities beginning the next day--such as daily self-care, walking, climbing stairs--gradually increasing activities as tolerated.  You may have sexual intercourse when it is comfortable.  Refrain from any heavy lifting or straining until approved by your doctor. a. You may drive when you are no longer taking prescription pain medication, you can comfortably wear a seatbelt, and you can safely maneuver your car and apply brakes. b. RETURN TO WORK:  __________________________________________________________ 9. You should see your doctor in the office for a follow-up appointment approximately 2-3 weeks after your surgery.  Make sure that you call for this appointment within a day or two after you arrive home to insure a convenient appointment time. 10. OTHER INSTRUCTIONS:  __________________________________________________________________________________________________________________________________________________________________________________________  WHEN TO CALL YOUR DOCTOR: 1. Fever over 101.0 2. Inability to urinate 3. Nausea and/or vomiting 4. Extreme swelling or bruising 5. Continued bleeding from incision. 6. Increased pain, redness, or drainage from the incision  The clinic staff is available to answer your questions during regular business hours.  Please don't hesitate to call and ask to speak to one of the nurses for clinical concerns.  If you have a medical emergency, go to the nearest emergency room or call 911.  A surgeon from [facility] is always on call at the hospital     [address], [address], [place], [place]  [address] ?  [address] [address], [place], [place]   [address] [phone] ? [phone] ? FAX [phone] Web site:  [email or web address]  

## 2011-11-06 NOTE — Progress Notes (Signed)
Lt groin incision clean, dry and intact with liquid skin adhesive.

## 2011-11-08 ENCOUNTER — Telehealth (INDEPENDENT_AMBULATORY_CARE_PROVIDER_SITE_OTHER): Payer: Self-pay | Admitting: General Surgery

## 2011-11-08 NOTE — Telephone Encounter (Signed)
Pt calling to report S/P inguinal hernia repair 11/06/11 per Dr. Derrell Lolling.  He is experiencing postop gas and constipation, and asking for advice.  Reassured pt that while uncomfortable, this is most common complaint.  Advised to walk AMAP and try Miralax.  OK to take Gas-X, but walking to pass gas is easiest.  Also discussed the fact his pain meds are adding to constipation, so take them with that knowledge.  He will call back prn.

## 2011-11-08 NOTE — Telephone Encounter (Signed)
Pt calling again about his constipation.  He cannot hold down the Miralax.  Advised pt to try a Fleets enema, holding it in as long as possible to get the best results.  He also reports his scrotum is very swollen and "squishy."  Reassured pt the edema is not at all uncommon after hernia repair.  When sitting, try to elevate the scrotum.  Swelling will resolve over time.  He will try the enema and call back prn.

## 2011-11-11 ENCOUNTER — Ambulatory Visit (INDEPENDENT_AMBULATORY_CARE_PROVIDER_SITE_OTHER): Payer: Medicare Other | Admitting: Nurse Practitioner

## 2011-11-11 ENCOUNTER — Encounter: Payer: Self-pay | Admitting: Nurse Practitioner

## 2011-11-11 VITALS — BP 124/80 | HR 70 | Ht 73.0 in | Wt 217.0 lb

## 2011-11-11 DIAGNOSIS — K4091 Unilateral inguinal hernia, without obstruction or gangrene, recurrent: Secondary | ICD-10-CM | POA: Diagnosis not present

## 2011-11-11 DIAGNOSIS — I4891 Unspecified atrial fibrillation: Secondary | ICD-10-CM

## 2011-11-11 DIAGNOSIS — I10 Essential (primary) hypertension: Secondary | ICD-10-CM | POA: Diagnosis not present

## 2011-11-11 DIAGNOSIS — I2510 Atherosclerotic heart disease of native coronary artery without angina pectoris: Secondary | ICD-10-CM | POA: Diagnosis not present

## 2011-11-11 LAB — CBC WITH DIFFERENTIAL/PLATELET
Basophils Absolute: 0 10*3/uL (ref 0.0–0.1)
Basophils Relative: 0.7 % (ref 0.0–3.0)
Eosinophils Absolute: 0.2 10*3/uL (ref 0.0–0.7)
Eosinophils Relative: 2.9 % (ref 0.0–5.0)
HCT: 45.2 % (ref 39.0–52.0)
Hemoglobin: 15.1 g/dL (ref 13.0–17.0)
Lymphocytes Relative: 28 % (ref 12.0–46.0)
Lymphs Abs: 1.7 10*3/uL (ref 0.7–4.0)
MCHC: 33.4 g/dL (ref 30.0–36.0)
MCV: 99.3 fl (ref 78.0–100.0)
Monocytes Absolute: 0.5 10*3/uL (ref 0.1–1.0)
Monocytes Relative: 8.7 % (ref 3.0–12.0)
Neutro Abs: 3.6 10*3/uL (ref 1.4–7.7)
Neutrophils Relative %: 59.7 % (ref 43.0–77.0)
Platelets: 260 10*3/uL (ref 150.0–400.0)
RBC: 4.55 Mil/uL (ref 4.22–5.81)
RDW: 13.1 % (ref 11.5–14.6)
WBC: 6 10*3/uL (ref 4.5–10.5)

## 2011-11-11 LAB — BASIC METABOLIC PANEL
BUN: 16 mg/dL (ref 6–23)
CO2: 29 mEq/L (ref 19–32)
Calcium: 9.2 mg/dL (ref 8.4–10.5)
Chloride: 99 mEq/L (ref 96–112)
Creatinine, Ser: 0.8 mg/dL (ref 0.4–1.5)
GFR: 103.79 mL/min (ref >60.00)
Glucose, Bld: 76 mg/dL (ref 70–99)
Potassium: 3.8 mEq/L (ref 3.5–5.1)
Sodium: 138 mEq/L (ref 135–145)

## 2011-11-11 MED ORDER — RIVAROXABAN 20 MG PO TABS: 20.0000 mg | ORAL_TABLET | Freq: Every day | ORAL | Status: DC

## 2011-11-11 NOTE — Assessment & Plan Note (Addendum)
[name] remains in atrial fib. Exact duration is unknown. Xarelto 20 mg daily is started today with full instruction. Samples were given. Will discuss with Dr. Graciela Husbands as to which antiarrhythmic he would like to start. I will tentatively see [name] back in 3 weeks with plans of arranging a cardioversion at that visit. Will check CBC and BMET today prior to starting medicines. Patient is agreeable to this plan and will call if any problems develop in the interim.   I have spoken with Dr. Graciela Husbands. We will plain on just undertaking plain cardioversion without antiarrhythmics in 3 to 4 weeks. Patient is notified.

## 2011-11-11 NOTE — Assessment & Plan Note (Signed)
Moderate CAD per remote cath in 2006. Last myoview in 2011.

## 2011-11-11 NOTE — Assessment & Plan Note (Signed)
Blood pressure looks good.  

## 2011-11-11 NOTE — Assessment & Plan Note (Signed)
He is now 5 days post op. No problems noted. Have started Xarelto.

## 2011-11-11 NOTE — Patient Instructions (Signed)
We are going to start you on Xarelto take one each day with your evening meal (largest meal of the day)  We are going to check some lab today  I will talk to Dr. Graciela Husbands about anti arrhythmic medicine for you  I will see you in 3 weeks.  Call the [facility] office at [phone] if you have any questions, problems or concerns.

## 2011-11-11 NOTE — Progress Notes (Signed)
[name] Date of Birth: Aug 16, 1945 Medical Record #[id number]  History of Present Illness: [name] is seen back today for a follow up visit. He is seen for Dr. Graciela Husbands. He has recurrent atrial fib. Was here last month for a pre op clearance visit and noted atrial fib. He has had his hernia surgery just last Wednesday and did ok. Other problems include moderate CAD per cath in 2006, HTN, HLD and depression.  He comes in today. He is doing well. He is a little sore and was constipated. No bleeding. No cardiac complaints. Does notice some intermittent shortness of breath that he attributed to going in and out of rhythm. No syncope. No chest pain. Plan as outlined by Dr. Graciela Husbands was to start anticoagulation following his surgery with plans for DCCV with antiarrhythmic therapy. He has had his echo showing normal LV function. LA size was good. Did have a somewhat enlarged right side which will need to be followed.   Current Outpatient Prescriptions on File Prior to Visit  Medication Sig Dispense Refill  . DULoxetine (CYMBALTA) 30 MG capsule Take 8 mg by mouth daily with breakfast. Pt open the capsule and puts a little in yogurt      . Rivaroxaban (XARELTO) 20 MG TABS Take 20 mg by mouth daily.  30 tablet  6    Allergies  Allergen Reactions  . Horse-Derived Products Hives    Past Medical History  Diagnosis Date  . Hypertension   . Hyperlipidemia   . CAD (coronary artery disease)     Mild 2 vessel disease per cath in 2006; myoview neg 2011  . Normal nuclear stress test 2011  . Tobacco abuse   . PVC's (premature ventricular contractions)   . Atrial flutter     s/p ablation 2006  . AF (atrial fibrillation)     recent new occurrence-will have further evaluation after surgery.-Dr. Graciela Husbands  . Depression 11-05-11    On Cymbalta  . Right ventricular enlargement     Mild noted echo 3/13    Past Surgical History  Procedure Date  . Inguinal hernia repair 01/28/2008    Recurrent right inguinal  hernia Laparoscopic, preperitoneal repair of recurrent right inguinal hernia with mesh -- SURGEON:  Dr. Claud Kelp.  . Vasectomy   . Cardiac catheterization 11/1988, 12/07/2004    showing essentially normal left  ventricular function, moderate two-vessel disease (50% LAD after the third diagonal and a 60+% stenosis in the second obtuse marginal)         . Tee with cardioversion 2001, 2006    Invasive electrophysiology study, electroanatomical mapping,and radiofrequency catheter ablation. -- patient's arrhythmia was isthmus dependent flutter not withstanding the very long cycle length.  RF energy delivered across the cavotricuspid isthmus successfully interrupted conduction across the isthmus and eliminated the patient's sub-straight. PHYSICIAN:  Duke Salvia, M.D  . Foot surgery 1996    left-retained hardware    History  Smoking status  . Former Smoker -- 4 years  . Types: Cigarettes  . Quit date: 08/05/1966  Smokeless tobacco  . Not on file    History  Alcohol Use  . 1.2 oz/week  . 2 Shots of liquor per week    Family History  Problem Relation Age of Onset  . Heart attack Father 37  . Cancer Father     colon  . Kidney disease Father     End Stage  . Atrial fibrillation Mother     flutter  . Heart failure  Mother 62  . Cancer Sister     colon  . Cancer Paternal Uncle     lung  . Coronary artery disease Father     Had Valve Replacement    Review of Systems: The review of systems is positive for recent hernia surgery. No cardiac complaints.  All other systems were reviewed and are negative.  Physical Exam: BP 124/80  Pulse 70  Ht 6\' 1"  (1.854 m)  Wt 217 lb (98.431 kg)  BMI 28.63 kg/m2 Patient is very pleasant and in no acute distress. Skin is warm and dry. Color is normal.  HEENT is unremarkable. Normocephalic/atraumatic. PERRL. Sclera are nonicteric. Neck is supple. No masses. No JVD. Lungs are clear. Cardiac exam shows an irregular rhythm. Rate is controlled.  Abdomen is soft. Extremities are without edema. Gait and ROM are intact. No gross neurologic deficits noted.   LABORATORY DATA: EKG today shows atrial fib. Rate is controlled at 68 bpm. Does have PVC's noted. R BBB noted.   Assessment / Plan:

## 2011-11-12 ENCOUNTER — Telehealth: Payer: Self-pay | Admitting: *Deleted

## 2011-11-12 NOTE — Telephone Encounter (Signed)
Advised patient of results.  

## 2011-11-12 NOTE — Telephone Encounter (Signed)
Message copied by Royanne Foots on Tue Nov 12, 2011 11:31 AM ------      Message from: Rosalio Macadamia      Created: Mon Nov 11, 2011  8:02 PM       Please report. Labs are all normal.

## 2011-11-20 ENCOUNTER — Encounter (HOSPITAL_COMMUNITY): Payer: Self-pay | Admitting: General Surgery

## 2011-11-28 ENCOUNTER — Encounter (INDEPENDENT_AMBULATORY_CARE_PROVIDER_SITE_OTHER): Payer: Medicare Other | Admitting: General Surgery

## 2011-12-03 ENCOUNTER — Encounter: Payer: Self-pay | Admitting: Nurse Practitioner

## 2011-12-03 ENCOUNTER — Ambulatory Visit (INDEPENDENT_AMBULATORY_CARE_PROVIDER_SITE_OTHER): Payer: Medicare Other | Admitting: Nurse Practitioner

## 2011-12-03 ENCOUNTER — Encounter (HOSPITAL_COMMUNITY): Payer: Self-pay | Admitting: Pharmacy Technician

## 2011-12-03 ENCOUNTER — Ambulatory Visit
Admission: RE | Admit: 2011-12-03 | Discharge: 2011-12-03 | Disposition: A | Discharge status: Home / Self Care | Payer: Medicare Other | Source: Ambulatory Visit | Attending: Nurse Practitioner | Admitting: Nurse Practitioner

## 2011-12-03 ENCOUNTER — Other Ambulatory Visit: Payer: Self-pay | Admitting: Nurse Practitioner

## 2011-12-03 VITALS — BP 120/82 | HR 72 | Ht 73.0 in | Wt 218.0 lb

## 2011-12-03 DIAGNOSIS — R5383 Other fatigue: Secondary | ICD-10-CM

## 2011-12-03 DIAGNOSIS — Z01810 Encounter for preprocedural cardiovascular examination: Secondary | ICD-10-CM

## 2011-12-03 DIAGNOSIS — R5381 Other malaise: Secondary | ICD-10-CM | POA: Diagnosis not present

## 2011-12-03 DIAGNOSIS — Z01811 Encounter for preprocedural respiratory examination: Secondary | ICD-10-CM | POA: Diagnosis not present

## 2011-12-03 DIAGNOSIS — R079 Chest pain, unspecified: Secondary | ICD-10-CM | POA: Diagnosis not present

## 2011-12-03 DIAGNOSIS — I10 Essential (primary) hypertension: Secondary | ICD-10-CM

## 2011-12-03 DIAGNOSIS — I4891 Unspecified atrial fibrillation: Secondary | ICD-10-CM

## 2011-12-03 DIAGNOSIS — R9389 Abnormal findings on diagnostic imaging of other specified body structures: Secondary | ICD-10-CM | POA: Diagnosis not present

## 2011-12-03 DIAGNOSIS — I2510 Atherosclerotic heart disease of native coronary artery without angina pectoris: Secondary | ICD-10-CM

## 2011-12-03 LAB — CBC WITH DIFFERENTIAL/PLATELET
Basophils Absolute: 0 10*3/uL (ref 0.0–0.1)
Basophils Relative: 0.4 % (ref 0.0–3.0)
Eosinophils Absolute: 0.1 10*3/uL (ref 0.0–0.7)
Eosinophils Relative: 2.2 % (ref 0.0–5.0)
HCT: 48.2 % (ref 39.0–52.0)
Hemoglobin: 15.7 g/dL (ref 13.0–17.0)
Lymphocytes Relative: 28.8 % (ref 12.0–46.0)
Lymphs Abs: 1.9 10*3/uL (ref 0.7–4.0)
MCHC: 32.7 g/dL (ref 30.0–36.0)
MCV: 99.8 fl (ref 78.0–100.0)
Monocytes Absolute: 0.5 10*3/uL (ref 0.1–1.0)
Monocytes Relative: 8.3 % (ref 3.0–12.0)
Neutro Abs: 3.9 10*3/uL (ref 1.4–7.7)
Neutrophils Relative %: 60.3 % (ref 43.0–77.0)
Platelets: 197 10*3/uL (ref 150.0–400.0)
RBC: 4.83 Mil/uL (ref 4.22–5.81)
RDW: 13.5 % (ref 11.5–14.6)
WBC: 6.5 10*3/uL (ref 4.5–10.5)

## 2011-12-03 LAB — PROTIME-INR
INR: 1.2 ratio — ABNORMAL HIGH (ref 0.8–1.0)
Prothrombin Time: 12.7 s — ABNORMAL HIGH (ref 10.2–12.4)

## 2011-12-03 LAB — APTT: aPTT: 33.5 s — ABNORMAL HIGH (ref 21.7–28.8)

## 2011-12-03 NOTE — Patient Instructions (Signed)
Stay on your current medicines.  We are checking labs today.   Go to [facility] for your CXR today. You may walk in to [facility].   You are scheduled for a cardioversion on Thursday at May 2nd at 8:30am Thursday with Dr. Graciela Husbands or associates. Please go to [facility] 2nd Floor Short Stay at 6:30am.  Do not have any food or drink after midnight on Wednesday.  You may take your medicines with a sip of water on the day of your procedure.  You will need someone to drive you home following your procedure.   Call the [facility] office at [phone] if you have any questions, problems or concerns.

## 2011-12-03 NOTE — Assessment & Plan Note (Signed)
Per Dr. Odessa Fleming plan, will proceed on with cardioversion later this week. The procedure has been discussed in detail and he is willing to proceed. His cardioversion is scheduled for Thursday at 8:30am. Labs and CXR are done today. Patient is agreeable to this plan and will call if any problems develop in the interim.

## 2011-12-03 NOTE — Progress Notes (Signed)
 [name] Date of Birth: 11/10/1945 Medical Record #[id number]  History of Present Illness: [name] is seen back today for a follow up visit. He is seen for Dr. Klein. He has recurrent atrial fib noted at his preop visit for hernia surgery last month. Follow up echo showed normal LV function with satisfactory LA size. Did have a somewhat enlarged RV which will need following. Other issues include CAD per cath in 2006, HTN, HLD and depression. His last nuclear study was in 2011 and was satisfactory.   He comes in today. He is doing ok. Still fatigues rather easily and it is bothersome to him. Some palpitations but no racing. Not dizzy or lightheaded. Has been on his Xarelto for 3 weeks now. Plan per Dr. Klein was to proceed with just cardioversion. He may need antiarrhythmic therapy. He has been on Tambocor in the remote past but it was stopped following his atrial flutter ablation. No chest pain. Trying to exercise at the gym. Not short of breath. Has had no problems with the Xarelto and has recovered nicely from his hernia surgery.   Current Outpatient Prescriptions on File Prior to Visit  Medication Sig Dispense Refill  . DULoxetine (CYMBALTA) 30 MG capsule Take 8 mg by mouth daily with breakfast. Pt open the capsule and puts a little in yogurt      . Rivaroxaban (XARELTO) 20 MG TABS Take 20 mg by mouth daily.  30 tablet  6    Allergies  Allergen Reactions  . Horse-Derived Products Hives    Past Medical History  Diagnosis Date  . Hypertension   . Hyperlipidemia   . CAD (coronary artery disease)     Mild 2 vessel disease per cath in 2006; myoview neg 2011  . Normal nuclear stress test 2011  . Tobacco abuse   . PVC's (premature ventricular contractions)   . Atrial flutter     s/p ablation 2006  . AF (atrial fibrillation)     recent new occurrence 3/13  . Depression 11-05-11    On Cymbalta  . Right ventricular enlargement     Mild noted echo 3/13  . Chronic anticoagulation    on Xarelto - started 11/12/11    Past Surgical History  Procedure Date  . Inguinal hernia repair 01/28/2008    Recurrent right inguinal hernia Laparoscopic, preperitoneal repair of recurrent right inguinal hernia with mesh -- SURGEON:  Dr. Haywood Ingram.  . Vasectomy   . Cardiac catheterization 11/1988, 12/07/2004    showing essentially normal left  ventricular function, moderate two-vessel disease (50% LAD after the third diagonal and a 60+% stenosis in the second obtuse marginal)         . Tee with cardioversion 2001, 2006    Invasive electrophysiology study, electroanatomical mapping,and radiofrequency catheter ablation. -- patient's arrhythmia was isthmus dependent flutter not withstanding the very long cycle length.  RF energy delivered across the cavotricuspid isthmus successfully interrupted conduction across the isthmus and eliminated the patient's sub-straight. PHYSICIAN:  Steven C. Klein, M.D  . Foot surgery 1996    left-retained hardware  . Inguinal hernia repair 11/06/2011    Procedure: HERNIA REPAIR INGUINAL ADULT;  Surgeon: Haywood M Ingram, MD;  Location: [facility];  Service: General;  Laterality: Left;    History  Smoking status  . Former Smoker -- 4 years  . Types: Cigarettes  . Quit date: 08/05/1966  Smokeless tobacco  . Not on file    History  Alcohol Use  . 1.2 oz/week  .   2 Shots of liquor per week    Family History  Problem Relation Age of Onset  . Heart attack Father 83  . Cancer Father     colon  . Kidney disease Father     End Stage  . Atrial fibrillation Mother     flutter  . Heart failure Mother 92  . Cancer Sister     colon  . Cancer Paternal Uncle     lung  . Coronary artery disease Father     Had Valve Replacement    Review of Systems: The review of systems is positive for fatigue that he feels is related to his rhythm.  All other systems were reviewed and are negative.  Physical Exam: BP 120/82  Pulse 72  Ht 6' 1" (1.854 m)  Wt 218 lb  (98.884 kg)  BMI 28.76 kg/m2 Patient is very pleasant and in no acute distress. Skin is warm and dry. Color is normal.  HEENT is unremarkable. Normocephalic/atraumatic. PERRL. Sclera are nonicteric. Neck is supple. No masses. No JVD. Lungs are clear. Cardiac exam shows an irregular rhythm. Rate is controlled. Abdomen is soft. Extremities are without edema. Gait and ROM are intact. No gross neurologic deficits noted.   LABORATORY DATA: EKG today shows atrial fib with PVC's. Tracing was reviewed with Dr. Klein.    Assessment / Plan:  

## 2011-12-03 NOTE — Assessment & Plan Note (Signed)
No exertional symptoms reported. Last nuclear test was in July of 2011, showing no ischemia and a normal EF.

## 2011-12-03 NOTE — Assessment & Plan Note (Signed)
Blood pressure is ok. Currently on no therapy.

## 2011-12-04 LAB — BASIC METABOLIC PANEL
BUN: 15 mg/dL (ref 6–23)
CO2: 30 mEq/L (ref 19–32)
Calcium: 9.2 mg/dL (ref 8.4–10.5)
Chloride: 106 mEq/L (ref 96–112)
Creatinine, Ser: 0.9 mg/dL (ref 0.4–1.5)
GFR: 84.91 mL/min (ref >60.00)
Glucose, Bld: 67 mg/dL — ABNORMAL LOW (ref 70–99)
Potassium: 4.3 mEq/L (ref 3.5–5.1)
Sodium: 140 mEq/L (ref 135–145)

## 2011-12-04 MED ORDER — DEXTROSE-NACL 5-0.45 % IV SOLN
INTRAVENOUS | Status: DC
  Administered 2011-12-05: 500 mL via INTRAVENOUS

## 2011-12-05 ENCOUNTER — Encounter (HOSPITAL_COMMUNITY)
Admission: RE | Disposition: A | Discharge status: Home / Self Care | Payer: Self-pay | Source: Ambulatory Visit | Attending: Internal Medicine

## 2011-12-05 ENCOUNTER — Ambulatory Visit (HOSPITAL_COMMUNITY)
Admission: RE | Admit: 2011-12-05 | Discharge: 2011-12-05 | Disposition: A | Discharge status: Home / Self Care | Payer: Medicare Other | Source: Ambulatory Visit | Attending: Internal Medicine | Admitting: Internal Medicine

## 2011-12-05 DIAGNOSIS — I4891 Unspecified atrial fibrillation: Secondary | ICD-10-CM | POA: Diagnosis not present

## 2011-12-05 DIAGNOSIS — Z01810 Encounter for preprocedural cardiovascular examination: Secondary | ICD-10-CM

## 2011-12-05 HISTORY — PX: CARDIOVERSION: SHX1299

## 2011-12-05 SURGERY — CARDIOVERSION
Anesthesia: LOCAL | Wound class: Clean

## 2011-12-05 MED ORDER — SODIUM CHLORIDE 0.9 % IJ SOLN: 3.0000 mL | Freq: Two times a day (BID) | INTRAMUSCULAR | Status: DC

## 2011-12-05 MED ORDER — FENTANYL CITRATE 0.05 MG/ML IJ SOLN
INTRAMUSCULAR | Status: AC
  Filled 2011-12-05: qty 2

## 2011-12-05 MED ORDER — MIDAZOLAM HCL 2 MG/2ML IJ SOLN
INTRAMUSCULAR | Status: AC
  Filled 2011-12-05: qty 4

## 2011-12-05 MED ORDER — MIDAZOLAM HCL 2 MG/2ML IJ SOLN
INTRAMUSCULAR | Status: AC
  Filled 2011-12-05: qty 2

## 2011-12-05 MED ORDER — SODIUM CHLORIDE 0.9 % IV SOLN: 250.0000 mL | INTRAVENOUS | Status: AC

## 2011-12-05 NOTE — Discharge Instructions (Signed)
General Anesthetic, Adult A doctor specialized in giving anesthesia (anesthesiologist) or a nurse specialized in giving anesthesia (nurse anesthetist) gives medicine that makes you sleep while a procedure is performed (general anesthetic). Once the general anesthetic has been administered, you will be in a sleeplike state in which you feel no pain. After having a general anestheticyou may feel:   Dizzy.   Weak.   Drowsy.   Confused.    These feelings are normal and can be expected to last for up to 24 hours after the procedure is completed.  AFTER THE PROCEDURE  After surgery, you will be taken to the recovery area where a nurse will monitor your progress. You will be allowed to go home when you are awake, stable, taking fluids well, and without serious pain or complications.   For the first 24 hours following an anesthetic:   Have a responsible person with you.   Do not drive a car. If you are alone, do not take public transportation.   Do not engage in strenuous activity. You may usually resume normal activities the next day, or as advised by your caregiver.   Do not drink alcohol.   Do not take medicine that has not been prescribed by your caregiver.   Do not sign important papers or make important decisions as your judgement may be impaired.   You may resume a normal diet as directed.   Change bandages (dressings) as directed.   Only take over-the-counter or prescription medicines for pain, discomfort, or fever as directed by your caregiver.  If you have questions or problems that seem related to the anesthetic, call the hospital and ask for the anesthetist, anesthesiologist, or anesthesia department. SEEK IMMEDIATE MEDICAL CARE IF:   You develop a rash.   You have difficulty breathing.   You have chest pain.   You have allergic problems.   You have uncontrolled nausea.   You have uncontrolled vomiting.   You develop any serious bleeding, especially from the  incision site.  Document Released: 10/29/2007 Document Revised: 07/11/2011 Document Reviewed: 11/22/2010 [facility] Patient Information 2012 [place], [place].  Electrical Cardioversion  Cardioversion is the delivery of a jolt of electricity to change the rhythm of the heart. Sticky patches or metal paddles are placed on the chest to deliver the electricity from a special device. This is done to restore a normal rhythm. A rhythm that is too fast or not regular keeps the heart from pumping well. Compared to medicines used to change an abnormal rhythm, cardioversion is faster and works better. It is also unpleasant and may dislodge blood clots from the heart. WHEN WOULD THIS BE DONE?  In an emergency:   There is low or no blood pressure as a result of the heart rhythm.   Normal rhythm must be restored as fast as possible to protect the brain and heart from further damage.   It may save a life.   For less serious heart rhythms, such as atrial fibrillation or flutter, in which:   The heart is beating too fast or is not regular.   The heart is still able to pump enough blood, but not as well as it should.   Medicine to change the rhythm has not worked.   It is safe to wait in order to allow time for preparation.  LET YOUR CAREGIVER KNOW ABOUT:   Every medicine you are taking. It is very important to do this! Know when to take or stop taking any of them.  Any time in the past that you have felt your heart was not beating normally.  RISKS AND COMPLICATIONS   Clots may form in the chambers of the heart if it is beating too fast. These clots may be dislodged during the procedure and travel to other parts of the body.   There is risk of a stroke during and after the procedure if a clot moves. Blood thinners lower this risk.   You may have a special test of your heart (TEE) to make sure there are no clots in your heart.  BEFORE THE PROCEDURE   You may have some tests to see how well your  heart is working.   You may start taking blood thinners so your blood does not clot as easily.   Other drugs may be given to help your heart work better.  PROCEDURE (SCHEDULED)  The procedure is typically done in a hospital by a heart doctor (cardiologist).   You will be told when and where to go.   You may be given some medicine through an intravenous (IV) access to reduce discomfort and make you sleepy before the procedure.   Your whole body may move when the shock is delivered. Your chest may feel sore.   You may be able to go home after a few hours. Your heart rhythm will be watched to make sure it does not change.  HOME CARE INSTRUCTIONS   Only take medicine as directed by your caregiver. Be sure you understand how and when to take your medicine.   Learn how to feel your pulse and check it often.   Limit your activity for 48 hours.   Avoid caffeine and other stimulants as directed.  SEEK MEDICAL CARE IF:   You feel like your heart is beating too fast or your pulse is not regular.   You have any questions about your medicines.   You have bleeding that will not stop.  SEEK IMMEDIATE MEDICAL CARE IF:   You are dizzy or feel faint.   It is hard to breathe or you feel short of breath.   There is a change in discomfort in your chest.   Your speech is slurred or you have trouble moving your arm or leg on one side.   You get a muscle cramp.   Your fingers or toes turn cold or blue.  MAKE SURE YOU:   Understand these instructions.   Will watch your condition.   Will get help right away if you are not doing well or get worse.  Document Released: 07/12/2002 Document Revised: 07/11/2011 Document Reviewed: 11/11/2007 [facility] Patient Information 2012 [place], [place].

## 2011-12-05 NOTE — CV Procedure (Signed)
.  preop dx AF Post op Dx  NSR with 1AVB  Pt sedated with versed and fentanyl  8/125  Tolerated well

## 2011-12-05 NOTE — Interval H&P Note (Signed)
History and Physical Interval Note:  12/05/2011 7:44 AM  [name]  has presented today for surgery, with the diagnosis of afib  The various methods of treatment have been discussed with the patient and family. After consideration of risks, benefits and other options for treatment, the patient has consented to  Procedure(s) (LRB): CARDIOVERSION (N/A) as a surgical intervention .  The patients' history has been reviewed, patient examined, no change in status, stable for surgery.  I have reviewed the patients' chart and labs.  Questions were answered to the patient's satisfaction.     Sherryl Manges  Remains in AFib  For dccv Tolerating Rivaroxaban

## 2011-12-05 NOTE — H&P (View-Only) (Signed)
Sherian Maroon Date of Birth: Jul 08, 1946 Medical Record #[id number]  History of Present Illness: [name] is seen back today for a follow up visit. He is seen for Dr. Graciela Husbands. He has recurrent atrial fib noted at his preop visit for hernia surgery last month. Follow up echo showed normal LV function with satisfactory LA size. Did have a somewhat enlarged RV which will need following. Other issues include CAD per cath in 2006, HTN, HLD and depression. His last nuclear study was in 2011 and was satisfactory.   He comes in today. He is doing ok. Still fatigues rather easily and it is bothersome to him. Some palpitations but no racing. Not dizzy or lightheaded. Has been on his Xarelto for 3 weeks now. Plan per Dr. Graciela Husbands was to proceed with just cardioversion. He may need antiarrhythmic therapy. He has been on Tambocor in the remote past but it was stopped following his atrial flutter ablation. No chest pain. Trying to exercise at the gym. Not short of breath. Has had no problems with the Xarelto and has recovered nicely from his hernia surgery.   Current Outpatient Prescriptions on File Prior to Visit  Medication Sig Dispense Refill  . DULoxetine (CYMBALTA) 30 MG capsule Take 8 mg by mouth daily with breakfast. Pt open the capsule and puts a little in yogurt      . Rivaroxaban (XARELTO) 20 MG TABS Take 20 mg by mouth daily.  30 tablet  6    Allergies  Allergen Reactions  . Horse-Derived Products Hives    Past Medical History  Diagnosis Date  . Hypertension   . Hyperlipidemia   . CAD (coronary artery disease)     Mild 2 vessel disease per cath in 2006; myoview neg 2011  . Normal nuclear stress test 2011  . Tobacco abuse   . PVC's (premature ventricular contractions)   . Atrial flutter     s/p ablation 2006  . AF (atrial fibrillation)     recent new occurrence 3/13  . Depression 11-05-11    On Cymbalta  . Right ventricular enlargement     Mild noted echo 3/13  . Chronic anticoagulation    on Xarelto - started 11/12/11    Past Surgical History  Procedure Date  . Inguinal hernia repair 01/28/2008    Recurrent right inguinal hernia Laparoscopic, preperitoneal repair of recurrent right inguinal hernia with mesh -- SURGEON:  Dr. Claud Kelp.  . Vasectomy   . Cardiac catheterization 11/1988, 12/07/2004    showing essentially normal left  ventricular function, moderate two-vessel disease (50% LAD after the third diagonal and a 60+% stenosis in the second obtuse marginal)         . Tee with cardioversion 2001, 2006    Invasive electrophysiology study, electroanatomical mapping,and radiofrequency catheter ablation. -- patient's arrhythmia was isthmus dependent flutter not withstanding the very long cycle length.  RF energy delivered across the cavotricuspid isthmus successfully interrupted conduction across the isthmus and eliminated the patient's sub-straight. PHYSICIAN:  Duke Salvia, M.D  . Foot surgery 1996    left-retained hardware  . Inguinal hernia repair 11/06/2011    Procedure: HERNIA REPAIR INGUINAL ADULT;  Surgeon: Ernestene Mention, MD;  Location: [facility];  Service: General;  Laterality: Left;    History  Smoking status  . Former Smoker -- 4 years  . Types: Cigarettes  . Quit date: 08/05/1966  Smokeless tobacco  . Not on file    History  Alcohol Use  . 1.2 oz/week  .  2 Shots of liquor per week    Family History  Problem Relation Age of Onset  . Heart attack Father 14  . Cancer Father     colon  . Kidney disease Father     End Stage  . Atrial fibrillation Mother     flutter  . Heart failure Mother 3  . Cancer Sister     colon  . Cancer Paternal Uncle     lung  . Coronary artery disease Father     Had Valve Replacement    Review of Systems: The review of systems is positive for fatigue that he feels is related to his rhythm.  All other systems were reviewed and are negative.  Physical Exam: BP 120/82  Pulse 72  Ht 6\' 1"  (1.854 m)  Wt 218 lb  (98.884 kg)  BMI 28.76 kg/m2 Patient is very pleasant and in no acute distress. Skin is warm and dry. Color is normal.  HEENT is unremarkable. Normocephalic/atraumatic. PERRL. Sclera are nonicteric. Neck is supple. No masses. No JVD. Lungs are clear. Cardiac exam shows an irregular rhythm. Rate is controlled. Abdomen is soft. Extremities are without edema. Gait and ROM are intact. No gross neurologic deficits noted.   LABORATORY DATA: EKG today shows atrial fib with PVC's. Tracing was reviewed with Dr. Graciela Husbands.    Assessment / Plan:

## 2011-12-18 DIAGNOSIS — L570 Actinic keratosis: Secondary | ICD-10-CM | POA: Diagnosis not present

## 2011-12-18 DIAGNOSIS — D485 Neoplasm of uncertain behavior of skin: Secondary | ICD-10-CM | POA: Diagnosis not present

## 2011-12-18 DIAGNOSIS — D239 Other benign neoplasm of skin, unspecified: Secondary | ICD-10-CM | POA: Diagnosis not present

## 2011-12-18 DIAGNOSIS — L821 Other seborrheic keratosis: Secondary | ICD-10-CM | POA: Diagnosis not present

## 2011-12-18 DIAGNOSIS — B079 Viral wart, unspecified: Secondary | ICD-10-CM | POA: Diagnosis not present

## 2011-12-19 ENCOUNTER — Encounter: Payer: Self-pay | Admitting: Internal Medicine

## 2011-12-19 ENCOUNTER — Encounter (INDEPENDENT_AMBULATORY_CARE_PROVIDER_SITE_OTHER): Payer: Self-pay | Admitting: General Surgery

## 2011-12-19 ENCOUNTER — Ambulatory Visit (INDEPENDENT_AMBULATORY_CARE_PROVIDER_SITE_OTHER): Payer: Medicare Other | Admitting: General Surgery

## 2011-12-19 ENCOUNTER — Ambulatory Visit (INDEPENDENT_AMBULATORY_CARE_PROVIDER_SITE_OTHER): Payer: Medicare Other | Admitting: Internal Medicine

## 2011-12-19 VITALS — BP 126/82 | HR 76 | Ht 73.0 in | Wt 213.0 lb

## 2011-12-19 VITALS — BP 130/78 | HR 68 | Temp 97.2°F | Resp 18 | Ht 73.0 in | Wt 213.8 lb

## 2011-12-19 DIAGNOSIS — I4891 Unspecified atrial fibrillation: Secondary | ICD-10-CM | POA: Diagnosis not present

## 2011-12-19 DIAGNOSIS — K4091 Unilateral inguinal hernia, without obstruction or gangrene, recurrent: Secondary | ICD-10-CM

## 2011-12-19 DIAGNOSIS — I2510 Atherosclerotic heart disease of native coronary artery without angina pectoris: Secondary | ICD-10-CM

## 2011-12-19 MED ORDER — FLECAINIDE ACETATE 100 MG PO TABS: 100.0000 mg | ORAL_TABLET | Freq: Two times a day (BID) | ORAL | Status: DC

## 2011-12-19 NOTE — Patient Instructions (Signed)
The surgical incision in the left groin has healed without any obvious complication. You may continue to pursue normal physical activities without restriction. Return to see Dr. Derrell Lolling if any further problems arise.

## 2011-12-19 NOTE — Assessment & Plan Note (Addendum)
As above  In addition, he probably needs a Myoview given his dyspnea but I want to see what his symptoms do following reversion and maintenance of sinus rhythm. We also discussed potential role of catheter ablation

## 2011-12-19 NOTE — Progress Notes (Signed)
Subjective:     Patient ID: [name], male   DOB: 03/15/1946, 66 y.o.   MRN: [id number]  HPI This gentleman underwent open repair recurrent left inguinal hernia with mesh on November 06, 2011. He has done very well. He has no pain. He has returned to swimming and is having no problems with the wound.  Review of Systems     Objective:   Physical Exam The patient looks well.  Left groin incision well healed. No infection. No hematoma. Repair is solid. Penis scrotum and testes normal.    Assessment:     Recurrent left inguinal hernia, uneventful recovery 6 weeks postop open repair with mesh.    Plan:     Continue normal activities without restriction.  Return to see me p.r.n.   Angelia Mould. Derrell Lolling, M.D., [facility] General and Minimally invasive Surgery Breast and Colorectal Surgery Office:   [phone] Pager:   [phone]

## 2011-12-19 NOTE — Progress Notes (Signed)
  HPI  [name] is a 66 y.o. male Seen in follow up for atrial fibrillation for which she underwent DC cardioversion a couple of weeks ago. He has a CHADS-VASc score of 4;  left atrial dimension was 39  For them he thinks for a few hours. He notes atrial fibrillation with dyspnea on exertion, palpitations, and some fatigue.  Past Medical History  Diagnosis Date  . Hypertension   . Hyperlipidemia   . CAD (coronary artery disease)     Mild 2 vessel disease per cath in 2006; myoview neg 2011  . Normal nuclear stress test 2011  . Tobacco abuse   . PVC's (premature ventricular contractions)   . Atrial flutter     s/p ablation 2006  . AF (atrial fibrillation)     recent new occurrence 3/13  . Depression 11-05-11    On Cymbalta  . Right ventricular enlargement     Mild noted echo 3/13  . Chronic anticoagulation     on Xarelto - started 11/12/11    Past Surgical History  Procedure Date  . Inguinal hernia repair 01/28/2008    Recurrent right inguinal hernia Laparoscopic, preperitoneal repair of recurrent right inguinal hernia with mesh -- SURGEON:  Dr. Claud Kelp.  . Vasectomy   . Cardiac catheterization 11/1988, 12/07/2004    showing essentially normal left  ventricular function, moderate two-vessel disease (50% LAD after the third diagonal and a 60+% stenosis in the second obtuse marginal)         . Tee with cardioversion 2001, 2006    Invasive electrophysiology study, electroanatomical mapping,and radiofrequency catheter ablation. -- patient's arrhythmia was isthmus dependent flutter not withstanding the very long cycle length.  RF energy delivered across the cavotricuspid isthmus successfully interrupted conduction across the isthmus and eliminated the patient's sub-straight. PHYSICIAN:  Duke Salvia, M.D  . Foot surgery 1996    left-retained hardware  . Inguinal hernia repair 11/06/2011    Procedure: HERNIA REPAIR INGUINAL ADULT;  Surgeon: Ernestene Mention, MD;  Location: [facility];  Service: General;  Laterality: Left;    Current Outpatient Prescriptions  Medication Sig Dispense Refill  . DULoxetine (CYMBALTA) 30 MG capsule Take 8 mg by mouth daily with breakfast. Pt open the capsule and puts a little in yogurt      . Rivaroxaban (XARELTO) 20 MG TABS Take 20 mg by mouth daily.        Allergies  Allergen Reactions  . Horse-Derived Products Hives    Review of Systems negative except from HPI and PMH  Physical Exam BP 126/82  Pulse 76  Ht 6\' 1"  (1.854 m)  Wt 213 lb (96.616 kg)  BMI 28.10 kg/m2 Well developed and well nourished in no acute distress HENT normal E scleral and icterus clear Neck Supple JVP flat; carotids brisk and full Clear to ausculation Irregular rate and rhythm, no murmurs gallops or rub Soft with active bowel sounds No clubbing cyanosis none Edema Alert and oriented, grossly normal motor and sensory function Skin Warm and Dry  ECG:  *AF  @78             Intervals  -/14/44  Axis -55      Assessment and  Plan

## 2011-12-19 NOTE — Assessment & Plan Note (Signed)
He has recurrent paroxysmal symptomatic atrial fibrillation. He has by catheterization 2006 modest coronary disease. Hence he is not a candidate for 1C antiarrhythmic therapy. We will rather use dofetilide. His QTC is adequate for this and we'll plan inpatient initiation and cardioversion. We have reviewed proarrhythmic potential

## 2011-12-19 NOTE — Patient Instructions (Addendum)
Your physician recommends that you return for lab work in: Tuesday 12-24-11   APPOINTMENT WITH SALLY PUTT FOR TIKOSYN ADMISSION ON Tuesday 12-31-11

## 2011-12-24 ENCOUNTER — Other Ambulatory Visit (INDEPENDENT_AMBULATORY_CARE_PROVIDER_SITE_OTHER): Payer: Medicare Other

## 2011-12-24 DIAGNOSIS — I4891 Unspecified atrial fibrillation: Secondary | ICD-10-CM

## 2011-12-24 LAB — BASIC METABOLIC PANEL
CO2: 29 mEq/L (ref 19–32)
GFR: 87.03 mL/min (ref >60.00)
Glucose, Bld: 65 mg/dL — ABNORMAL LOW (ref 70–99)
Potassium: 4.4 mEq/L (ref 3.5–5.1)
Sodium: 140 mEq/L (ref 135–145)

## 2011-12-31 ENCOUNTER — Encounter (HOSPITAL_COMMUNITY): Payer: Self-pay

## 2011-12-31 ENCOUNTER — Inpatient Hospital Stay (HOSPITAL_COMMUNITY)
Admission: RE | Admit: 2011-12-31 | Discharge: 2012-01-03 | DRG: 310 | Disposition: A | Discharge status: Home / Self Care | Payer: Medicare Other | Source: Ambulatory Visit | Attending: Internal Medicine | Admitting: Internal Medicine

## 2011-12-31 ENCOUNTER — Encounter (HOSPITAL_COMMUNITY)
Admission: RE | Disposition: A | Discharge status: Home / Self Care | Payer: Self-pay | Source: Ambulatory Visit | Attending: Internal Medicine

## 2011-12-31 ENCOUNTER — Other Ambulatory Visit: Payer: Self-pay | Admitting: *Deleted

## 2011-12-31 ENCOUNTER — Ambulatory Visit (INDEPENDENT_AMBULATORY_CARE_PROVIDER_SITE_OTHER): Payer: Medicare Other | Admitting: Pharmacist

## 2011-12-31 VITALS — BP 136/84 | HR 71 | Ht 73.0 in | Wt 219.0 lb

## 2011-12-31 DIAGNOSIS — I2510 Atherosclerotic heart disease of native coronary artery without angina pectoris: Secondary | ICD-10-CM | POA: Diagnosis present

## 2011-12-31 DIAGNOSIS — Z7901 Long term (current) use of anticoagulants: Secondary | ICD-10-CM

## 2011-12-31 DIAGNOSIS — I10 Essential (primary) hypertension: Secondary | ICD-10-CM | POA: Diagnosis not present

## 2011-12-31 DIAGNOSIS — I440 Atrioventricular block, first degree: Secondary | ICD-10-CM | POA: Diagnosis present

## 2011-12-31 DIAGNOSIS — F329 Major depressive disorder, single episode, unspecified: Secondary | ICD-10-CM | POA: Diagnosis present

## 2011-12-31 DIAGNOSIS — F3289 Other specified depressive episodes: Secondary | ICD-10-CM | POA: Diagnosis present

## 2011-12-31 DIAGNOSIS — F17200 Nicotine dependence, unspecified, uncomplicated: Secondary | ICD-10-CM | POA: Diagnosis present

## 2011-12-31 DIAGNOSIS — E785 Hyperlipidemia, unspecified: Secondary | ICD-10-CM | POA: Diagnosis present

## 2011-12-31 DIAGNOSIS — K219 Gastro-esophageal reflux disease without esophagitis: Secondary | ICD-10-CM | POA: Diagnosis present

## 2011-12-31 DIAGNOSIS — I4891 Unspecified atrial fibrillation: Secondary | ICD-10-CM

## 2011-12-31 HISTORY — DX: Atrioventricular block, first degree: I44.0

## 2011-12-31 HISTORY — DX: Gastro-esophageal reflux disease without esophagitis: K21.9

## 2011-12-31 HISTORY — PX: TEE WITHOUT CARDIOVERSION: SHX5443

## 2011-12-31 LAB — BASIC METABOLIC PANEL
BUN: 15 mg/dL (ref 6–23)
Chloride: 104 mEq/L (ref 96–112)
Glucose, Bld: 88 mg/dL (ref 70–99)
Potassium: 4.3 mEq/L (ref 3.5–5.3)
Sodium: 142 mEq/L (ref 135–145)

## 2011-12-31 SURGERY — ECHOCARDIOGRAM, TRANSESOPHAGEAL
Anesthesia: Moderate Sedation

## 2011-12-31 MED ORDER — BUTAMBEN-TETRACAINE-BENZOCAINE 2-2-14 % EX AERO
INHALATION_SPRAY | CUTANEOUS | Status: DC | PRN
  Administered 2011-12-31: 2 via TOPICAL

## 2011-12-31 MED ORDER — SODIUM CHLORIDE 0.9 % IJ SOLN: 3.0000 mL | INTRAMUSCULAR | Status: DC | PRN

## 2011-12-31 MED ORDER — MIDAZOLAM HCL 10 MG/2ML IJ SOLN
INTRAMUSCULAR | Status: DC | PRN
  Administered 2011-12-31 (×3): 2 mg via INTRAVENOUS

## 2011-12-31 MED ORDER — SODIUM CHLORIDE 0.9 % IJ SOLN
3.0000 mL | Freq: Two times a day (BID) | INTRAMUSCULAR | Status: DC
  Administered 2011-12-31 – 2012-01-03 (×6): 3 mL via INTRAVENOUS

## 2011-12-31 MED ORDER — MIDAZOLAM HCL 10 MG/2ML IJ SOLN
INTRAMUSCULAR | Status: AC
  Filled 2011-12-31: qty 2

## 2011-12-31 MED ORDER — RIVAROXABAN 10 MG PO TABS
20.0000 mg | ORAL_TABLET | Freq: Every day | ORAL | Status: DC
  Administered 2011-12-31 – 2012-01-02 (×3): 20 mg via ORAL
  Filled 2011-12-31 (×4): qty 2

## 2011-12-31 MED ORDER — FENTANYL CITRATE 0.05 MG/ML IJ SOLN
INTRAMUSCULAR | Status: AC
  Filled 2011-12-31: qty 2

## 2011-12-31 MED ORDER — SODIUM CHLORIDE 0.9 % IV SOLN: 250.0000 mL | INTRAVENOUS | Status: DC | PRN

## 2011-12-31 MED ORDER — FENTANYL CITRATE 0.05 MG/ML IJ SOLN
INTRAMUSCULAR | Status: DC | PRN
  Administered 2011-12-31 (×2): 25 ug via INTRAVENOUS

## 2011-12-31 MED ORDER — SODIUM CHLORIDE 0.9 % IJ SOLN
3.0000 mL | Freq: Two times a day (BID) | INTRAMUSCULAR | Status: DC
  Administered 2011-12-31 – 2012-01-02 (×3): 3 mL via INTRAVENOUS

## 2011-12-31 MED ORDER — RIVAROXABAN 20 MG PO TABS: 20.0000 mg | ORAL_TABLET | Freq: Every day | ORAL | Status: DC

## 2011-12-31 MED ORDER — DULOXETINE HCL 30 MG PO CPEP: 8.0000 mg | ORAL_CAPSULE | Freq: Every day | ORAL | Status: DC

## 2011-12-31 MED ORDER — DOFETILIDE 500 MCG PO CAPS
500.0000 ug | ORAL_CAPSULE | Freq: Two times a day (BID) | ORAL | Status: DC
  Administered 2011-12-31 – 2012-01-01 (×3): 500 ug via ORAL
  Filled 2011-12-31 (×5): qty 1

## 2011-12-31 NOTE — Interval H&P Note (Signed)
History and Physical Interval Note:  12/31/2011 1:02 PM  [name]  has presented today for surgery, with the diagnosis of afib-tba after  The various methods of treatment have been discussed with the patient and family. After consideration of risks, benefits and other options for treatment, the patient has consented to  Procedure(s) (LRB): TRANSESOPHAGEAL ECHOCARDIOGRAM (TEE) (N/A) as a surgical intervention .  The patients' history has been reviewed, patient examined, no change in status, stable for surgery.  I have reviewed the patients' chart and labs.  Questions were answered to the patient's satisfaction.     Charlton Haws  TEE ordered by Dr Graciela Husbands prior to [facility] initiation.   1:03 PM 12/31/2011

## 2011-12-31 NOTE — Assessment & Plan Note (Signed)
Reviewed pt's labs.  K- 4.3 (goal>4.0), Mg- 2.0 (goal>2.0).  No INR needed since pt on Xarelto.  Scr- 0.87.  CrCl> 164mL/min   Pt missed 1 dose of Xarelto on Sunday.  Since pt is not in sinus rhythm, Dr. Graciela Husbands prefers to set pt up for TEE today prior to starting Tikosyn.  This has been arranged.  Pt will perform TEE and if no clot seen, okay to start Tikosyn BID.

## 2011-12-31 NOTE — CV Procedure (Signed)
Transesophageal Echocardiogram: Indication:  Afib pre Tikosyn Sedation: Versed: 6, Fentanyl: 50, Other: 0 ASA: 2, Airway: 2  Procedure:  The patient was moderately sedated with the above doses of versed and fentanyl.  Using digital technique an omniplane probe was advanced into the distal esophagus without incident. Transgastric imaging revealed normal LV function with no RWMA;s and no mural apical thrombus..  Estimated ejection fraction was 65%.  Right sided cardiac chambers were normal with no evidence of pulmonary hypertension.  The pulmonary and tricuspid valves were structurally normal.  There was mild  TR The mitral valve was structurally normal with mild mitral regurgitation.    The aortic valve was trileaflet with no AS/AR The aortic root was normal.    Imaging of the septum showed no ASD or VSD Bubble study was negative for shunt 2D and color flow confirmed no PFO  The LAE was well visualized in orthogonal views.  There was no spontaneous contrast and no thrombus.   The LAA was enlarged with no thrombus or spontaneous contrast  The descending thoracic aorta had mild  mural aortic debris with no evidence of aneurysmal dilation or disection  Impression:  1) No LAA thrombus ok to initieate Tikosyn 2) Normal EF 65% 3) Normal LA size 4) Mild MR 5) Mild TR 6) Normal AV 7) No ASD or PFO    Charlton Haws 12/31/2011 1:25 PM

## 2011-12-31 NOTE — Progress Notes (Signed)
HPI  [name] is a 66 y.o. male who is seen for Tikosyn initiation.  He was recently evaluated by Dr. Graciela Husbands.  He has a history of aflutter with an ablation in 2006 but developed atrial fibrillation in 3/13.  He was cardioverted at the beginning of May but reverted back to atrial fibrillation.  At his follow up with Dr. Graciela Husbands, he continued to complain of dyspnea on exertion, palpitations, and fatigue so it was decided to start antiarrhythmic therapy.  He is not a candidate for class IC agents due to a history of CAD.    Reviewed pt's medication list.  He is not on any contraindicated medications.  He is currently anticoagulated with Xarelto.  Pt does report missing a dose on Sunday night.  He is not in normal rhythm today so per Dr. Graciela Husbands, pt will need TEE prior to starting Tikosyn or would need to wait 4 weeks to ensure proper anticoagulation.  Discussed with pt.  He would like to proceed with TEE at this time.  This has been set up at the hospital for today.   Reviewed importance of compliance with pt.  He states he will make sure he is compliant with the medication and call us if he misses more than 2 doses.    EKG- undetermined rhythm with QTc- 447 msec.  Per Dr. Graciela Husbands, okay to proceed with Tikosyn.   Current Outpatient Prescriptions on File Prior to Visit  Medication Sig Dispense Refill  . DULoxetine (CYMBALTA) 30 MG capsule Take 8 mg by mouth daily with breakfast. Pt open the capsule and puts a little in yogurt      . Rivaroxaban (XARELTO) 20 MG TABS Take 20 mg by mouth daily.        Allergies  Allergen Reactions  . Horse-Derived Products Hives

## 2011-12-31 NOTE — H&P (View-Only) (Signed)
  HPI  [name] is a 66 y.o. male Seen in follow up for atrial fibrillation for which she underwent DC cardioversion a couple of weeks ago. He has a CHADS-VASc score of 4;  left atrial dimension was 39  For them he thinks for a few hours. He notes atrial fibrillation with dyspnea on exertion, palpitations, and some fatigue.  Past Medical History  Diagnosis Date  . Hypertension   . Hyperlipidemia   . CAD (coronary artery disease)     Mild 2 vessel disease per cath in 2006; myoview neg 2011  . Normal nuclear stress test 2011  . Tobacco abuse   . PVC's (premature ventricular contractions)   . Atrial flutter     s/p ablation 2006  . AF (atrial fibrillation)     recent new occurrence 3/13  . Depression 11-05-11    On Cymbalta  . Right ventricular enlargement     Mild noted echo 3/13  . Chronic anticoagulation     on Xarelto - started 11/12/11    Past Surgical History  Procedure Date  . Inguinal hernia repair 01/28/2008    Recurrent right inguinal hernia Laparoscopic, preperitoneal repair of recurrent right inguinal hernia with mesh -- SURGEON:  Dr. Haywood Ingram.  . Vasectomy   . Cardiac catheterization 11/1988, 12/07/2004    showing essentially normal left  ventricular function, moderate two-vessel disease (50% LAD after the third diagonal and a 60+% stenosis in the second obtuse marginal)         . Tee with cardioversion 2001, 2006    Invasive electrophysiology study, electroanatomical mapping,and radiofrequency catheter ablation. -- patient's arrhythmia was isthmus dependent flutter not withstanding the very long cycle length.  RF energy delivered across the cavotricuspid isthmus successfully interrupted conduction across the isthmus and eliminated the patient's sub-straight. PHYSICIAN:  Ashanti Ratti C. Sheilyn Boehlke, M.D  . Foot surgery 1996    left-retained hardware  . Inguinal hernia repair 11/06/2011    Procedure: HERNIA REPAIR INGUINAL ADULT;  Surgeon: Haywood M Ingram, MD;  Location: [facility];  Service: General;  Laterality: Left;    Current Outpatient Prescriptions  Medication Sig Dispense Refill  . DULoxetine (CYMBALTA) 30 MG capsule Take 8 mg by mouth daily with breakfast. Pt open the capsule and puts a little in yogurt      . Rivaroxaban (XARELTO) 20 MG TABS Take 20 mg by mouth daily.        Allergies  Allergen Reactions  . Horse-Derived Products Hives    Review of Systems negative except from HPI and PMH  Physical Exam BP 126/82  Pulse 76  Ht 6' 1" (1.854 m)  Wt 213 lb (96.616 kg)  BMI 28.10 kg/m2 Well developed and well nourished in no acute distress HENT normal E scleral and icterus clear Neck Supple JVP flat; carotids brisk and full Clear to ausculation Irregular rate and rhythm, no murmurs gallops or rub Soft with active bowel sounds No clubbing cyanosis none Edema Alert and oriented, grossly normal motor and sensory function Skin Warm and Dry  ECG:  *AF  @78            Intervals  -/14/44  Axis -55      Assessment and  Plan  

## 2011-12-31 NOTE — Progress Notes (Signed)
  Echocardiogram Echocardiogram Transesophageal has been performed.  Jorje Guild [facility] 12/31/2011, 2:01 PM

## 2012-01-01 ENCOUNTER — Ambulatory Visit: Admit: 2012-01-01 | Payer: Medicare Other | Admitting: Internal Medicine

## 2012-01-01 ENCOUNTER — Encounter (HOSPITAL_COMMUNITY): Payer: Self-pay | Admitting: Cardiovascular Disease

## 2012-01-01 DIAGNOSIS — I10 Essential (primary) hypertension: Secondary | ICD-10-CM | POA: Diagnosis not present

## 2012-01-01 DIAGNOSIS — I2510 Atherosclerotic heart disease of native coronary artery without angina pectoris: Secondary | ICD-10-CM

## 2012-01-01 DIAGNOSIS — I4891 Unspecified atrial fibrillation: Principal | ICD-10-CM

## 2012-01-01 LAB — MAGNESIUM: Magnesium: 2.1 mg/dL (ref 1.5–2.5)

## 2012-01-01 LAB — BASIC METABOLIC PANEL
GFR calc Af Amer: >90 mL/min (ref >90)
GFR calc non Af Amer: >90 mL/min (ref >90)
Potassium: 4.1 mEq/L (ref 3.5–5.1)
Sodium: 140 mEq/L (ref 135–145)

## 2012-01-01 SURGERY — CARDIOVERSION
Anesthesia: General

## 2012-01-01 NOTE — Progress Notes (Signed)
   TELEMETRY: Reviewed telemetry pt in atrial fibrillation with controlled rate: Filed Vitals:   12/31/11 1621 12/31/11 2141 01/01/12 0243 01/01/12 0608  BP: 132/86 118/66 144/91 142/88  Pulse: 56 97 60 63  Temp:  97.8 F (36.6 C) 98 F (36.7 C) 97.8 F (36.6 C)  TempSrc:  Oral Oral Oral  Resp:  16 22 20   Height:      Weight:    96.6 kg (212 lb 15.4 oz)  SpO2: 97% 98% 97% 96%    Intake/Output Summary (Last 24 hours) at 01/01/12 0835 Last data filed at 01/01/12 0813  Gross per 24 hour  Intake    960 ml  Output   1175 ml  Net   -215 ml    SUBJECTIVE Feels fine. No complaints. Received first dose of Tikosyn last night.  LABS: Basic Metabolic Panel:  Basename 01/01/12 0625 12/31/11 0947  NA 140 142  K 4.1 4.3  CL 103 104  CO2 27 29  GLUCOSE 92 88  BUN 12 15  CREATININE 0.80 0.87  CALCIUM 9.1 9.2  MG 2.1 2.0  PHOS -- --   Radiology/Studies:  Dg Chest 2 View  12/03/2011  *RADIOLOGY REPORT*  Clinical Data: Preop for cardioversion, chest pain, shortness of breath and fatigue  CHEST - 2 VIEW  Comparison: Chest x-ray of 01/26/2008  Findings: The lungs are clear and slightly hyperaerated. Mediastinal contours are stable.  The heart is borderline enlarged and stable.  No acute bony abnormality is seen.  IMPRESSION: Stable chest x-ray with slight hyperaeration and borderline cardiomegaly.  Original Report Authenticated By: Juline Patch, M.D.   Ecg 12/31/11 @ 21:40 Atrial fibrillation Qtc 440 msec, RBBB,LAFB         12/31/11 @ 23:27 Atrial fibrillation Qtc 489 msec, RBBB,LAFB  PHYSICAL EXAM General: Well developed, well nourished, in no acute distress. Head: Normocephalic, atraumatic, sclera non-icteric, no xanthomas, nares are without discharge. Neck: Negative for carotid bruits. JVD not elevated. Lungs: Clear bilaterally to auscultation without wheezes, rales, or rhonchi. Breathing is unlabored. Heart: RRR S1 S2 without murmurs, rubs, or gallops.  Abdomen: Soft, non-tender,  non-distended with normoactive bowel sounds. No hepatomegaly. No rebound/guarding. No obvious abdominal masses. Msk:  Strength and tone appears normal for age. Extremities: No clubbing, cyanosis or edema.  Distal pedal pulses are 2+ and equal bilaterally. Neuro: Alert and oriented X 3. Moves all extremities spontaneously. Psych:  Responds to questions appropriately with a normal affect.  ASSESSMENT AND PLAN:* 1. Atrial fibrillation recurrent s/p cardioversion. Now admitted for Tikosyn load. Qtc has increased somewhat with first dose. Will need to monitor closely.  2. CAD 3. HTN 4. Anticoagulation - on Xarelto  Principal Problem:  *AF (atrial fibrillation) Active Problems:  CAD (coronary artery disease)  HTN (hypertension)    Signed, Geraldyn Shain [place] MD,FACC 01/01/2012 8:36 AM

## 2012-01-01 NOTE — Progress Notes (Signed)
Pt converted to sinus rhythm with 1st degree block, few pvc's, pac's. Ekg done to confirm, D Dunn made aware.

## 2012-01-02 ENCOUNTER — Encounter (HOSPITAL_COMMUNITY): Payer: Self-pay | Admitting: Internal Medicine

## 2012-01-02 DIAGNOSIS — I4891 Unspecified atrial fibrillation: Secondary | ICD-10-CM | POA: Diagnosis not present

## 2012-01-02 LAB — GLUCOSE, CAPILLARY: Glucose-Capillary: 98 mg/dL (ref 70–99)

## 2012-01-02 MED ORDER — DOFETILIDE 500 MCG PO CAPS
500.0000 ug | ORAL_CAPSULE | Freq: Two times a day (BID) | ORAL | Status: DC
  Administered 2012-01-02 (×2): 500 ug via ORAL
  Filled 2012-01-02 (×5): qty 1

## 2012-01-02 NOTE — Care Management Note (Signed)
    Page 1 of 1   01/03/2012     12:34:10 PM   CARE MANAGEMENT NOTE 01/03/2012  Patient:  [name], [name]   Account Number:  [id number]  Date Initiated:  01/02/2012  Documentation initiated by:  Tera Mater  Subjective/Objective Assessment:   66yo male admitted to have TEE and to be started on Tikosyn.  Pt. lives alone in [place].     Action/Plan:   Spoke with pt. about the [employer].  Pt. states he uses [employer].  TC to [facility] to inquire of pt. co-pay and it will be $17/month for medication.   Anticipated DC Date:  01/03/2012   Anticipated DC Plan:  HOME/SELF CARE      DC Planning Services  CM consult  Medication Assistance      Choice offered to / List presented to:             Status of service:  Completed, signed off Medicare Important Message given?   (If response is "NO", the following Medicare IM given date fields will be blank) Date Medicare IM given:   Date Additional Medicare IM given:    Discharge Disposition:  HOME/SELF CARE  Per UR Regulation:  Reviewed for med. necessity/level of care/duration of stay  If discussed at Long Length of Stay Meetings, dates discussed:    Comments:  01/03/12 1220 Gave pt. 7 days of Tikosyn.  Pt. will  be able to obtain his Tikosyn from his pharmacy at [facility] for $17/month.  Pt. anticipated discharge is today after 7pm dose of Tikosyn. Tera Mater, RN, BSN [place] [phone]   01/02/12 1445 Spoke with pt. regarding Tikosyn.  Physician please write a RX for 7days of Tikosyn to be filled by the [employer] and write refill RX as well.  Pt. anticipated to be discharged home tomorrow 5/31.  NCM to follow. Tera Mater, RN, BSN NCM [phone]

## 2012-01-02 NOTE — Progress Notes (Signed)
  Patient Name: [name]      SUBJECTIVE:some complaints of dizziness; reverted to sinus yesterday  Has history of pvc  Past Medical History  Diagnosis Date  . Hypertension   . Hyperlipidemia   . CAD (coronary artery disease)     Mild 2 vessel disease per cath in 2006; myoview neg 2011  . Normal nuclear stress test 2011  . Tobacco abuse   . PVC's (premature ventricular contractions)   . Atrial flutter     s/p ablation 2006  . AF (atrial fibrillation)     recent new occurrence 3/13  . Depression 11-05-11    On Cymbalta  . Right ventricular enlargement     Mild noted echo 3/13  . Chronic anticoagulation     on Xarelto - started 11/12/11  . Visit for monitoring Tikosyn therapy 12/31/2011  . Shortness of breath   . GERD (gastroesophageal reflux disease)     PHYSICAL EXAM Filed Vitals:   01/01/12 0608 01/01/12 1410 01/01/12 2227 01/02/12 0454  BP: 142/88 113/70 127/83 144/66  Pulse: 63 58 57 56  Temp: 97.8 F (36.6 C) 98.6 F (37 C) 97.9 F (36.6 C) 97.6 F (36.4 C)  TempSrc: Oral Oral Oral Oral  Resp: 20 20 18 18   Height:      Weight: 212 lb 15.4 oz (96.6 kg)   213 lb 6.5 oz (96.8 kg)  SpO2: 96% 96% 97% 100%    Well developed and nourished in no acute distress HENT normal Neck supple with JVP-flat Clear Regular but slow rate and rhythm, no murmurs or gallops Abd-soft with active BS No Clubbing cyanosis edema Skin-warm and dry A & Oriented  Grossly normal sensory and motor function  ECG yesterday at noon following 2nd dose>>[id number] with QRS 144  TELEMETRY: Reviewed telemetry pt in sinus with occ pvc and couplets Reviewed back to admission and prior to drug>> also with PVCs:    Intake/Output Summary (Last 24 hours) at 01/02/12 0833 Last data filed at 01/02/12 0813  Gross per 24 hour  Intake    900 ml  Output   1725 ml  Net   -825 ml    LABS: Basic Metabolic Panel:  Lab 01/01/12 4403 12/31/11 0947  NA 140 142  K 4.1 4.3  CL 103 104  CO2 27 29    GLUCOSE 92 88  BUN 12 15  CREATININE 0.80 0.87  CALCIUM 9.1 9.2  MG 2.1 2.0  PHOS -- --      ASSESSMENT AND PLAN:  Patient Active Hospital Problem List: AF (atrial fibrillation) ()   CAD (coronary artery disease) (02/12/2011)   HTN (hypertension) (02/12/2011)  PVCs  1AVB  Reviewed physiology of 1AVB and QT interval on 12 lead.  Dizziness maybe 2/2 tikosyn and will have to follow rearrange dosing interval and anticipate d/c tomorrow pm       Signed, Sherryl Manges MD  01/02/2012

## 2012-01-03 DIAGNOSIS — I4891 Unspecified atrial fibrillation: Secondary | ICD-10-CM | POA: Diagnosis not present

## 2012-01-03 LAB — BASIC METABOLIC PANEL
BUN: 14 mg/dL (ref 6–23)
BUN: 17 mg/dL (ref 6–23)
CO2: 28 mEq/L (ref 19–32)
Calcium: 9.6 mg/dL (ref 8.4–10.5)
Creatinine, Ser: 0.99 mg/dL (ref 0.50–1.35)
GFR calc Af Amer: >90 mL/min (ref >90)
GFR calc non Af Amer: 89 mL/min — ABNORMAL LOW (ref >90)
GFR calc non Af Amer: 83 mL/min — ABNORMAL LOW (ref >90)
Glucose, Bld: 101 mg/dL — ABNORMAL HIGH (ref 70–99)
Glucose, Bld: 94 mg/dL (ref 70–99)

## 2012-01-03 MED ORDER — DOFETILIDE 500 MCG PO CAPS
500.0000 ug | ORAL_CAPSULE | Freq: Two times a day (BID) | ORAL | Status: DC
  Administered 2012-01-03: 500 ug via ORAL
  Filled 2012-01-03: qty 1

## 2012-01-03 MED ORDER — POTASSIUM CHLORIDE CRYS ER 20 MEQ PO TBCR
40.0000 meq | EXTENDED_RELEASE_TABLET | Freq: Once | ORAL | Status: AC
  Administered 2012-01-03: 40 meq via ORAL
  Filled 2012-01-03: qty 2

## 2012-01-03 MED ORDER — DOFETILIDE 500 MCG PO CAPS
500.0000 ug | ORAL_CAPSULE | Freq: Two times a day (BID) | ORAL | Status: DC
  Administered 2012-01-03: 500 ug via ORAL
  Filled 2012-01-03 (×2): qty 1

## 2012-01-03 MED ORDER — DOFETILIDE 500 MCG PO CAPS: 500.0000 ug | ORAL_CAPSULE | Freq: Two times a day (BID) | ORAL | Status: DC

## 2012-01-03 NOTE — Discharge Summary (Signed)
ELECTROPHYSIOLOGY DISCHARGE SUMMARY    Patient ID: [name],  MRN: [id number], DOB/AGE: 1945/08/13 66 y.o.  Admit date: 12/31/2011 Discharge date: 01/03/2012  Primary Care Physician: Paul Maduro A. Nicholos Johns, MD Primary Cardiologist: [name]  Primary Discharge Diagnosis:  1.  Atrial fibrillation s/p Tikosyn drug loading  Secondary Discharge Diagnoses:  . Hypertension   . Hyperlipidemia    .  CAD (coronary artery disease)      Mild 2 vessel disease per cath in 2006; myoview neg 2011   .  Normal nuclear stress test  2011   .  Tobacco abuse    .  PVC's (premature ventricular contractions)    .  Atrial flutter      s/p ablation 2006   .  AF (atrial fibrillation)      recent new occurrence 3/13>>Tikosyn   .  Depression  11-05-11     On Cymbalta   .  Right ventricular enlargement      Mild noted echo 3/13   .  Chronic anticoagulation      on Xarelto - started 11/12/11   .  Shortness of breath    .  GERD (gastroesophageal reflux disease)    .  1st degree AV block     Procedures This Admission:  Transesophageal echocardiogram 12/31/2011 -  1) No LAA thrombus ok to initiate Tikosyn  2) Normal EF 65%  3) Normal LA size  4) Mild MR  5) Mild TR  6) Normal AV  7) No ASD or PFO  History and Hospital Course:  [name] was admitted on 12/31/2011 for Tikosyn drug loading for antiarrhythmic treatment of AF. He underwent a transesophageal echocardiogram prior to Tikosyn initiation which showed no LAA thrombus. Please see details as outlined above. He was initiated on Tikosyn per protocol with close monitoring of his potassium level, serum Cr and QT interval. On 01/01/2012 he converted to sinus rhythm with occasional PVCs. He remained hemodynamically stable and his QT interval was stable at approximately 450 ms. His potassium was slightly low at 3.8 which was repleted. He was counseled regarding the importance of medications compliance and follow-up. He has been seen, examined and deemed  stable for discharge by [name].  Discharge Vitals: Blood pressure 117/64, pulse 57, temperature 98.1 F (36.7 C), temperature source Oral, resp. rate 12, height 6\' 1"  (1.854 m), weight 211 lb 8 oz (95.936 kg), SpO2 95.00%.   Labs:  Lab 01/03/12 0828  NA 140  K 3.8  CL 103  CO2 28  BUN 14  CREATININE 0.83  CALCIUM 9.6  PROT --  BILITOT --  ALKPHOS --  ALT --  AST --  GLUCOSE 101*   Disposition:  The patient is being discharged in stable condition.  Follow-up:  Follow up with Paul Newcomer, PA on 01/14/2012. (At 9:45 AM for Tikosyn f/u - 12-lead ECG and BMP)    Contact information:   [address] [place] [address] [phone]    Follow up with Paul Manges, MD on 03/19/2012. (At 11:15 AM)    Contact information:   [address], [address] [place] [address] [phone]   Discharge Medications:   TAKE these medications     CYMBALTA 30 MG capsule   Generic drug: DULoxetine   Take 8 mg by mouth daily with breakfast. Pt open the capsule and puts a little in yogurt      dofetilide 500 MCG capsule   Commonly known  as: TIKOSYN   Take 1 capsule (500 mcg total) by mouth every 12 (twelve) hours.      XARELTO 20 MG Tabs   Generic drug: Rivaroxaban   Take 20 mg by mouth daily.       Duration of Discharge Encounter: Greater than 30 minutes including physician time.  Signed, Paul Duff, PA-C 01/03/2012, 5:02 PM

## 2012-01-03 NOTE — Progress Notes (Signed)
  Patient Name: [name]      SUBJECTIVE:some complaints of dizziness;better reverted to sinus wed;;  Less sob  Still aware of PVC  Past Medical History  Diagnosis Date  . Hypertension   . Hyperlipidemia   . CAD (coronary artery disease)     Mild 2 vessel disease per cath in 2006; myoview neg 2011  . Normal nuclear stress test 2011  . Tobacco abuse   . PVC's (premature ventricular contractions)   . Atrial flutter     s/p ablation 2006  . AF (atrial fibrillation)     recent new occurrence 3/13>>Tikosyn  . Depression 11-05-11    On Cymbalta  . Right ventricular enlargement     Mild noted echo 3/13  . Chronic anticoagulation     on Xarelto - started 11/12/11  . Shortness of breath   . GERD (gastroesophageal reflux disease)   . 1st degree AV block     PHYSICAL EXAM Filed Vitals:   01/02/12 0454 01/02/12 1500 01/02/12 2000 01/03/12 0643  BP: 144/66 118/66 126/68 124/83  Pulse: 56 53 65 59  Temp: 97.6 F (36.4 C) 98.9 F (37.2 C) 98.4 F (36.9 C) 98.2 F (36.8 C)  TempSrc: Oral Oral Oral Oral  Resp: 18 19 18 18   Height:      Weight: 213 lb 6.5 oz (96.8 kg)   211 lb 8 oz (95.936 kg)  SpO2: 100% 95% 97% 97%    Well developed and nourished in no acute distress HENT normal Neck supple with JVP-flat Clear Regular but slow rate and rhythm, no murmurs or gallops Abd-soft with active BS No Clubbing cyanosis edema Skin-warm and dry A & Oriented  Grossly normal sensory and motor function     TELEMETRY: Reviewed telemetry pt in sinus with occ pvc and couplets Reviewed back to admission and prior to drug>> also with PVCs:    Intake/Output Summary (Last 24 hours) at 01/03/12 0804 Last data filed at 01/03/12 0645  Gross per 24 hour  Intake    720 ml  Output   2700 ml  Net  -1980 ml    LABS: Basic Metabolic Panel:  Lab 01/01/12 1610 12/31/11 0947  NA 140 142  K 4.1 4.3  CL 103 104  CO2 27 29  GLUCOSE 92 88  BUN 12 15  CREATININE 0.80 0.87  CALCIUM 9.1  9.2  MG 2.1 2.0  PHOS -- --   ECG  QTC 450 msec or so   ASSESSMENT AND PLAN:  Patient Active Hospital Problem List: AF (atrial fibrillation) ()   CAD (coronary artery disease) (02/12/2011)   HTN (hypertension) (02/12/2011)  PVCs  1AVB  Reviewed physiology of 1AVB and QT interval on 12 lead.  Dizziness maybe 2/2 tikosyn and will have to follow rearrange dosing interval and anticipate d/c tomorrow pm    Home today F/u 10-14 days with PA ECG and BMET F/u sk 10-12 wks    Signed, Sherryl Manges MD  01/03/2012

## 2012-01-03 NOTE — Discharge Instructions (Addendum)
DO NOT start any new medications (including over-the-counter) until you speak to Dr. Graciela Husbands.

## 2012-01-03 NOTE — Progress Notes (Signed)
Pt received last dose of tikysin. Labs drawn and resulted, Shanda Bumps PA seen results and is ok to discharge pt. Discharge instructions given,pt verbalized understanding. Pt left via wheelchair with family. No s/s of distress no complaints.

## 2012-01-08 DIAGNOSIS — N453 Epididymo-orchitis: Secondary | ICD-10-CM | POA: Diagnosis not present

## 2012-01-08 NOTE — Discharge Summary (Signed)
afib for tikosyn initiation Tolerated

## 2012-01-14 ENCOUNTER — Other Ambulatory Visit: Payer: Medicare Other

## 2012-01-14 ENCOUNTER — Ambulatory Visit (INDEPENDENT_AMBULATORY_CARE_PROVIDER_SITE_OTHER): Payer: Medicare Other | Admitting: Physician Assistant

## 2012-01-14 ENCOUNTER — Other Ambulatory Visit (INDEPENDENT_AMBULATORY_CARE_PROVIDER_SITE_OTHER): Payer: Medicare Other

## 2012-01-14 ENCOUNTER — Encounter: Payer: Self-pay | Admitting: Physician Assistant

## 2012-01-14 VITALS — BP 112/58 | HR 57 | Ht 73.0 in | Wt 216.0 lb

## 2012-01-14 DIAGNOSIS — I4891 Unspecified atrial fibrillation: Secondary | ICD-10-CM | POA: Diagnosis not present

## 2012-01-14 DIAGNOSIS — R0989 Other specified symptoms and signs involving the circulatory and respiratory systems: Secondary | ICD-10-CM

## 2012-01-14 LAB — BASIC METABOLIC PANEL
BUN: 14 mg/dL (ref 6–23)
CO2: 30 mEq/L (ref 19–32)
Calcium: 9 mg/dL (ref 8.4–10.5)
Creatinine, Ser: 0.9 mg/dL (ref 0.4–1.5)

## 2012-01-14 LAB — MAGNESIUM: Magnesium: 2.1 mg/dL (ref 1.5–2.5)

## 2012-01-14 NOTE — Patient Instructions (Signed)
Your physician recommends that you schedule a follow-up appointment in: WITH DR. Graciela Husbands AS ALREADY SCHEDULED IN AUGUST  TODAY LABS BMET, MAGNESIUM LEVEL

## 2012-01-14 NOTE — Progress Notes (Signed)
[address] [place], [place]  [address] Phone: [phone] Fax:  [phone]  Date:  01/14/2012   Name:  [name]   DOB:  18-Jan-1946   MRN:  [id number]  PCP:  Lolita Patella, MD, MD  Primary Cardiologist/Primary Electrophysiologist:  Dr. Sherryl Manges    History of Present Illness: [name] is a 66 y.o. male who returns for post hospital follow up.  He has a history of persistent atrial fibrillation, Nonobstructive CAD, hypertension, hyperlipidemia, atrial flutter, status post ablation in 2006.  He recently failed cardioversion was set up for Tikosyn load.  He was admitted 5/28-5/31.  TEE performed 12/23/11 demonstrated no elevate a clot, EF 65%.  The patient converted to sinus rhythm after initiation of Tikosyn.  QT interval remained stable.  Doing well today.  Just got back from swimming [facility] the [facility].  Feels less DOE.  Has chronic CP and epigastric pain.  Has had this for years without changes.  No syncope.  No near syncope.  No orthopnea, PND.  Has mild edema.    Wt Readings from Last 3 Encounters:  01/14/12 216 lb (97.977 kg)  01/03/12 211 lb 8 oz (95.936 kg)  12/31/11 219 lb (99.338 kg)    Potassium  Date/Time Value Range Status  01/03/2012  7:11 PM 4.7  3.5-5.1 (mEq/L) Final     Creatinine, Ser  Date/Time Value Range Status  01/03/2012  7:11 PM 0.99  0.50-1.35 (mg/dL) Final   Magnesium  Date/Time Value Range Status  01/01/2012  6:25 AM 2.1  1.5-2.5 (mg/dL) Final    Past Medical History  Diagnosis Date  . Hypertension   . Hyperlipidemia   . CAD (coronary artery disease)     Mild 2 vessel disease per cath in 2006; myoview neg 2011  . Normal nuclear stress test 2011  . Tobacco abuse   . PVC's (premature ventricular contractions)   . Atrial flutter     s/p ablation 2006  . AF (atrial fibrillation)     recent new occurrence 3/13>>Tikosyn  . Depression 11-05-11    On Cymbalta  . Right ventricular enlargement     Mild noted echo  3/13  . Chronic anticoagulation     on Xarelto - started 11/12/11  . Shortness of breath   . GERD (gastroesophageal reflux disease)   . 1st degree AV block     Current Outpatient Prescriptions  Medication Sig Dispense Refill  . dofetilide (TIKOSYN) 500 MCG capsule Take 1 capsule (500 mcg total) by mouth every 12 (twelve) hours.      . DULoxetine (CYMBALTA) 30 MG capsule Take 8 mg by mouth daily with breakfast. Pt open the capsule and puts a little in yogurt      . Rivaroxaban (XARELTO) 20 MG TABS Take 20 mg by mouth daily.        Allergies: Allergies  Allergen Reactions  . Horse-Derived Products Hives    History  Substance Use Topics  . Smoking status: Former Smoker -- 4 years    Types: Cigarettes    Quit date: 08/05/1966  . Smokeless tobacco: Never Used  . Alcohol Use: 1.2 oz/week    2 Shots of liquor per week     DAILY     PHYSICAL EXAM: VS:  BP 112/58  Pulse 57  Ht 6\' 1"  (1.854 m)  Wt 216 lb (97.977 kg)  BMI 28.50 kg/m2 Well nourished, well developed, in no acute distress HEENT: normal Neck: no JVD Cardiac:  normal S1, S2; RRR; no murmur Lungs:  clear to auscultation bilaterally, no wheezing, rhonchi or rales Abd: soft, nontender, no hepatomegaly Ext: trace bilat LE edema Skin: warm and dry Neuro:  CNs 2-12 intact, no focal abnormalities noted  EKG:  Sinus bradycardia, long first degree AV block, PR interval 378 ms, QTc 486 ms, PVCs, right bundle branch block, no significant change from prior tracing.   ASSESSMENT AND PLAN:  1.  Persistent Atrial Fibrillation He is maintaining normal sinus rhythm on Tikosyn. QTc interval is acceptable. Basic metabolic panel and magnesium to be drawn today. Followup with Dr. Graciela Husbands as scheduled in 03/2012.   Paul Glasgow, PA-C  12:08 PM 01/14/2012

## 2012-01-15 ENCOUNTER — Telehealth: Payer: Self-pay | Admitting: *Deleted

## 2012-01-15 NOTE — Telephone Encounter (Signed)
Message copied by Tarri Fuller on Wed Jan 15, 2012 11:43 AM ------      Message from: [place], [place] T      Created: Tue Jan 14, 2012  5:03 PM       Please notify patient that the lab results are ok.      Tereso Newcomer, PA-C  5:03 PM 01/14/2012

## 2012-01-15 NOTE — Telephone Encounter (Signed)
lmom labs normal 

## 2012-02-04 ENCOUNTER — Other Ambulatory Visit: Payer: Self-pay | Admitting: Cardiology

## 2012-02-04 MED ORDER — RIVAROXABAN 20 MG PO TABS: 20.0000 mg | ORAL_TABLET | Freq: Every day | ORAL | Status: DC

## 2012-02-04 MED ORDER — DOFETILIDE 500 MCG PO CAPS: 500.0000 ug | ORAL_CAPSULE | Freq: Two times a day (BID) | ORAL | Status: DC

## 2012-02-26 DIAGNOSIS — D485 Neoplasm of uncertain behavior of skin: Secondary | ICD-10-CM | POA: Diagnosis not present

## 2012-02-26 DIAGNOSIS — D239 Other benign neoplasm of skin, unspecified: Secondary | ICD-10-CM | POA: Diagnosis not present

## 2012-02-26 DIAGNOSIS — L821 Other seborrheic keratosis: Secondary | ICD-10-CM | POA: Diagnosis not present

## 2012-02-26 DIAGNOSIS — Z85828 Personal history of other malignant neoplasm of skin: Secondary | ICD-10-CM | POA: Diagnosis not present

## 2012-02-26 DIAGNOSIS — D0420 Carcinoma in situ of skin of unspecified ear and external auricular canal: Secondary | ICD-10-CM | POA: Diagnosis not present

## 2012-02-26 DIAGNOSIS — L570 Actinic keratosis: Secondary | ICD-10-CM | POA: Diagnosis not present

## 2012-03-19 ENCOUNTER — Encounter: Payer: Self-pay | Admitting: Internal Medicine

## 2012-03-19 ENCOUNTER — Ambulatory Visit (INDEPENDENT_AMBULATORY_CARE_PROVIDER_SITE_OTHER): Payer: Medicare Other | Admitting: Internal Medicine

## 2012-03-19 VITALS — BP 118/74 | HR 55 | Ht 73.0 in | Wt 221.0 lb

## 2012-03-19 DIAGNOSIS — I2510 Atherosclerotic heart disease of native coronary artery without angina pectoris: Secondary | ICD-10-CM

## 2012-03-19 DIAGNOSIS — I10 Essential (primary) hypertension: Secondary | ICD-10-CM

## 2012-03-19 DIAGNOSIS — I4891 Unspecified atrial fibrillation: Secondary | ICD-10-CM

## 2012-03-19 NOTE — Assessment & Plan Note (Signed)
He is tolerating his status and. We will plan to see him in 6 weeks. His conduction system issues are stable. He is having some insomnia and sleep related to the drug is overall feeling significantly better than he would like to continue on his current medications.

## 2012-03-19 NOTE — Assessment & Plan Note (Signed)
Stable

## 2012-03-19 NOTE — Progress Notes (Signed)
HPI  [name] is a 66 y.o. male Seen in followup for atrial fibrillation for which the takes tikosyn initated may 13  He is doing better in sinus rhythm. His son has noted improved energy as has he although he admits to depression which tends to have him minimize his improvements. He is also noted that his mood is better he is sleeping and being more engaged with his day-to-day life.  He does some problems with sleep and on reviewing this, about 1% of people with dofetilide can have sleep problems.  Thromboembolic risk profile is notable for a CHADS-VASc score of 3. He is on Rivaroxaban   Past Medical History  Diagnosis Date  . Hypertension   . Hyperlipidemia   . CAD (coronary artery disease)     Mild 2 vessel disease per cath in 2006; myoview neg 2011  . Normal nuclear stress test 2011  . Tobacco abuse   . PVC's (premature ventricular contractions)   . Atrial flutter     s/p ablation 2006  . AF (atrial fibrillation)     recent new occurrence 3/13>>Tikosyn  . Depression 11-05-11    On Cymbalta  . Right ventricular enlargement     Mild noted echo 3/13  . Chronic anticoagulation     on Xarelto - started 11/12/11  . Shortness of breath   . GERD (gastroesophageal reflux disease)   . 1st degree AV block     Past Surgical History  Procedure Date  . Inguinal hernia repair 01/28/2008    Recurrent right inguinal hernia Laparoscopic, preperitoneal repair of recurrent right inguinal hernia with mesh -- SURGEON:  Dr. Claud Kelp.  . Vasectomy   . Cardiac catheterization 11/1988, 12/07/2004    showing essentially normal left  ventricular function, moderate two-vessel disease (50% LAD after the third diagonal and a 60+% stenosis in the second obtuse marginal)         . Tee with cardioversion 2001, 2006    Invasive electrophysiology study, electroanatomical mapping,and radiofrequency catheter ablation. -- patient's arrhythmia was isthmus dependent flutter not withstanding the very  long cycle length.  RF energy delivered across the cavotricuspid isthmus successfully interrupted conduction across the isthmus and eliminated the patient's sub-straight. PHYSICIAN:  Duke Salvia, M.D  . Foot surgery 1996    left-retained hardware  . Inguinal hernia repair 11/06/2011    Procedure: HERNIA REPAIR INGUINAL ADULT;  Surgeon: Ernestene Mention, MD;  Location: [facility];  Service: General;  Laterality: Left;  . Hernia repair   . Tee without cardioversion 12/31/2011    Procedure: TRANSESOPHAGEAL ECHOCARDIOGRAM (TEE);  Surgeon: Wendall Stade, MD;  Location: [facility] ENDOSCOPY;  Service: Cardiovascular;  Laterality: N/A;  tba after for tikosyn    Current Outpatient Prescriptions  Medication Sig Dispense Refill  . dofetilide (TIKOSYN) 500 MCG capsule Take 1 capsule (500 mcg total) by mouth every 12 (twelve) hours.  180 capsule  6  . Rivaroxaban (XARELTO) 20 MG TABS Take 1 tablet (20 mg total) by mouth daily.  90 tablet  3    Allergies  Allergen Reactions  . Horse-Derived Products Hives    Review of Systems negative except from HPI and PMH  Physical Exam BP 118/74  Pulse 55  Ht 6\' 1"  (1.854 m)  Wt 221 lb (100.245 kg)  BMI 29.16 kg/m2  SpO2 95% Well developed and well nourished in no acute distress HENT normal E scleral and icterus clear Neck Supple JVP flat; carotids brisk and full Clear to ausculation  Regular rate and rhythm, no murmurs gallops or rub Soft with active bowel sounds No clubbing cyanosis none Edema Alert and oriented, grossly normal motor and sensory function Skin Warm and Dry  Electrocardiogram demonstrates sinus rhythm at 55 Intervals 0.38/0.14/25 one with a QTC of 49 there is an occasional PVeyes onC  Assessment and  Plan

## 2012-03-19 NOTE — Patient Instructions (Signed)
Your physician wants you to follow-up in: 6 months with Dr. Klein. You will receive a reminder letter in the mail two months in advance. If you don't receive a letter, please call our office to schedule the follow-up appointment.  Your physician recommends that you continue on your current medications as directed. Please refer to the Current Medication list given to you today.  

## 2012-03-19 NOTE — Assessment & Plan Note (Signed)
stable °

## 2012-03-26 DIAGNOSIS — C4499 Other specified malignant neoplasm of skin, unspecified: Secondary | ICD-10-CM | POA: Diagnosis not present

## 2012-03-26 DIAGNOSIS — C4492 Squamous cell carcinoma of skin, unspecified: Secondary | ICD-10-CM | POA: Diagnosis not present

## 2012-05-13 DIAGNOSIS — F3289 Other specified depressive episodes: Secondary | ICD-10-CM | POA: Diagnosis not present

## 2012-05-13 DIAGNOSIS — F329 Major depressive disorder, single episode, unspecified: Secondary | ICD-10-CM | POA: Diagnosis not present

## 2012-05-26 DIAGNOSIS — H43399 Other vitreous opacities, unspecified eye: Secondary | ICD-10-CM | POA: Diagnosis not present

## 2012-05-26 DIAGNOSIS — H2510 Age-related nuclear cataract, unspecified eye: Secondary | ICD-10-CM | POA: Diagnosis not present

## 2012-05-26 DIAGNOSIS — H40019 Open angle with borderline findings, low risk, unspecified eye: Secondary | ICD-10-CM | POA: Diagnosis not present

## 2012-06-03 DIAGNOSIS — F3289 Other specified depressive episodes: Secondary | ICD-10-CM | POA: Diagnosis not present

## 2012-06-03 DIAGNOSIS — F329 Major depressive disorder, single episode, unspecified: Secondary | ICD-10-CM | POA: Diagnosis not present

## 2012-07-13 DIAGNOSIS — H40019 Open angle with borderline findings, low risk, unspecified eye: Secondary | ICD-10-CM | POA: Diagnosis not present

## 2012-07-13 DIAGNOSIS — H43399 Other vitreous opacities, unspecified eye: Secondary | ICD-10-CM | POA: Diagnosis not present

## 2012-07-13 DIAGNOSIS — H2510 Age-related nuclear cataract, unspecified eye: Secondary | ICD-10-CM | POA: Diagnosis not present

## 2012-09-03 DIAGNOSIS — L570 Actinic keratosis: Secondary | ICD-10-CM | POA: Diagnosis not present

## 2012-09-03 DIAGNOSIS — L259 Unspecified contact dermatitis, unspecified cause: Secondary | ICD-10-CM | POA: Diagnosis not present

## 2012-09-03 DIAGNOSIS — I781 Nevus, non-neoplastic: Secondary | ICD-10-CM | POA: Diagnosis not present

## 2012-09-03 DIAGNOSIS — D239 Other benign neoplasm of skin, unspecified: Secondary | ICD-10-CM | POA: Diagnosis not present

## 2012-09-03 DIAGNOSIS — D485 Neoplasm of uncertain behavior of skin: Secondary | ICD-10-CM | POA: Diagnosis not present

## 2012-09-03 DIAGNOSIS — Z85828 Personal history of other malignant neoplasm of skin: Secondary | ICD-10-CM | POA: Diagnosis not present

## 2012-09-08 ENCOUNTER — Encounter: Payer: Self-pay | Admitting: Internal Medicine

## 2012-09-08 DIAGNOSIS — R252 Cramp and spasm: Secondary | ICD-10-CM | POA: Diagnosis not present

## 2012-09-08 DIAGNOSIS — F3289 Other specified depressive episodes: Secondary | ICD-10-CM | POA: Diagnosis not present

## 2012-09-08 DIAGNOSIS — Z125 Encounter for screening for malignant neoplasm of prostate: Secondary | ICD-10-CM | POA: Diagnosis not present

## 2012-09-08 DIAGNOSIS — R5381 Other malaise: Secondary | ICD-10-CM | POA: Diagnosis not present

## 2012-09-08 DIAGNOSIS — F329 Major depressive disorder, single episode, unspecified: Secondary | ICD-10-CM | POA: Diagnosis not present

## 2012-09-08 DIAGNOSIS — Z0000 Encounter for general adult medical examination without abnormal findings: Secondary | ICD-10-CM | POA: Diagnosis not present

## 2012-09-08 DIAGNOSIS — R5383 Other fatigue: Secondary | ICD-10-CM | POA: Diagnosis not present

## 2012-10-13 DIAGNOSIS — F3289 Other specified depressive episodes: Secondary | ICD-10-CM | POA: Diagnosis not present

## 2012-10-13 DIAGNOSIS — F329 Major depressive disorder, single episode, unspecified: Secondary | ICD-10-CM | POA: Diagnosis not present

## 2012-10-16 ENCOUNTER — Ambulatory Visit (INDEPENDENT_AMBULATORY_CARE_PROVIDER_SITE_OTHER): Payer: Medicare Other | Admitting: Internal Medicine

## 2012-10-16 ENCOUNTER — Encounter: Payer: Self-pay | Admitting: Internal Medicine

## 2012-10-16 VITALS — BP 121/80 | HR 55 | Ht 73.0 in | Wt 223.2 lb

## 2012-10-16 DIAGNOSIS — I10 Essential (primary) hypertension: Secondary | ICD-10-CM

## 2012-10-16 DIAGNOSIS — R0989 Other specified symptoms and signs involving the circulatory and respiratory systems: Secondary | ICD-10-CM | POA: Diagnosis not present

## 2012-10-16 DIAGNOSIS — R079 Chest pain, unspecified: Secondary | ICD-10-CM | POA: Diagnosis not present

## 2012-10-16 DIAGNOSIS — I4891 Unspecified atrial fibrillation: Secondary | ICD-10-CM | POA: Diagnosis not present

## 2012-10-16 DIAGNOSIS — R0609 Other forms of dyspnea: Secondary | ICD-10-CM | POA: Insufficient documentation

## 2012-10-16 DIAGNOSIS — I2510 Atherosclerotic heart disease of native coronary artery without angina pectoris: Secondary | ICD-10-CM

## 2012-10-16 NOTE — Patient Instructions (Addendum)
Your physician has requested that you have en exercise stress myoview. For further information please visit [email or web address] Please follow instruction sheet, as given.   Your physician wants you to follow-up in: 6 MONTHS WITH DR Logan Bores will receive a reminder letter in the mail two months in advance. If you don't receive a letter, please call our office to schedule the follow-up appointment.

## 2012-10-16 NOTE — Progress Notes (Signed)
Patient Care Team: [name], MD as PCP - General (Family Medicine)   HPI  [name] is a 67 y.o. male Seen in followup for atrial fibrillation for which the takes tikosyn initated may 13  He is doing better in sinus rhythm. His son has noted improved energy as has he although he admits to depression which tends to have him minimize his improvements. He is also noted that his mood is better he is sleeping and being more engaged with his day-to-day life.  He does some problems with sleep and on reviewing this, about 1% of people with dofetilide can have sleep problems.  Thromboembolic risk profile is notable for a CHADS-VASc score of 3. He is on Rivaroxaban  He comes in today with complaints of progressive exercise intolerance. He notes shortness of breath and some chest discomfort relieved with rest. He describes as fairly as midsternal like reflux or pressure. He's also had a little bit of peripheral edema.  Cardiac evaluation included a catheterization 2006 with mild 2 vessel disease and a negative Myoview 2011. Echocardiogram 2013 demonstrated normal left ventricular function   Past Medical History  Diagnosis Date  . Hypertension   . Hyperlipidemia   . CAD (coronary artery disease)     Mild 2 vessel disease per cath in 2006; myoview neg 2011  . Normal nuclear stress test 2011  . Tobacco abuse   . PVC's (premature ventricular contractions)   . Atrial flutter     s/p ablation 2006  . AF (atrial fibrillation)     recent new occurrence 3/13>>Tikosyn  . Depression 11-05-11    On Cymbalta  . Right ventricular enlargement     Mild noted echo 3/13  . Chronic anticoagulation     on Xarelto - started 11/12/11  . Shortness of breath   . GERD (gastroesophageal reflux disease)   . 1St degree AV block     Past Surgical History  Procedure Laterality Date  . Inguinal hernia repair  01/28/2008    Recurrent right inguinal hernia Laparoscopic, preperitoneal repair of recurrent right  inguinal hernia with mesh -- SURGEON:  Dr. Claud Kelp.  . Vasectomy    . Cardiac catheterization  11/1988, 12/07/2004    showing essentially normal left  ventricular function, moderate two-vessel disease (50% LAD after the third diagonal and a 60+% stenosis in the second obtuse marginal)         . Tee with cardioversion  2001, 2006    Invasive electrophysiology study, electroanatomical mapping,and radiofrequency catheter ablation. -- patient's arrhythmia was isthmus dependent flutter not withstanding the very long cycle length.  RF energy delivered across the cavotricuspid isthmus successfully interrupted conduction across the isthmus and eliminated the patient's sub-straight. PHYSICIAN:  Duke Salvia, M.D  . Foot surgery  1996    left-retained hardware  . Inguinal hernia repair  11/06/2011    Procedure: HERNIA REPAIR INGUINAL ADULT;  Surgeon: Ernestene Mention, MD;  Location: [facility];  Service: General;  Laterality: Left;  . Hernia repair    . Tee without cardioversion  12/31/2011    Procedure: TRANSESOPHAGEAL ECHOCARDIOGRAM (TEE);  Surgeon: Wendall Stade, MD;  Location: [facility] ENDOSCOPY;  Service: Cardiovascular;  Laterality: N/A;  tba after for tikosyn    Current Outpatient Prescriptions  Medication Sig Dispense Refill  . buPROPion (WELLBUTRIN XL) 300 MG 24 hr tablet Take 300 mg by mouth daily.      [name] dofetilide (TIKOSYN) 500 MCG capsule Take 1 capsule (500 mcg total) by  mouth every 12 (twelve) hours.  180 capsule  6  . escitalopram (LEXAPRO) 20 MG tablet Take 20 mg by mouth daily.      . Rivaroxaban (XARELTO) 20 MG TABS Take 1 tablet (20 mg total) by mouth daily.  90 tablet  3   No current facility-administered medications for this visit.    Allergies  Allergen Reactions  . Horse-Derived Products Hives    Review of Systems negative except from HPI and PMH  Physical Exam BP 121/80  Pulse 55  Ht 6\' 1"  (1.854 m)  Wt 223 lb 3.2 oz (101.243 kg)  BMI 29.45 kg/m2 Well developed and  well nourished in no acute distress HENT normal E scleral and icterus clear Neck Supple JVP flat; carotids brisk and full Clear to ausculation  Regular rate and rhythm, no murmurs gallops or rub Soft with active bowel sounds No clubbing cyanosis none Edema Alert and oriented, grossly normal motor and sensory function Skin Warm and Dry  Electrocardiogram today demonstrates sinus rhythm at 55 intervals 36/14/51 baseline axis is leftward at -62 with right bundle branch block His intervals are not significantly different from a year ago.  Assessment and  Plan

## 2012-10-16 NOTE — Assessment & Plan Note (Signed)
The patient hasprogressive dyspnea on exertion associated with some chest discomfort. Coronary artery disease based on the fact that he had mild disease 6 years ago certainly is a concern in this is true not withstanding his negative Myoview in 2011 I have reviewed this with him.  The other concern is chronotropic incompetence. He notes that on the machines at the gym that his heart rates are 80-100 despite being quite dyspneic. Stress testing will help [place] with this. We'll undertake a stress Myoview scan as he is ST segment changes precluding the use of standard stress testing.  I would have her relatively low threshold for pursuing catheterization following.

## 2012-10-16 NOTE — Assessment & Plan Note (Signed)
With mild disease, we will check his lipids at his next visit

## 2012-10-16 NOTE — Assessment & Plan Note (Signed)
As above.

## 2012-10-16 NOTE — Assessment & Plan Note (Addendum)
No significant atrial fibrillation which is aware. He is tolerating Tikosyn. QT interval today is within range. He says that   blood work checked by his PCP recently was normal  On Rivaroxaban

## 2012-10-16 NOTE — Assessment & Plan Note (Signed)
Well controlled 

## 2012-10-19 ENCOUNTER — Telehealth: Payer: Self-pay | Admitting: *Deleted

## 2012-10-19 ENCOUNTER — Other Ambulatory Visit: Payer: Self-pay | Admitting: *Deleted

## 2012-10-19 DIAGNOSIS — I2510 Atherosclerotic heart disease of native coronary artery without angina pectoris: Secondary | ICD-10-CM

## 2012-10-19 DIAGNOSIS — I4891 Unspecified atrial fibrillation: Secondary | ICD-10-CM

## 2012-10-19 NOTE — Telephone Encounter (Signed)
Spoke with pt, aware labs from [place] reviewed and they did not check his magnesium or lipids. Labs will be drawn with stress test.

## 2012-10-26 ENCOUNTER — Other Ambulatory Visit (INDEPENDENT_AMBULATORY_CARE_PROVIDER_SITE_OTHER): Payer: Medicare Other

## 2012-10-26 ENCOUNTER — Ambulatory Visit (HOSPITAL_COMMUNITY): Payer: Medicare Other | Attending: Cardiovascular Disease | Admitting: Radiology

## 2012-10-26 VITALS — BP 148/94 | HR 49 | Ht 73.0 in | Wt 223.0 lb

## 2012-10-26 DIAGNOSIS — I2510 Atherosclerotic heart disease of native coronary artery without angina pectoris: Secondary | ICD-10-CM | POA: Diagnosis not present

## 2012-10-26 DIAGNOSIS — R0602 Shortness of breath: Secondary | ICD-10-CM | POA: Diagnosis not present

## 2012-10-26 DIAGNOSIS — Z87891 Personal history of nicotine dependence: Secondary | ICD-10-CM | POA: Diagnosis not present

## 2012-10-26 DIAGNOSIS — R5381 Other malaise: Secondary | ICD-10-CM | POA: Diagnosis not present

## 2012-10-26 DIAGNOSIS — R079 Chest pain, unspecified: Secondary | ICD-10-CM | POA: Diagnosis not present

## 2012-10-26 DIAGNOSIS — R002 Palpitations: Secondary | ICD-10-CM | POA: Diagnosis not present

## 2012-10-26 DIAGNOSIS — I4891 Unspecified atrial fibrillation: Secondary | ICD-10-CM

## 2012-10-26 DIAGNOSIS — R0609 Other forms of dyspnea: Secondary | ICD-10-CM | POA: Diagnosis not present

## 2012-10-26 DIAGNOSIS — R110 Nausea: Secondary | ICD-10-CM | POA: Diagnosis not present

## 2012-10-26 DIAGNOSIS — R0989 Other specified symptoms and signs involving the circulatory and respiratory systems: Secondary | ICD-10-CM | POA: Insufficient documentation

## 2012-10-26 LAB — LIPID PANEL
HDL: 65.5 mg/dL (ref >39.00)
Total CHOL/HDL Ratio: 3
VLDL: 13.2 mg/dL (ref 0.0–40.0)

## 2012-10-26 LAB — LDL CHOLESTEROL, DIRECT: Direct LDL: 140.2 mg/dL

## 2012-10-26 MED ORDER — TECHNETIUM TC 99M SESTAMIBI GENERIC - CARDIOLITE
10.0000 | Freq: Once | INTRAVENOUS | Status: AC | PRN
  Administered 2012-10-26: 10 via INTRAVENOUS

## 2012-10-26 MED ORDER — TECHNETIUM TC 99M SESTAMIBI GENERIC - CARDIOLITE
30.0000 | Freq: Once | INTRAVENOUS | Status: AC | PRN
  Administered 2012-10-26: 30 via INTRAVENOUS

## 2012-10-26 MED ORDER — REGADENOSON 0.4 MG/5ML IV SOLN
0.4000 mg | Freq: Once | INTRAVENOUS | Status: AC
  Administered 2012-10-26: 0.4 mg via INTRAVENOUS

## 2012-10-26 NOTE — Progress Notes (Signed)
[facility] SITE 3 NUCLEAR MED [address] [place], [place] [address] [phone]    Cardiology Nuclear Med Study  [name] is a 67 y.o. male     MRN : [id number]     DOB: 09/14/1945  Procedure Date: 10/26/2012  Nuclear Med Background Indication for Stress Test:  Evaluation for Ischemia History:  Cardioversion x 2, last one '06; '06 Ablation; '06 Cath:moderate 2-V CAD, medical tx; '11 MPS:no ischemia;'13 Echo:EF=65%; h/o atrial fibrillation Cardiac Risk Factors: History of Smoking, Hypertension and Lipids  Symptoms:  Chest Tightness with and without Exertion (last episode of chest discomfort was this a.m., 1-2/10, comes and goes; none now), DOE, Fatigue, Nausea, Palpitations and SOB   Nuclear Pre-Procedure Caffeine/Decaff Intake:  None NPO After: 8:00pm   Lungs:  Clear. O2 Sat: 99% on room air. IV 0.9% NS with Angio Cath:  20g  IV Site: R Antecubital  IV Started by:  Stanton Kidney, EMT-P  Chest Size (in):  44 Cup Size: n/a  Height: 6\' 1"  (1.854 m)  Weight:  223 lb (101.152 kg)  BMI:  Body mass index is 29.43 kg/(m^2). Tech Comments:  Med's were taken at 7:30am, per patient.    Nuclear Med Study 1 or 2 day study: 1 day  Stress Test Type:  Treadmill/Lexiscan  Reading MD: Charlton Haws, MD  Order Authorizing Provider:  Berton Mount, MD  Resting Radionuclide: Technetium 28m Sestamibi  Resting Radionuclide Dose: 11.0 mCi   Stress Radionuclide:  Technetium 70m Sestamibi  Stress Radionuclide Dose: 33.0 mCi           Stress Protocol Rest HR: 49 Stress HR: 83  Rest BP: 148/94 Stress BP: 216/68  Exercise Time (min): 8:03 METS: 7.0   Predicted Max HR: 152 bpm % Max HR: 54.61 bpm Rate Pressure Product: 64403   Dose of Adenosine (mg):  n/a Dose of Lexiscan: 0.4 mg  Dose of Atropine (mg): n/a Dose of Dobutamine: n/a mcg/kg/min (at max HR)  Stress Test Technologist: Smiley Houseman, CMA-N  Nuclear Technologist:  Domenic Polite, CNMT     Rest Procedure:  Myocardial  perfusion imaging was performed at rest 45 minutes following the intravenous administration of Technetium 54m Sestamibi.  Rest ECG: NSR-RBBB  Stress Procedure: Coreon initially walked on the treadmill utilizing the Bruce protocol, but was unable to reach his target heart rate, so he was changed to [employer].   The patient received IV Lexiscan 0.4 mg over 15-seconds with concurrent low level exercise and then Technetium 80m Sestamibi was injected at 30-seconds while the patient continued walking one more minute.  Quantitative spect images were obtained after a 45-minute delay.  Stress EKG's were discussed with Dr. Tenny Craw, DOD, and she said that it was safe for the patient to leave after stress images.  Stress ECG: No significant change from baseline ECG  QPS Raw Data Images:  Patient motion noted. Stress Images:  Normal homogeneous uptake in all areas of the myocardium. Rest Images:  Normal homogeneous uptake in all areas of the myocardium. Subtraction (SDS):  Normal Transient Ischemic Dilatation (Normal <1.22):  0.98 Lung/Heart Ratio (Normal <0.45):  0.39  Quantitative Gated Spect Images QGS EDV:  140 ml QGS ESV:  45 ml  Impression Exercise Capacity:  Lexiscan with low level exercise. BP Response:  Normal blood pressure response. Clinical Symptoms:  No significant symptoms noted. ECG Impression:  No significant ST segment change suggestive of ischemia. Comparison with Prior Nuclear Study: No images to compare  Overall Impression:  Normal stress nuclear study. Baseline ECG with trifasicular block and PR interval 396 msec  LV Ejection Fraction: 68%.  LV Wall Motion:  NL LV Function; NL Wall Motion  Charlton Haws

## 2012-10-30 ENCOUNTER — Telehealth: Payer: Self-pay | Admitting: Internal Medicine

## 2012-10-30 NOTE — Telephone Encounter (Signed)
New Problem:    Patient called in wanting to know the results of the Stress Test he had on 10/26/12.  Please call back.

## 2012-10-30 NOTE — Telephone Encounter (Signed)
Returned call to patient [name] results not available.Will send message to Dr.Klein's nurse.

## 2012-11-03 ENCOUNTER — Telehealth: Payer: Self-pay | Admitting: *Deleted

## 2012-11-03 NOTE — Telephone Encounter (Signed)
Please set up for traedmill for that date thx

## 2012-11-03 NOTE — Telephone Encounter (Signed)
Spoke with patient, he doesn't really want to try a statin due to side effects with Crestor  In the past. He will look at statin drugs and discuss with Dr Graciela Husbands at his next appt. His cholesterol was at 212 at his lab work and he says this has been consistent lately.  He would also like to discuss his heart rate and that it has been around 35 at home some and he has felt lethargic some. Has went from doing 40 laps in the pool to only being able to do a few laps now. He has been scheduled for 4/17 at 8:30

## 2012-11-05 NOTE — Telephone Encounter (Signed)
GXT scheduled for 4/17 per Melissa.

## 2012-11-19 ENCOUNTER — Encounter (HOSPITAL_COMMUNITY): Payer: Self-pay | Admitting: Pharmacy Technician

## 2012-11-19 ENCOUNTER — Ambulatory Visit (INDEPENDENT_AMBULATORY_CARE_PROVIDER_SITE_OTHER): Payer: Medicare Other | Admitting: Internal Medicine

## 2012-11-19 ENCOUNTER — Encounter: Payer: Medicare Other | Admitting: Internal Medicine

## 2012-11-19 ENCOUNTER — Ambulatory Visit: Payer: Medicare Other | Admitting: Internal Medicine

## 2012-11-19 ENCOUNTER — Encounter: Payer: Self-pay | Admitting: Internal Medicine

## 2012-11-19 ENCOUNTER — Encounter: Payer: Self-pay | Admitting: *Deleted

## 2012-11-19 DIAGNOSIS — I4891 Unspecified atrial fibrillation: Secondary | ICD-10-CM | POA: Diagnosis not present

## 2012-11-19 DIAGNOSIS — I4589 Other specified conduction disorders: Secondary | ICD-10-CM

## 2012-11-19 DIAGNOSIS — I440 Atrioventricular block, first degree: Secondary | ICD-10-CM

## 2012-11-19 LAB — CBC WITH DIFFERENTIAL/PLATELET
Basophils Absolute: 0 10*3/uL (ref 0.0–0.1)
Basophils Relative: 0.5 % (ref 0.0–3.0)
Eosinophils Relative: 5.3 % — ABNORMAL HIGH (ref 0.0–5.0)
Hemoglobin: 15.7 g/dL (ref 13.0–17.0)
Lymphocytes Relative: 31.1 % (ref 12.0–46.0)
Monocytes Relative: 9.8 % (ref 3.0–12.0)
Neutro Abs: 3.3 10*3/uL (ref 1.4–7.7)
RBC: 4.75 Mil/uL (ref 4.22–5.81)
RDW: 13.6 % (ref 11.5–14.6)
WBC: 6.1 10*3/uL (ref 4.5–10.5)

## 2012-11-19 LAB — BASIC METABOLIC PANEL
Calcium: 9.2 mg/dL (ref 8.4–10.5)
GFR: 92.57 mL/min (ref >60.00)
Sodium: 138 mEq/L (ref 135–145)

## 2012-11-19 NOTE — Patient Instructions (Signed)

## 2012-11-19 NOTE — Assessment & Plan Note (Signed)
Continue tikosyn.

## 2012-11-19 NOTE — Progress Notes (Signed)
Patient Care Team: Robert Reade, MD as PCP - General (Family Medicine)   HPI  [name] is a 66 y.o. male Seen in followup for atrial fibrillation for which the takes tikosyn initated may 13   He is doing better in sinus rhythm.   .  Thromboembolic risk profile is notable for a CHADS-VASc score of 3. He is on Rivaroxaban  He was seen last month with complaints of progressive exercise intolerance dyspnea. There is some accompanying chest discomfort. He underwent Myoview scanning demonstrated no ischemia. It was a stress test. It was notable for maximal heart rate of 83   This has been persistent.  Max HR wihile swimming was 70  Cardiac evaluation included a catheterization 2006 with mild 2 vessel disease and a negative Myoview 2011. Echocardiogram 2013 demonstrated normal left ventricular function    Past Medical History  Diagnosis Date  . Hypertension   . Hyperlipidemia   . CAD (coronary artery disease)     Mild 2 vessel disease per cath in 2006; myoview neg 2011  . Normal nuclear stress test 2011  . Tobacco abuse   . PVC's (premature ventricular contractions)   . Atrial flutter     s/p ablation 2006  . AF (atrial fibrillation)     recent new occurrence 3/13>>Tikosyn  . Depression 11-05-11    On Cymbalta  . Right ventricular enlargement     Mild noted echo 3/13  . Chronic anticoagulation     on Xarelto - started 11/12/11  . Shortness of breath   . GERD (gastroesophageal reflux disease)   . 1St degree AV block     Past Surgical History  Procedure Laterality Date  . Inguinal hernia repair  01/28/2008    Recurrent right inguinal hernia Laparoscopic, preperitoneal repair of recurrent right inguinal hernia with mesh -- SURGEON:  Dr. Haywood Ingram.  . Vasectomy    . Cardiac catheterization  11/1988, 12/07/2004    showing essentially normal left  ventricular function, moderate two-vessel disease (50% LAD after the third diagonal and a 60+% stenosis in the second obtuse  marginal)         . Tee with cardioversion  2001, 2006    Invasive electrophysiology study, electroanatomical mapping,and radiofrequency catheter ablation. -- patient's arrhythmia was isthmus dependent flutter not withstanding the very long cycle length.  RF energy delivered across the cavotricuspid isthmus successfully interrupted conduction across the isthmus and eliminated the patient's sub-straight. PHYSICIAN:  Meir Elwood C. Izyan Ezzell, M.D  . Foot surgery  1996    left-retained hardware  . Inguinal hernia repair  11/06/2011    Procedure: HERNIA REPAIR INGUINAL ADULT;  Surgeon: Haywood M Ingram, MD;  Location: [facility];  Service: General;  Laterality: Left;  . Hernia repair    . Tee without cardioversion  12/31/2011    Procedure: TRANSESOPHAGEAL ECHOCARDIOGRAM (TEE);  Surgeon: Peter C Nishan, MD;  Location: [facility];  Service: Cardiovascular;  Laterality: N/A;  tba after for tikosyn    Current Outpatient Prescriptions  Medication Sig Dispense Refill  . buPROPion (WELLBUTRIN XL) 300 MG 24 hr tablet Take 300 mg by mouth daily.      . dofetilide (TIKOSYN) 500 MCG capsule Take 1 capsule (500 mcg total) by mouth every 12 (twelve) hours.  180 capsule  6  . escitalopram (LEXAPRO) 20 MG tablet Take 20 mg by mouth daily.      . Rivaroxaban (XARELTO) 20 MG TABS Take 1 tablet (20 mg total) by mouth daily.  90 tablet    3   No current facility-administered medications for this visit.    Allergies  Allergen Reactions  . Horse-Derived Products Hives    Review of Systems negative except from HPI and PMH  Physical Exam   Pulse 54 Well developed and well nourished in no acute distress HENT normal E scleral and icterus clear Neck Supple   Clear to ausculation Slow but Regular rate and rhythm, no murmurs gallops or rub   No   Edema Alert and oriented, grossly normal motor and sensory function Skin Warm and Dry    Assessment and  Plan  11/19/2012   

## 2012-11-19 NOTE — Assessment & Plan Note (Signed)
The patient has significant chronotropic incompetence. He is limited to about 50% of his predicted maximal heart rate. He is resting rates in the 30s. He is quite symptomatic with limitations exercise tolerance. We have discussed the value of pacing. We have discussed the utility of rate sensors would anticipate a dual sensor system as he is quite fit.  He also significant first degree AV block which may further aggravate exercise intolerance and in the context of right bundle branch block might be amenable to right ventricular apical pacing. His ejection fraction is greater than 50 so recent guidelines allow for pacing with CRT would not apply. The benefits and risks were reviewed including but not limited to death,  perforation, infection, lead dislodgement and device malfunction.  The patient understands agrees and is willing to proceed.

## 2012-11-22 MED ORDER — SODIUM CHLORIDE 0.9 % IR SOLN
80.0000 mg | Status: DC
  Filled 2012-11-22 (×2): qty 2

## 2012-11-22 MED ORDER — CEFAZOLIN SODIUM-DEXTROSE 2-3 GM-% IV SOLR
2.0000 g | INTRAVENOUS | Status: DC
  Filled 2012-11-22 (×2): qty 50

## 2012-11-23 ENCOUNTER — Encounter (HOSPITAL_COMMUNITY)
Admission: RE | Disposition: A | Discharge status: Home / Self Care | Payer: Self-pay | Source: Ambulatory Visit | Attending: Internal Medicine

## 2012-11-23 ENCOUNTER — Ambulatory Visit (HOSPITAL_COMMUNITY)
Admission: RE | Admit: 2012-11-23 | Discharge: 2012-11-24 | Disposition: A | Discharge status: Home / Self Care | Payer: Medicare Other | Source: Ambulatory Visit | Attending: Internal Medicine | Admitting: Internal Medicine

## 2012-11-23 ENCOUNTER — Encounter (HOSPITAL_COMMUNITY): Payer: Self-pay | Admitting: General Practice

## 2012-11-23 DIAGNOSIS — I4589 Other specified conduction disorders: Secondary | ICD-10-CM

## 2012-11-23 DIAGNOSIS — Z7901 Long term (current) use of anticoagulants: Secondary | ICD-10-CM | POA: Insufficient documentation

## 2012-11-23 DIAGNOSIS — I2510 Atherosclerotic heart disease of native coronary artery without angina pectoris: Secondary | ICD-10-CM | POA: Insufficient documentation

## 2012-11-23 DIAGNOSIS — F329 Major depressive disorder, single episode, unspecified: Secondary | ICD-10-CM | POA: Insufficient documentation

## 2012-11-23 DIAGNOSIS — R002 Palpitations: Secondary | ICD-10-CM

## 2012-11-23 DIAGNOSIS — R0602 Shortness of breath: Secondary | ICD-10-CM | POA: Diagnosis not present

## 2012-11-23 DIAGNOSIS — I10 Essential (primary) hypertension: Secondary | ICD-10-CM | POA: Diagnosis not present

## 2012-11-23 DIAGNOSIS — E785 Hyperlipidemia, unspecified: Secondary | ICD-10-CM | POA: Insufficient documentation

## 2012-11-23 DIAGNOSIS — I495 Sick sinus syndrome: Secondary | ICD-10-CM | POA: Diagnosis not present

## 2012-11-23 DIAGNOSIS — R079 Chest pain, unspecified: Secondary | ICD-10-CM

## 2012-11-23 DIAGNOSIS — I38 Endocarditis, valve unspecified: Secondary | ICD-10-CM | POA: Insufficient documentation

## 2012-11-23 DIAGNOSIS — I4891 Unspecified atrial fibrillation: Secondary | ICD-10-CM | POA: Diagnosis not present

## 2012-11-23 DIAGNOSIS — K4091 Unilateral inguinal hernia, without obstruction or gangrene, recurrent: Secondary | ICD-10-CM

## 2012-11-23 DIAGNOSIS — F3289 Other specified depressive episodes: Secondary | ICD-10-CM | POA: Insufficient documentation

## 2012-11-23 DIAGNOSIS — I440 Atrioventricular block, first degree: Secondary | ICD-10-CM

## 2012-11-23 HISTORY — DX: Sick sinus syndrome: I49.5

## 2012-11-23 HISTORY — PX: PERMANENT PACEMAKER INSERTION: SHX5480

## 2012-11-23 HISTORY — PX: PACEMAKER INSERTION: SHX728

## 2012-11-23 LAB — SURGICAL PCR SCREEN
MRSA, PCR: NEGATIVE
Staphylococcus aureus: NEGATIVE

## 2012-11-23 SURGERY — PERMANENT PACEMAKER INSERTION
Anesthesia: LOCAL

## 2012-11-23 MED ORDER — MIDAZOLAM HCL 5 MG/5ML IJ SOLN
INTRAMUSCULAR | Status: AC
  Filled 2012-11-23: qty 5

## 2012-11-23 MED ORDER — HEPARIN (PORCINE) IN NACL 2-0.9 UNIT/ML-% IJ SOLN
INTRAMUSCULAR | Status: AC
  Filled 2012-11-23: qty 500

## 2012-11-23 MED ORDER — MUPIROCIN 2 % EX OINT
TOPICAL_OINTMENT | CUTANEOUS | Status: AC
  Filled 2012-11-23: qty 22

## 2012-11-23 MED ORDER — FENTANYL CITRATE 0.05 MG/ML IJ SOLN
INTRAMUSCULAR | Status: AC
  Filled 2012-11-23: qty 2

## 2012-11-23 MED ORDER — DOFETILIDE 500 MCG PO CAPS
500.0000 ug | ORAL_CAPSULE | Freq: Two times a day (BID) | ORAL | Status: DC
  Administered 2012-11-23 – 2012-11-24 (×2): 500 ug via ORAL
  Filled 2012-11-23 (×4): qty 1

## 2012-11-23 MED ORDER — SODIUM CHLORIDE 0.9 % IV SOLN
INTRAVENOUS | Status: AC
  Administered 2012-11-23: 11:00:00 via INTRAVENOUS

## 2012-11-23 MED ORDER — SODIUM CHLORIDE 0.9 % IV SOLN
INTRAVENOUS | Status: DC
  Administered 2012-11-23: 07:00:00 via INTRAVENOUS

## 2012-11-23 MED ORDER — CEFAZOLIN SODIUM 1-5 GM-% IV SOLN
1.0000 g | Freq: Four times a day (QID) | INTRAVENOUS | Status: AC
  Administered 2012-11-23 (×3): 1 g via INTRAVENOUS
  Filled 2012-11-23 (×3): qty 50

## 2012-11-23 MED ORDER — ONDANSETRON HCL 4 MG/2ML IJ SOLN: 4.0000 mg | Freq: Four times a day (QID) | INTRAMUSCULAR | Status: DC | PRN

## 2012-11-23 MED ORDER — BUPROPION HCL ER (XL) 300 MG PO TB24
300.0000 mg | ORAL_TABLET | Freq: Every day | ORAL | Status: DC
  Administered 2012-11-24: 300 mg via ORAL
  Filled 2012-11-23: qty 1

## 2012-11-23 MED ORDER — MUPIROCIN 2 % EX OINT
TOPICAL_OINTMENT | Freq: Two times a day (BID) | CUTANEOUS | Status: DC
  Administered 2012-11-23: 1 via NASAL
  Filled 2012-11-23: qty 22

## 2012-11-23 MED ORDER — ACETAMINOPHEN 325 MG PO TABS
325.0000 mg | ORAL_TABLET | ORAL | Status: DC | PRN
  Administered 2012-11-23: 650 mg via ORAL
  Filled 2012-11-23: qty 2

## 2012-11-23 MED ORDER — DEXTROSE-NACL 5-0.45 % IV SOLN
INTRAVENOUS | Status: DC
  Administered 2012-11-23: 07:00:00 via INTRAVENOUS

## 2012-11-23 MED ORDER — ESCITALOPRAM OXALATE 10 MG PO TABS
10.0000 mg | ORAL_TABLET | Freq: Every day | ORAL | Status: DC
  Administered 2012-11-24: 10 mg via ORAL
  Filled 2012-11-23: qty 1

## 2012-11-23 MED ORDER — LIDOCAINE HCL (PF) 1 % IJ SOLN
INTRAMUSCULAR | Status: AC
  Filled 2012-11-23: qty 60

## 2012-11-23 NOTE — CV Procedure (Signed)
Preop DX::chronotropic incompetence Post op DX:: same  Procedure  dual pacemaker implantation  After routine prep and drape, lidocaine was infiltrated in the prepectoral subclavicular region on the left side an incision was made and carried down to later the prepectoral fascia using electrocautery and sharp dissection a pocket was formed similarly. Hemostasis was obtained.  After this, we turned our attention to gaining accessm to the extrathoracic,left subclavian vein. This was accomplished without difficulty and without the aspiration of air or puncture of the artery. 2 separate venipunctures were accomplished; guidewires were placed and retained and sequentially 7 French sheath through which were  passed a Medtronic 5076  ventricular lead serial [id number]  and an Medtronic 5076 atrial lead serial number [id number] .  The ventricular lead was manipulated to the right ventricular apex with a bipolar R wave was 18.3, the pacing impedance was 1042, the threshold was 1 @ 0.4 msec  Current at threshold was   1.0  Ma and the current of injury was  modest.  The right atrial lead was manipulated to the right atrial appendage with a bipolar P-wave  2.1, the pacing impedance was 768, the threshold 0.9@ 0.4 msec   Current at threshold was 1.2  Ma and the current of injury was brisk.  The ventricular lead was marked with a tie prior to the insertion of the atrial lead. The leads were affixed to the prepectoral fascia and attached to a  [employer] pulse generator serial number [id number].  Hemostasis was obtained. The pocket was copiously irrigated with antibiotic containing saline solution. The leads and the pulse generator were placed in the pocket and affixed to the prepectoral fascia. The wound was then closed in 3 layers in the normal fashion. The wound was washed dried and a benzoin Steri-Strip dressing was applied.  Needle  Count, sponge counts and instrument counts were correct at the end of  the procedure .   The patient tolerated the procedure without apparent complication.  Gerlene Burdock.D.

## 2012-11-23 NOTE — H&P (View-Only) (Signed)
Patient Care Team: [name], MD as PCP - General (Family Medicine)   HPI  [name] is a 67 y.o. male Seen in followup for atrial fibrillation for which the takes tikosyn initated may 13   He is doing better in sinus rhythm.   .  Thromboembolic risk profile is notable for a CHADS-VASc score of 3. He is on Rivaroxaban  He was seen last month with complaints of progressive exercise intolerance dyspnea. There is some accompanying chest discomfort. He underwent Myoview scanning demonstrated no ischemia. It was a stress test. It was notable for maximal heart rate of 83   This has been persistent.  Max HR wihile swimming was 70  Cardiac evaluation included a catheterization 2006 with mild 2 vessel disease and a negative Myoview 2011. Echocardiogram 2013 demonstrated normal left ventricular function    Past Medical History  Diagnosis Date  . Hypertension   . Hyperlipidemia   . CAD (coronary artery disease)     Mild 2 vessel disease per cath in 2006; myoview neg 2011  . Normal nuclear stress test 2011  . Tobacco abuse   . PVC's (premature ventricular contractions)   . Atrial flutter     s/p ablation 2006  . AF (atrial fibrillation)     recent new occurrence 3/13>>Tikosyn  . Depression 11-05-11    On Cymbalta  . Right ventricular enlargement     Mild noted echo 3/13  . Chronic anticoagulation     on Xarelto - started 11/12/11  . Shortness of breath   . GERD (gastroesophageal reflux disease)   . 1St degree AV block     Past Surgical History  Procedure Laterality Date  . Inguinal hernia repair  01/28/2008    Recurrent right inguinal hernia Laparoscopic, preperitoneal repair of recurrent right inguinal hernia with mesh -- SURGEON:  Dr. Claud Kelp.  . Vasectomy    . Cardiac catheterization  11/1988, 12/07/2004    showing essentially normal left  ventricular function, moderate two-vessel disease (50% LAD after the third diagonal and a 60+% stenosis in the second obtuse  marginal)         . Tee with cardioversion  2001, 2006    Invasive electrophysiology study, electroanatomical mapping,and radiofrequency catheter ablation. -- patient's arrhythmia was isthmus dependent flutter not withstanding the very long cycle length.  RF energy delivered across the cavotricuspid isthmus successfully interrupted conduction across the isthmus and eliminated the patient's sub-straight. PHYSICIAN:  Duke Salvia, M.D  . Foot surgery  1996    left-retained hardware  . Inguinal hernia repair  11/06/2011    Procedure: HERNIA REPAIR INGUINAL ADULT;  Surgeon: Ernestene Mention, MD;  Location: [facility];  Service: General;  Laterality: Left;  . Hernia repair    . Tee without cardioversion  12/31/2011    Procedure: TRANSESOPHAGEAL ECHOCARDIOGRAM (TEE);  Surgeon: Wendall Stade, MD;  [facility];  Service: Cardiovascular;  Laterality: N/A;  tba after for tikosyn    Current Outpatient Prescriptions  Medication Sig Dispense Refill  . buPROPion (WELLBUTRIN XL) 300 MG 24 hr tablet Take 300 mg by mouth daily.      [name] dofetilide (TIKOSYN) 500 MCG capsule Take 1 capsule (500 mcg total) by mouth every 12 (twelve) hours.  180 capsule  6  . escitalopram (LEXAPRO) 20 MG tablet Take 20 mg by mouth daily.      . Rivaroxaban (XARELTO) 20 MG TABS Take 1 tablet (20 mg total) by mouth daily.  90 tablet  3   No current facility-administered medications for this visit.    Allergies  Allergen Reactions  . Horse-Derived Products Hives    Review of Systems negative except from HPI and PMH  Physical Exam   Pulse 54 Well developed and well nourished in no acute distress HENT normal E scleral and icterus clear Neck Supple   Clear to ausculation Slow but Regular rate and rhythm, no murmurs gallops or rub   No   Edema Alert and oriented, grossly normal motor and sensory function Skin Warm and Dry    Assessment and  Plan  11/19/2012

## 2012-11-23 NOTE — Interval H&P Note (Signed)
History and Physical Interval Note:  11/23/2012 7:34 AM  [name]  has presented today for surgery, with the diagnosis of cim  The various methods of treatment have been discussed with the patient and family. After consideration of risks, benefits and other options for treatment, the patient has consented to  Procedure(s): PERMANENT PACEMAKER INSERTION (N/A) as a surgical intervention .  The patient's history has been reviewed, patient examined, no change in status, stable for surgery.  I have reviewed the patient's chart and labs.  Questions were answered to the patient's satisfaction.     Sherryl Manges

## 2012-11-24 ENCOUNTER — Ambulatory Visit (HOSPITAL_COMMUNITY): Payer: Medicare Other

## 2012-11-24 DIAGNOSIS — I2510 Atherosclerotic heart disease of native coronary artery without angina pectoris: Secondary | ICD-10-CM | POA: Diagnosis not present

## 2012-11-24 DIAGNOSIS — Z950 Presence of cardiac pacemaker: Secondary | ICD-10-CM | POA: Diagnosis not present

## 2012-11-24 DIAGNOSIS — I10 Essential (primary) hypertension: Secondary | ICD-10-CM | POA: Diagnosis not present

## 2012-11-24 DIAGNOSIS — I4589 Other specified conduction disorders: Secondary | ICD-10-CM

## 2012-11-24 DIAGNOSIS — I495 Sick sinus syndrome: Secondary | ICD-10-CM | POA: Diagnosis not present

## 2012-11-24 DIAGNOSIS — I4891 Unspecified atrial fibrillation: Secondary | ICD-10-CM | POA: Diagnosis not present

## 2012-11-24 MED ORDER — RIVAROXABAN 20 MG PO TABS: 20.0000 mg | ORAL_TABLET | Freq: Every day | ORAL | Status: DC

## 2012-11-24 NOTE — Discharge Summary (Signed)
ELECTROPHYSIOLOGY DISCHARGE SUMMARY    Patient ID: [name],  MRN: [id number], DOB/AGE: 1946-01-09 67 y.o.  Admit date: 11/23/2012 Discharge date: 11/24/2012  Primary Care Physician: Lolita Patella, MD Primary Cardiologist: Graciela Husbands, MD  Primary Discharge Diagnosis:  1. 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 PAF 2. CAD 3. HTN 4. Dyslipidemia 5. Atrial flutter s/p ablation 2006 6. Depression 7. GERD  Procedures This Admission:  1. Dual chamber PPM implantation 11/22/2012 RA lead - Medtronic 5076 atrial lead serial number [id number]  RV lead - Medtronic 5076 ventricular lead serial number [id number]  Device - [employer] pulse generator serial number [id number]  History and Hospital Course:  [name] is a pleasant 67 year old man with PAF on Tikosyn, CAD and HTN who has experienced progressive exercise tolerance and DOE. He underwent a Myoview stress test which was negative for ischemia. However, his maximum HR was 83 bpm. His max HR while swimming was noted to be 70 bpm. He has normal LV function. Given his sinus node dysfunction/chronotropic incompetence, he presented yesterday for dual chamber PPM implantation. [name] tolerated this procedure well without any immediate complication. He remains hemodynamically stable and afebrile. His chest xray shows stable lead placement without pneumothorax. His device interrogation shows normal PPM function with stable lead parameters/measurements. His implant site is intact without significant bleeding or hematoma. He has been given discharge instructions including wound care and activity restrictions. He will follow-up in 10 days for wound check. He will resume Xarelto on Thursday, 11/26/2012; otherwise there were no changes made to his medications. He has been seen, examined and deemed stable for discharge today by Dr. Berton Mount.   Discharge Vitals: Blood  pressure 150/84, pulse 60, temperature 98.2 F (36.8 C), temperature source Oral, resp. rate 18, height 6\' 1"  (1.854 m), weight 220 lb 6.4 oz (99.973 kg), SpO2 94.00%.   Labs: Lab Results  Component Value Date   WBC 6.1 11/19/2012   HGB 15.7 11/19/2012   HCT 47.0 11/19/2012   MCV 98.9 11/19/2012   PLT 222.0 11/19/2012    Recent Labs Lab 11/19/12 0952  NA 138  K 4.5  CL 104  CO2 26  BUN 14  CREATININE 0.9  CALCIUM 9.2  GLUCOSE 93     Disposition:  The patient is being discharged in stable condition.  Follow-up: Follow-up Information   Follow up with [facility] On 12/03/2012. (At 9:30 AM for wound check)    Contact information:   [address] [place] [address] [phone]      Follow up with Sherryl Manges, MD On 02/25/2013. (At 2:00 PM)    Contact information:   [address] [place] [address] [phone]     Discharge Medications:    Medication List    TAKE these medications       buPROPion 300 MG 24 hr tablet  Commonly known as:  WELLBUTRIN XL  Take 300 mg by mouth daily.     dofetilide 500 MCG capsule  Commonly known as:  TIKOSYN  Take 1 capsule (500 mcg total) by mouth every 12 (twelve) hours.     escitalopram 10 MG tablet  Commonly known as:  LEXAPRO  Take 10 mg by mouth daily.     Fish Oil 1000 MG Caps  Take 1,000 mg by mouth 2 (two) times daily.     Rivaroxaban 20 MG Tabs  Commonly known as:  [name]  Take 1 tablet (20 mg total) by mouth daily with supper. RESTART on Thursday, 11/26/12.       Duration of Discharge Encounter: Greater than 30 minutes including physician time.  Signed, Rick Duff, PA-C 11/24/2012, 10:55 AM

## 2012-11-24 NOTE — Progress Notes (Signed)
     Patient: [name] Date of Encounter: 11/24/2012, 8:25 AM Admit date: 11/23/2012     Subjective  Mr. Staton has no complaints this AM.    Objective  Physical Exam: Vitals: BP 150/84  Pulse 60  Temp(Src) 98.2 F (36.8 C) (Oral)  Resp 18  Ht 6\' 1"  (1.854 m)  Wt 220 lb 6.4 oz (99.973 kg)  BMI 29.08 kg/m2  SpO2 94% General: Well developed, well appearing 67 year old male in no acute distress. Neck: Supple. JVD not elevated. Lungs: Clear bilaterally to auscultation without wheezes, rales, or rhonchi. Breathing is unlabored. Heart: RRR S1 S2 without murmur, rub or gallop.  Abdomen: Soft, non-distended. Extremities: No clubbing or cyanosis. No edema.  Distal pedal pulses are 2+ and equal bilaterally. Neuro: Alert and oriented X 3. Moves all extremities spontaneously. No focal deficits. Skin: Left upper chest/implant site intact without bleeding or hematoma.  Intake/Output:  Intake/Output Summary (Last 24 hours) at 11/24/12 0825 Last data filed at 11/23/12 1700  Gross per 24 hour  Intake 1104.17 ml  Output    675 ml  Net 429.17 ml    Inpatient Medications:  . buPROPion  300 mg Oral Daily  . dofetilide  500 mcg Oral Q12H  . escitalopram  10 mg Oral Daily  . mupirocin ointment   Nasal BID    Labs: None  Radiology/Studies: CXR pending this AM Device interrogation: performed by industry this AM shows normal PPM function Telemetry: SR; no arrhythmias    Assessment and Plan  1. Sinus node dysfunction s/p PPM implant yesterday Mr. Flemister is doing well post PPM implant. His implant site is intact without bleeding or hematoma. His device interrogation shows normal PPM function. His chest x-ray is pending. If his x-ray shows stable lead placement without PTX, will DC home today. Wound care, activity restrictions and follow-up reviewed.   Dr. Graciela Husbands to see Signed, EDMISTEN, BROOKE PA-C  As above No chest presssure overnight>>this was relieved in the context of  pacemaker contniue tikosyn Resume  Rivaroxaban on Thursday pm

## 2012-11-28 ENCOUNTER — Encounter: Payer: Self-pay | Admitting: Internal Medicine

## 2012-12-03 ENCOUNTER — Ambulatory Visit (INDEPENDENT_AMBULATORY_CARE_PROVIDER_SITE_OTHER): Payer: Medicare Other | Admitting: *Deleted

## 2012-12-03 ENCOUNTER — Other Ambulatory Visit: Payer: Self-pay

## 2012-12-03 DIAGNOSIS — I4589 Other specified conduction disorders: Secondary | ICD-10-CM | POA: Diagnosis not present

## 2012-12-03 DIAGNOSIS — I4891 Unspecified atrial fibrillation: Secondary | ICD-10-CM

## 2012-12-03 LAB — PACEMAKER DEVICE OBSERVATION
AL AMPLITUDE: 4.5 mv
ATRIAL PACING PM: 98
DEVICE MODEL PM: 112305
RV LEAD THRESHOLD: 0.7 V
VENTRICULAR PACING PM: 0

## 2012-12-03 NOTE — Progress Notes (Signed)
Wound check-PPM 

## 2012-12-28 ENCOUNTER — Encounter: Payer: Self-pay | Admitting: Internal Medicine

## 2013-01-07 ENCOUNTER — Encounter: Payer: Self-pay | Admitting: Internal Medicine

## 2013-01-28 ENCOUNTER — Other Ambulatory Visit: Payer: Self-pay | Admitting: Internal Medicine

## 2013-02-24 ENCOUNTER — Encounter: Payer: Self-pay | Admitting: *Deleted

## 2013-02-25 ENCOUNTER — Ambulatory Visit (INDEPENDENT_AMBULATORY_CARE_PROVIDER_SITE_OTHER): Payer: Medicare Other | Admitting: Internal Medicine

## 2013-02-25 ENCOUNTER — Encounter: Payer: Self-pay | Admitting: Internal Medicine

## 2013-02-25 VITALS — BP 121/71 | HR 60 | Ht 73.0 in | Wt 223.8 lb

## 2013-02-25 DIAGNOSIS — R0609 Other forms of dyspnea: Secondary | ICD-10-CM

## 2013-02-25 DIAGNOSIS — R079 Chest pain, unspecified: Secondary | ICD-10-CM

## 2013-02-25 DIAGNOSIS — I4589 Other specified conduction disorders: Secondary | ICD-10-CM | POA: Diagnosis not present

## 2013-02-25 DIAGNOSIS — I495 Sick sinus syndrome: Secondary | ICD-10-CM | POA: Diagnosis not present

## 2013-02-25 DIAGNOSIS — I4891 Unspecified atrial fibrillation: Secondary | ICD-10-CM

## 2013-02-25 DIAGNOSIS — R0989 Other specified symptoms and signs involving the circulatory and respiratory systems: Secondary | ICD-10-CM | POA: Diagnosis not present

## 2013-02-25 DIAGNOSIS — Z950 Presence of cardiac pacemaker: Secondary | ICD-10-CM | POA: Diagnosis not present

## 2013-02-25 DIAGNOSIS — I440 Atrioventricular block, first degree: Secondary | ICD-10-CM

## 2013-02-25 LAB — PACEMAKER DEVICE OBSERVATION
AL AMPLITUDE: 3.7 mv
AL THRESHOLD: 0.4 V
ATRIAL PACING PM: 78
DEVICE MODEL PM: 112305
RV LEAD IMPEDENCE PM: 597 Ohm
RV LEAD THRESHOLD: 0.6 V

## 2013-02-25 NOTE — Assessment & Plan Note (Signed)
The patient's device was interrogated and the information was fully reviewed.  The device was reprogrammed as above. 

## 2013-02-25 NOTE — Assessment & Plan Note (Signed)
As above.

## 2013-02-25 NOTE — Assessment & Plan Note (Signed)
I am little bit concerned that with his history of coronary disease and not withstanding his negative Myoview that  chest pain associated with more rapid rates is uncovering  occult coronary disease. If the symptoms persist we'll undertake catheterization

## 2013-02-25 NOTE — Assessment & Plan Note (Signed)
Profound. It may well be worth undertaking stress testing to see what happens to the AV interval during exercise. It may be that a more physiologic AV interval to be hemodynamically better

## 2013-02-25 NOTE — Assessment & Plan Note (Signed)
This is improved. It is interesting to contemplate Y. Swimming is a problem. It is possible that the rhythmic respiratory rate and a fixed tidal volume does not promote increased heart rate. I have made a minute ventilations sensor more sensitive

## 2013-02-25 NOTE — Progress Notes (Signed)
Patient Care Team: Paul Else, MD as PCP - General (Family Medicine)   HPI  [name] is a 67 y.o. male Seen in followup for atrial fibrillation for which the takes tikosyn initated may 13  He is doing better in sinus rhythm.   Thromboembolic risk profile is notable for a CHADS-VASc score of 3. He is on Rivaroxaban    He was seen last month with complaints of progressive exercise intolerance dyspnea. There is some accompanying chest discomfort. He underwent Myoview scanning demonstrated no ischemia. It was a stress test. It was notable for maximal heart rate of 83  This has been persistent. Max HR wihile swimming was 70.  Because of chronotropic incompetence he underwent pacemaker implantation 4/14 receiving [employer] dual sensor system    While he has been swimming he has noted difficulty generating heart rate was about 80-90. In the ventricle, he is generating a heart rate of 110-20. The latter has been at least on one occasion associated with chest discomfort.  He's also had an episode where he felt weak; this was associated with a detected heart rate of 42.  Cardiac evaluation included a catheterization 2006 with mild 2 vessel disease and a negative Myoview 2014 Echocardiogram 2013 demonstrated normal left ventricular function   Past Medical History  Diagnosis Date  . Hypertension   . Hyperlipidemia   . CAD (coronary artery disease)     Mild 2 vessel disease per cath in 2006; myoview neg 2011  . Normal nuclear stress test 2011  . Tobacco abuse   . PVC's (premature ventricular contractions)   . Atrial flutter     s/p ablation 2006  . AF (atrial fibrillation)     recent new occurrence 3/13>>Tikosyn  . Depression 11-05-11    On Cymbalta  . Right ventricular enlargement     Mild noted echo 3/13  . Chronic anticoagulation     on Xarelto - started 11/12/11  . Shortness of breath   . GERD (gastroesophageal reflux disease)   . 1St degree AV block   . Pacemaker  11/23/2012    Dr Graciela Husbands    Past Surgical History  Procedure Laterality Date  . Inguinal hernia repair  01/28/2008    Recurrent right inguinal hernia Laparoscopic, preperitoneal repair of recurrent right inguinal hernia with mesh -- SURGEON:  Dr. Claud Kelp.  . Vasectomy    . Cardiac catheterization  11/1988, 12/07/2004    showing essentially normal left  ventricular function, moderate two-vessel disease (50% LAD after the third diagonal and a 60+% stenosis in the second obtuse marginal)         . Tee with cardioversion  2001, 2006    Invasive electrophysiology study, electroanatomical mapping,and radiofrequency catheter ablation. -- patient's arrhythmia was isthmus dependent flutter not withstanding the very long cycle length.  RF energy delivered across the cavotricuspid isthmus successfully interrupted conduction across the isthmus and eliminated the patient's sub-straight. PHYSICIAN:  Duke Salvia, M.D  . Foot surgery  1996    left-retained hardware  . Inguinal hernia repair  11/06/2011    Procedure: HERNIA REPAIR INGUINAL ADULT;  Surgeon: Ernestene Mention, MD;  Location: [facility];  Service: General;  Laterality: Left;  . Hernia repair    . Tee without cardioversion  12/31/2011    Procedure: TRANSESOPHAGEAL ECHOCARDIOGRAM (TEE);  Surgeon: Wendall Stade, MD;  Location: [facility] ENDOSCOPY;  Service: Cardiovascular;  Laterality: N/A;  tba after for tikosyn  . Pacemaker insertion  11/23/2012  Dr Graciela Husbands    Current Outpatient Prescriptions  Medication Sig Dispense Refill  . buPROPion (WELLBUTRIN XL) 300 MG 24 hr tablet Take 300 mg by mouth daily.      [name] dofetilide (TIKOSYN) 500 MCG capsule Take 1 capsule (500 mcg total) by mouth every 12 (twelve) hours.  180 capsule  6  . escitalopram (LEXAPRO) 10 MG tablet Take 20 mg by mouth daily.       . Omega-3 Fatty Acids (FISH OIL) 1000 MG CAPS Take 1,000 mg by mouth 2 (two) times daily.      [name] 20 MG TABS TAKE 1 TABLET BY MOUTH DAILY  90 tablet  1    No current facility-administered medications for this visit.    Allergies  Allergen Reactions  . Horse-Derived Products Hives    Review of Systems negative except from HPI and PMH  Physical Exam BP 121/71  Pulse 60  Ht 6\' 1"  (1.854 m)  Wt 223 lb 12.8 oz (101.515 kg)  BMI 29.53 kg/m2 In the event thata Well developed and well nourished in no acute distress HENT normal E scleral and icterus clear Neck Supple JVP flat; carotids brisk and full Clear to ausculation  Regular rate and rhythm, no murmurs gallops or rub Soft with active bowel sounds No clubbing cyanosis none Edema Alert and oriented, grossly normal motor and sensory function Skin Warm and Dry  ECG demonstrates atrial pacing at 60 with first degree AV block at 400 ms there is right bundle branch left axis deviation  Assessment and  Plan

## 2013-02-25 NOTE — Patient Instructions (Signed)
Your physician wants you to follow-up in: 3 months with Dr. Graciela Husbands. You will receive a reminder letter in the mail two months in advance. If you don't receive a letter, please call our office to schedule the follow-up appointment.  Your physician recommends that you continue on your current medications as directed. Please refer to the Current Medication list given to you today.

## 2013-03-17 ENCOUNTER — Other Ambulatory Visit: Payer: Self-pay | Admitting: Internal Medicine

## 2013-03-30 DIAGNOSIS — L570 Actinic keratosis: Secondary | ICD-10-CM | POA: Diagnosis not present

## 2013-03-30 DIAGNOSIS — D239 Other benign neoplasm of skin, unspecified: Secondary | ICD-10-CM | POA: Diagnosis not present

## 2013-03-30 DIAGNOSIS — L821 Other seborrheic keratosis: Secondary | ICD-10-CM | POA: Diagnosis not present

## 2013-03-30 DIAGNOSIS — L259 Unspecified contact dermatitis, unspecified cause: Secondary | ICD-10-CM | POA: Diagnosis not present

## 2013-03-30 DIAGNOSIS — Z85828 Personal history of other malignant neoplasm of skin: Secondary | ICD-10-CM | POA: Diagnosis not present

## 2013-04-26 DIAGNOSIS — F3289 Other specified depressive episodes: Secondary | ICD-10-CM | POA: Diagnosis not present

## 2013-04-26 DIAGNOSIS — F329 Major depressive disorder, single episode, unspecified: Secondary | ICD-10-CM | POA: Diagnosis not present

## 2013-05-17 DIAGNOSIS — M79609 Pain in unspecified limb: Secondary | ICD-10-CM

## 2013-05-17 DIAGNOSIS — B351 Tinea unguium: Secondary | ICD-10-CM

## 2013-05-26 ENCOUNTER — Ambulatory Visit (INDEPENDENT_AMBULATORY_CARE_PROVIDER_SITE_OTHER): Payer: Medicare Other | Admitting: *Deleted

## 2013-05-26 DIAGNOSIS — Z950 Presence of cardiac pacemaker: Secondary | ICD-10-CM

## 2013-05-26 LAB — PACEMAKER DEVICE OBSERVATION

## 2013-05-26 NOTE — Progress Notes (Signed)
Pt in today to review histograms due to rate response changes made on 02/25/13. Histograms better.   Pt states he feels better when exercising on elliptical.  However, he is concerned when swimming that his heart rate does not increase beyond 80bpm.  Prior to rate response changes, after swimming 2-3 laps he would get tired.  Today he swam 10 laps until stopping.  No device changes made today.  ROV w/ Dr. Graciela Husbands 11/2013.

## 2013-06-10 ENCOUNTER — Encounter: Payer: Self-pay | Admitting: Internal Medicine

## 2013-06-10 ENCOUNTER — Other Ambulatory Visit: Payer: Self-pay

## 2013-06-21 ENCOUNTER — Other Ambulatory Visit: Payer: Self-pay | Admitting: Internal Medicine

## 2013-08-09 ENCOUNTER — Other Ambulatory Visit: Payer: Self-pay | Admitting: Internal Medicine

## 2013-08-12 DIAGNOSIS — H40019 Open angle with borderline findings, low risk, unspecified eye: Secondary | ICD-10-CM | POA: Diagnosis not present

## 2013-08-12 DIAGNOSIS — H5210 Myopia, unspecified eye: Secondary | ICD-10-CM | POA: Diagnosis not present

## 2013-08-12 DIAGNOSIS — H2510 Age-related nuclear cataract, unspecified eye: Secondary | ICD-10-CM | POA: Diagnosis not present

## 2013-08-27 ENCOUNTER — Ambulatory Visit: Payer: Medicare Other | Admitting: *Deleted

## 2013-08-27 ENCOUNTER — Telehealth: Payer: Self-pay | Admitting: Internal Medicine

## 2013-08-27 NOTE — Telephone Encounter (Signed)
New message     Pt need to do a remote transmission but do not know how to do it.  pls call and walk him thru it

## 2013-08-27 NOTE — Telephone Encounter (Signed)
[facility] updating pt we successfully received his home monitor transmission.

## 2013-08-30 ENCOUNTER — Encounter: Payer: Self-pay | Admitting: Internal Medicine

## 2013-08-30 LAB — MDC_IDC_ENUM_SESS_TYPE_REMOTE
Implantable Pulse Generator Serial Number: 112305
Lead Channel Impedance Value: 575 Ohm
Lead Channel Impedance Value: 586 Ohm
Lead Channel Setting Pacing Amplitude: 2 V
MDC IDC MSMT LEADCHNL RV SENSING INTR AMPL: 5.3 mV
MDC IDC SET LEADCHNL RV SENSING SENSITIVITY: 2.5 mV
MDC IDC STAT BRADY RA PERCENT PACED: 77 %
MDC IDC STAT BRADY RV PERCENT PACED: 0 %

## 2013-09-01 DIAGNOSIS — H40019 Open angle with borderline findings, low risk, unspecified eye: Secondary | ICD-10-CM | POA: Diagnosis not present

## 2013-09-01 DIAGNOSIS — H2510 Age-related nuclear cataract, unspecified eye: Secondary | ICD-10-CM | POA: Diagnosis not present

## 2013-09-09 DIAGNOSIS — Z0000 Encounter for general adult medical examination without abnormal findings: Secondary | ICD-10-CM | POA: Diagnosis not present

## 2013-09-09 DIAGNOSIS — Z23 Encounter for immunization: Secondary | ICD-10-CM | POA: Diagnosis not present

## 2013-09-09 DIAGNOSIS — F329 Major depressive disorder, single episode, unspecified: Secondary | ICD-10-CM | POA: Diagnosis not present

## 2013-09-09 DIAGNOSIS — F3289 Other specified depressive episodes: Secondary | ICD-10-CM | POA: Diagnosis not present

## 2013-09-09 DIAGNOSIS — I4891 Unspecified atrial fibrillation: Secondary | ICD-10-CM | POA: Diagnosis not present

## 2013-09-09 DIAGNOSIS — Z1331 Encounter for screening for depression: Secondary | ICD-10-CM | POA: Diagnosis not present

## 2013-09-14 ENCOUNTER — Encounter: Payer: Self-pay | Admitting: *Deleted

## 2013-09-16 DIAGNOSIS — Z85828 Personal history of other malignant neoplasm of skin: Secondary | ICD-10-CM | POA: Diagnosis not present

## 2013-09-16 DIAGNOSIS — L570 Actinic keratosis: Secondary | ICD-10-CM | POA: Diagnosis not present

## 2013-09-16 DIAGNOSIS — L259 Unspecified contact dermatitis, unspecified cause: Secondary | ICD-10-CM | POA: Diagnosis not present

## 2013-09-16 DIAGNOSIS — D485 Neoplasm of uncertain behavior of skin: Secondary | ICD-10-CM | POA: Diagnosis not present

## 2013-09-16 DIAGNOSIS — D239 Other benign neoplasm of skin, unspecified: Secondary | ICD-10-CM | POA: Diagnosis not present

## 2013-09-17 DIAGNOSIS — L820 Inflamed seborrheic keratosis: Secondary | ICD-10-CM | POA: Diagnosis not present

## 2013-10-25 ENCOUNTER — Other Ambulatory Visit: Payer: Self-pay | Admitting: Internal Medicine

## 2013-11-12 ENCOUNTER — Other Ambulatory Visit: Payer: Self-pay | Admitting: Internal Medicine

## 2013-11-30 ENCOUNTER — Encounter: Payer: Self-pay | Admitting: Internal Medicine

## 2013-11-30 ENCOUNTER — Ambulatory Visit (INDEPENDENT_AMBULATORY_CARE_PROVIDER_SITE_OTHER): Payer: Medicare Other | Admitting: Internal Medicine

## 2013-11-30 VITALS — BP 142/80 | HR 60 | Ht 72.0 in | Wt 227.4 lb

## 2013-11-30 DIAGNOSIS — I4589 Other specified conduction disorders: Secondary | ICD-10-CM

## 2013-11-30 DIAGNOSIS — Z950 Presence of cardiac pacemaker: Secondary | ICD-10-CM

## 2013-11-30 DIAGNOSIS — I440 Atrioventricular block, first degree: Secondary | ICD-10-CM | POA: Diagnosis not present

## 2013-11-30 DIAGNOSIS — I4891 Unspecified atrial fibrillation: Secondary | ICD-10-CM | POA: Diagnosis not present

## 2013-11-30 LAB — MAGNESIUM: Magnesium: 2.1 mg/dL (ref 1.5–2.5)

## 2013-11-30 NOTE — Progress Notes (Signed)
Patient Care Team: [name], MD as PCP - General (Family Medicine)   HPI  [name] is a 68 y.o. male  Seen in followup for atrial fibrillation for which the takes tikosyn initated may 13  and underwent pacemaker implantation 4/14 her chronotropic incompetence received in a [employer] device.  He is doing better in sinus rhythm.   Thromboembolic risk profile is notable for a CHADS-VASc score of 3. He is on Rivaroxaban   He was seen last month with complaints of progressive exercise intolerance dyspnea. There is some accompanying chest discomfort. He underwent Myoview scanning demonstrated no ischemia. It was a stress test.   Past Medical History  Diagnosis Date  . Hypertension   . Hyperlipidemia   . CAD (coronary artery disease)     Mild 2 vessel disease per cath in 2006; myoview neg 2011  . Normal nuclear stress test 2011  . Tobacco abuse   . PVC's (premature ventricular contractions)   . Atrial flutter     s/p ablation 2006  . AF (atrial fibrillation)     recent new occurrence 3/13>>Tikosyn  . Depression 11-05-11    On Cymbalta  . Right ventricular enlargement     Mild noted echo 3/13  . Chronic anticoagulation     on Xarelto - started 11/12/11  . Shortness of breath   . GERD (gastroesophageal reflux disease)   . 1St degree AV block   . Pacemaker 11/23/2012    Dr Caryl Comes    Past Surgical History  Procedure Laterality Date  . Inguinal hernia repair  01/28/2008    Recurrent right inguinal hernia Laparoscopic, preperitoneal repair of recurrent right inguinal hernia with mesh -- SURGEON:  Dr. Fanny Skates.  . Vasectomy    . Cardiac catheterization  11/1988, 12/07/2004    showing essentially normal left  ventricular function, moderate two-vessel disease (50% LAD after the third diagonal and a 60+% stenosis in the second obtuse marginal)         . Tee with cardioversion  2001, 2006    Invasive electrophysiology study, electroanatomical mapping,and  radiofrequency catheter ablation. -- patient's arrhythmia was isthmus dependent flutter not withstanding the very long cycle length.  RF energy delivered across the cavotricuspid isthmus successfully interrupted conduction across the isthmus and eliminated the patient's sub-straight. PHYSICIAN:  Deboraha Sprang, M.D  . Foot surgery  1996    left-retained hardware  . Inguinal hernia repair  11/06/2011    Procedure: HERNIA REPAIR INGUINAL ADULT;  Surgeon: Adin Hector, MD;  Location: [facility];  Service: General;  Laterality: Left;  . Hernia repair    . Tee without cardioversion  12/31/2011    Procedure: TRANSESOPHAGEAL ECHOCARDIOGRAM (TEE);  Surgeon: Josue Hector, MD;  Location: [facility] ENDOSCOPY;  Service: Cardiovascular;  Laterality: N/A;  tba after for tikosyn  . Pacemaker insertion  11/23/2012    Dr Caryl Comes    Current Outpatient Prescriptions  Medication Sig Dispense Refill  . Omega-3 Fatty Acids (FISH OIL) 1000 MG CAPS Take 1,000 mg by mouth 2 (two) times daily.      [name] TIKOSYN 500 MCG capsule TAKE 1 CAPSULE EVERY 12 HOURS  180 capsule  0  . XARELTO 20 MG TABS tablet TAKE 1 TABLET DAILY  30 tablet  0   No current facility-administered medications for this visit.    Allergies  Allergen Reactions  . Horse-Derived Products Hives    Review of Systems negative except from HPI and PMH  Physical Exam BP 142/80  Pulse 60  Ht 6' (1.829 m)  Wt 227 lb 6.4 oz (103.148 kg)  BMI 30.83 kg/m2 Well developed and well nourished in no acute distress HENT normal E scleral and icterus clear Neck Supple JVP flat; carotids brisk and full Clear to ausculation  Regular rate and rhythm, no murmurs gallops or rub Soft with active bowel sounds No clubbing cyanosis none Edema Alert and oriented, grossly normal motor and sensory function Skin Warm and Dry  ECG demonstrates atrial pacing at a rate of 60 with a AR interval of 480 ms  Assessment and  Plan  Sinus node dysfunction  Atrial  Fibrillation  on tikosyn  Pacemaker  BSx  Hypertension  1AVB profound  Dyspnea on exertion  He has significant dyspnea on exertion. He finds that while swimming he is unable to generate a heart rate more than about 90. This is not some true when he does his elliptical.  He is aware of his PVCs. He has had not had significant atrial fibrillation. Potassium levels last month were normal; magnesium levels are pending today.  We have reprogrammed his device to 8 intervals 200 ms to see if we can improve cardiac performance by AV synchrony as well as having increased sensitivity of his ventilation sensor to hopefully allow him to swim.

## 2013-11-30 NOTE — Patient Instructions (Signed)
Your physician recommends that you continue on your current medications as directed. Please refer to the Current Medication list given to you today.  Your physician recommends that you return for lab work today: Magnesium  Remote monitoring is used to monitor your Pacemaker of ICD from home. This monitoring reduces the number of office visits required to check your device to one time per year. It allows [place] to keep an eye on the functioning of your device to ensure it is working properly. You are scheduled for a device check from home on 03/03/14. You may send your transmission at any time that day. If you have a wireless device, the transmission will be sent automatically. After your physician reviews your transmission, you will receive a postcard with your next transmission date.  Your physician recommends that you schedule a follow-up appointment in: 10 days with Dr. Caryl Comes.   Your physician wants you to follow-up in: 1 year with Dr. Caryl Comes.  You will receive a reminder letter in the mail two months in advance. If you don't receive a letter, please call our office to schedule the follow-up appointment.

## 2013-12-01 ENCOUNTER — Encounter: Payer: Self-pay | Admitting: Internal Medicine

## 2013-12-01 LAB — MDC_IDC_ENUM_SESS_TYPE_INCLINIC
Implantable Pulse Generator Serial Number: 112305
Lead Channel Impedance Value: 568 Ohm
Lead Channel Impedance Value: 630 Ohm
Lead Channel Pacing Threshold Amplitude: 0.6 V
Lead Channel Pacing Threshold Pulse Width: 0.4 ms
Lead Channel Sensing Intrinsic Amplitude: 4.7 mV
MDC IDC MSMT LEADCHNL RV PACING THRESHOLD AMPLITUDE: 0.8 V
MDC IDC MSMT LEADCHNL RV PACING THRESHOLD PULSEWIDTH: 0.4 ms
MDC IDC SESS DTM: 20150428040000
MDC IDC SET LEADCHNL RA PACING AMPLITUDE: 2 V
MDC IDC SET LEADCHNL RV SENSING SENSITIVITY: 2.5 mV
MDC IDC SET ZONE DETECTION INTERVAL: 375 ms
MDC IDC STAT BRADY RA PERCENT PACED: 66 %
MDC IDC STAT BRADY RV PERCENT PACED: 0 %

## 2013-12-01 NOTE — Telephone Encounter (Signed)
Called pt to discuss symptoms since programming changes. Pt reiterates same description of symptoms as in email. I cancelled his ROV w/ Dr. Caryl Comes on 12/09/13. He will come to device clinic 12/02/13 @ 2:00 for reprogramming; no charge visit.

## 2013-12-02 ENCOUNTER — Telehealth: Payer: Self-pay | Admitting: *Deleted

## 2013-12-02 ENCOUNTER — Ambulatory Visit (INDEPENDENT_AMBULATORY_CARE_PROVIDER_SITE_OTHER): Payer: Medicare Other | Admitting: *Deleted

## 2013-12-02 DIAGNOSIS — Z950 Presence of cardiac pacemaker: Secondary | ICD-10-CM

## 2013-12-02 LAB — MDC_IDC_ENUM_SESS_TYPE_INCLINIC
Brady Statistic RA Percent Paced: 77 %
Brady Statistic RV Percent Paced: 99 %
Implantable Pulse Generator Serial Number: 112305
Lead Channel Setting Sensing Sensitivity: 2.5 mV
MDC IDC MSMT LEADCHNL RA IMPEDANCE VALUE: 627 Ohm
MDC IDC MSMT LEADCHNL RV IMPEDANCE VALUE: 548 Ohm
MDC IDC SET LEADCHNL RA PACING AMPLITUDE: 2 V
Zone Setting Detection Interval: 375 ms

## 2013-12-02 NOTE — Telephone Encounter (Signed)
[name], from device clinic, called pt yesterday afternoon 4/29. Pt has appt with device clinic this afternoon, 4/30, to have device check/reprogrammed.

## 2013-12-02 NOTE — Progress Notes (Signed)
Pt symptomatic w/ change to DDDR setting w/ paced AV of 258ms. Changed mode back to AAIR. Pt will contact us if symptoms arise. Latitude NXT 03/03/14 & ROV w/ SK in 60mo.

## 2013-12-07 ENCOUNTER — Encounter: Payer: Self-pay | Admitting: Internal Medicine

## 2013-12-09 ENCOUNTER — Encounter: Payer: Medicare Other | Admitting: Internal Medicine

## 2013-12-10 ENCOUNTER — Encounter: Payer: Self-pay | Admitting: Internal Medicine

## 2013-12-17 ENCOUNTER — Encounter: Payer: Self-pay | Admitting: Internal Medicine

## 2013-12-17 ENCOUNTER — Ambulatory Visit (INDEPENDENT_AMBULATORY_CARE_PROVIDER_SITE_OTHER): Payer: Medicare Other | Admitting: Internal Medicine

## 2013-12-17 VITALS — BP 121/66 | HR 62 | Ht 72.0 in | Wt 228.0 lb

## 2013-12-17 DIAGNOSIS — I4589 Other specified conduction disorders: Secondary | ICD-10-CM | POA: Diagnosis not present

## 2013-12-17 DIAGNOSIS — Z950 Presence of cardiac pacemaker: Secondary | ICD-10-CM

## 2013-12-17 DIAGNOSIS — R0989 Other specified symptoms and signs involving the circulatory and respiratory systems: Secondary | ICD-10-CM | POA: Diagnosis not present

## 2013-12-17 DIAGNOSIS — I495 Sick sinus syndrome: Secondary | ICD-10-CM | POA: Diagnosis not present

## 2013-12-17 DIAGNOSIS — I440 Atrioventricular block, first degree: Secondary | ICD-10-CM | POA: Diagnosis not present

## 2013-12-17 DIAGNOSIS — I4891 Unspecified atrial fibrillation: Secondary | ICD-10-CM | POA: Diagnosis not present

## 2013-12-17 DIAGNOSIS — R0609 Other forms of dyspnea: Secondary | ICD-10-CM | POA: Diagnosis not present

## 2013-12-17 LAB — MDC_IDC_ENUM_SESS_TYPE_INCLINIC
Battery Remaining Longevity: 144 mo
Brady Statistic RV Percent Paced: 0 %
Implantable Pulse Generator Serial Number: 112305
Lead Channel Impedance Value: 559 Ohm
Lead Channel Impedance Value: 619 Ohm
Lead Channel Setting Pacing Amplitude: 2 V
Lead Channel Setting Pacing Pulse Width: 0.4 ms
MDC IDC MSMT BATTERY REMAINING PERCENTAGE: 100 %
MDC IDC SESS DTM: 20150515172331
MDC IDC SET LEADCHNL RV PACING AMPLITUDE: 3.5 V
MDC IDC SET LEADCHNL RV SENSING SENSITIVITY: 2.5 mV
MDC IDC STAT BRADY RA PERCENT PACED: 100 %
Zone Setting Detection Interval: 375 ms

## 2013-12-17 NOTE — Progress Notes (Signed)
Patient Care Team: [name], MD as PCP - General (Family Medicine)   HPI  [name] is a 68 y.o. male  Seen in followup for atrial fibrillation for which the takes tikosyn initated may 13  and underwent pacemaker implantation 4/14 her chronotropic incompetence received in a [employer] device.  He is doing better in sinus rhythm.   Thromboembolic risk profile is notable for a CHADS-VASc score of 3. He is on Rivaroxaban    He continues to complain of some chest discomfort. He was seen today in conjunction with [employer].[name] undertook some testing and demonstrated that by attenuating his minute ventilation response he felt better. This was changed from 13--10 and with that resolved some of the chest discomfort and  tightness  Past Medical History  Diagnosis Date  . Hypertension   . Hyperlipidemia   . CAD (coronary artery disease)     Mild 2 vessel disease per cath in 2006; myoview neg 2011  . Normal nuclear stress test 2011  . Tobacco abuse   . PVC's (premature ventricular contractions)   . Atrial flutter     s/p ablation 2006  . AF (atrial fibrillation)     recent new occurrence 3/13>>Tikosyn  . Depression 11-05-11    On Cymbalta  . Right ventricular enlargement     Mild noted echo 3/13  . Chronic anticoagulation     on Xarelto - started 11/12/11  . Shortness of breath   . GERD (gastroesophageal reflux disease)   . 1St degree AV block   . Pacemaker 11/23/2012    Dr Caryl Comes    Past Surgical History  Procedure Laterality Date  . Inguinal hernia repair  01/28/2008    Recurrent right inguinal hernia Laparoscopic, preperitoneal repair of recurrent right inguinal hernia with mesh -- SURGEON:  Dr. Fanny Skates.  . Vasectomy    . Cardiac catheterization  11/1988, 12/07/2004    showing essentially normal left  ventricular function, moderate two-vessel disease (50% LAD after the third diagonal and a 60+% stenosis in the second obtuse marginal)          . Tee with cardioversion  2001, 2006    Invasive electrophysiology study, electroanatomical mapping,and radiofrequency catheter ablation. -- patient's arrhythmia was isthmus dependent flutter not withstanding the very long cycle length.  RF energy delivered across the cavotricuspid isthmus successfully interrupted conduction across the isthmus and eliminated the patient's sub-straight. PHYSICIAN:  Deboraha Sprang, M.D  . Foot surgery  1996    left-retained hardware  . Inguinal hernia repair  11/06/2011    Procedure: HERNIA REPAIR INGUINAL ADULT;  Surgeon: Adin Hector, MD;  Location: [facility];  Service: General;  Laterality: Left;  . Hernia repair    . Tee without cardioversion  12/31/2011    Procedure: TRANSESOPHAGEAL ECHOCARDIOGRAM (TEE);  Surgeon: Josue Hector, MD;  Location: [facility] ENDOSCOPY;  Service: Cardiovascular;  Laterality: N/A;  tba after for tikosyn  . Pacemaker insertion  11/23/2012    Dr Caryl Comes    Current Outpatient Prescriptions  Medication Sig Dispense Refill  . Omega-3 Fatty Acids (FISH OIL) 1000 MG CAPS Take 1,000 mg by mouth 2 (two) times daily.      [name] TIKOSYN 500 MCG capsule TAKE 1 CAPSULE EVERY 12 HOURS  180 capsule  0  . XARELTO 20 MG TABS tablet TAKE 1 TABLET DAILY  30 tablet  0   No current facility-administered medications for this visit.    Allergies  Allergen Reactions  . Horse-Derived Products Hives    Review of Systems negative except from HPI and PMH  Physical Exam BP 121/66  Pulse 62  Ht 6' (1.829 m)  Wt 228 lb (103.42 kg)  BMI 30.92 kg/m2 Well developed and well nourished in no acute distress HENT normal E scleral and icterus clear Neck Supple JVP flat; carotids brisk and full Clear to ausculation  Regular rate and rhythm, no murmurs gallops or rub Soft with active bowel sounds No clubbing cyanosis none Edema Alert and oriented, grossly normal motor and sensory function Skin Warm and Dry  ECG demonstrates atrial pacing at a rate of 60  with a AR interval of 480 ms  Assessment and  Plan  Sinus node dysfunction  Atrial  Fibrillation on tikosyn  Pacemaker  BSx  Hypertension  1AVB profound  Dyspnea on exertion  We have reprogrammed his device from AAI--DDIR given his progressive conduction system disease.  We have attenuating his rate response as noted above.  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  In the event that we continue his symptoms options would include device change out for MVP pacing, apparently [place] has a new device and does this, and possibly upgrade to CRT P.

## 2013-12-17 NOTE — Patient Instructions (Signed)
Return to see Dr.Klein in 3 months

## 2013-12-21 ENCOUNTER — Encounter: Payer: Self-pay | Admitting: Internal Medicine

## 2013-12-31 NOTE — Progress Notes (Signed)
This encounter was created in error - please disregard.

## 2014-01-17 ENCOUNTER — Encounter: Payer: Medicare Other | Admitting: Internal Medicine

## 2014-01-27 DIAGNOSIS — B351 Tinea unguium: Secondary | ICD-10-CM

## 2014-02-02 DIAGNOSIS — Z85828 Personal history of other malignant neoplasm of skin: Secondary | ICD-10-CM | POA: Diagnosis not present

## 2014-02-02 DIAGNOSIS — I781 Nevus, non-neoplastic: Secondary | ICD-10-CM | POA: Diagnosis not present

## 2014-02-02 DIAGNOSIS — L570 Actinic keratosis: Secondary | ICD-10-CM | POA: Diagnosis not present

## 2014-02-02 DIAGNOSIS — L821 Other seborrheic keratosis: Secondary | ICD-10-CM | POA: Diagnosis not present

## 2014-03-03 ENCOUNTER — Telehealth: Payer: Self-pay | Admitting: Internal Medicine

## 2014-03-03 NOTE — Telephone Encounter (Signed)
New message           Pt has questions about his transmission

## 2014-03-03 NOTE — Telephone Encounter (Signed)
LMOVM stating we received his remote. Gave my direct #

## 2014-03-24 ENCOUNTER — Ambulatory Visit (INDEPENDENT_AMBULATORY_CARE_PROVIDER_SITE_OTHER): Payer: Medicare Other | Admitting: Internal Medicine

## 2014-03-24 VITALS — BP 126/73 | HR 61 | Ht 72.0 in | Wt 228.0 lb

## 2014-03-24 DIAGNOSIS — Z950 Presence of cardiac pacemaker: Secondary | ICD-10-CM

## 2014-03-24 DIAGNOSIS — I480 Paroxysmal atrial fibrillation: Secondary | ICD-10-CM

## 2014-03-24 DIAGNOSIS — I4891 Unspecified atrial fibrillation: Secondary | ICD-10-CM

## 2014-03-24 DIAGNOSIS — I495 Sick sinus syndrome: Secondary | ICD-10-CM

## 2014-03-24 LAB — MDC_IDC_ENUM_SESS_TYPE_INCLINIC
Date Time Interrogation Session: 20150820082849
Implantable Pulse Generator Serial Number: 112305
Lead Channel Impedance Value: 547 Ohm
Lead Channel Pacing Threshold Amplitude: 0.4 V
Lead Channel Pacing Threshold Amplitude: 0.8 V
Lead Channel Pacing Threshold Pulse Width: 0.4 ms
Lead Channel Pacing Threshold Pulse Width: 0.4 ms
Lead Channel Setting Sensing Sensitivity: 2.5 mV
MDC IDC MSMT BATTERY REMAINING LONGEVITY: 138 mo
MDC IDC MSMT BATTERY REMAINING PERCENTAGE: 100 %
MDC IDC MSMT LEADCHNL RA IMPEDANCE VALUE: 613 Ohm
MDC IDC SET LEADCHNL RA PACING AMPLITUDE: 2 V
MDC IDC SET LEADCHNL RV PACING AMPLITUDE: 1.5 V
MDC IDC SET LEADCHNL RV PACING PULSEWIDTH: 0.4 ms
MDC IDC STAT BRADY RA PERCENT PACED: 69 %
MDC IDC STAT BRADY RV PERCENT PACED: 67 %
Zone Setting Detection Interval: 375 ms

## 2014-03-24 MED ORDER — FUROSEMIDE 20 MG PO TABS: 10.0000 mg | ORAL_TABLET | Freq: Every day | ORAL | Status: DC

## 2014-03-24 NOTE — Patient Instructions (Addendum)
Your physician has recommended you make the following change in your medication:  1) START Lasix 10 mg daily  Your physician recommends that you schedule a follow-up appointment in: 3 months with Dr. Caryl Comes.

## 2014-03-24 NOTE — Progress Notes (Signed)
Patient Care Team: [name], MD as PCP - General (Family Medicine)   HPI  [name] is a 68 y.o. male  Seen in followup for atrial fibrillation for which the takes tikosyn initated may 13  and underwent pacemaker implantation 4/14 for chronotropic incompetence receivingin a Product/process development scientist.  He is doing better in sinus rhythm.   Thromboembolic risk profile is notable for a CHADS-VASc score of 3. He is on Rivaroxaban   At his last visit we changed his slope 13--10 was some improvement. He continues to complain of intermittent dyspnea on exertion with palpitations.  He has swelling in his feet; he been eating out more often.  This finding dyspnea the challenged; particularly when it is humid outside this is worse. He also notes a dry cough. This has been going on for about a year. Past Medical History  Diagnosis Date  . Hypertension   . Hyperlipidemia   . CAD (coronary artery disease)     Mild 2 vessel disease per cath in 2006; myoview neg 2011  . Normal nuclear stress test 2011  . Tobacco abuse   . PVC's (premature ventricular contractions)   . Atrial flutter     s/p ablation 2006  . AF (atrial fibrillation)     recent new occurrence 3/13>>Tikosyn  . Depression 11-05-11    On Cymbalta  . Right ventricular enlargement     Mild noted echo 3/13  . Chronic anticoagulation     on Xarelto - started 11/12/11  . Shortness of breath   . GERD (gastroesophageal reflux disease)   . 1St degree AV block   . Pacemaker 11/23/2012    Dr Caryl Comes    Past Surgical History  Procedure Laterality Date  . Inguinal hernia repair  01/28/2008    Recurrent right inguinal hernia Laparoscopic, preperitoneal repair of recurrent right inguinal hernia with mesh -- SURGEON:  Dr. Fanny Skates.  . Vasectomy    . Cardiac catheterization  11/1988, 12/07/2004    showing essentially normal left  ventricular function, moderate two-vessel disease (50% LAD after the third diagonal and a  60+% stenosis in the second obtuse marginal)         . Tee with cardioversion  2001, 2006    Invasive electrophysiology study, electroanatomical mapping,and radiofrequency catheter ablation. -- patient's arrhythmia was isthmus dependent flutter not withstanding the very long cycle length.  RF energy delivered across the cavotricuspid isthmus successfully interrupted conduction across the isthmus and eliminated the patient's sub-straight. PHYSICIAN:  Deboraha Sprang, M.D  . Foot surgery  1996    left-retained hardware  . Inguinal hernia repair  11/06/2011    Procedure: HERNIA REPAIR INGUINAL ADULT;  Surgeon: Adin Hector, MD;  Location: [facility];  Service: General;  Laterality: Left;  . Hernia repair    . Tee without cardioversion  12/31/2011    Procedure: TRANSESOPHAGEAL ECHOCARDIOGRAM (TEE);  Surgeon: Josue Hector, MD;  Location: [facility] ENDOSCOPY;  Service: Cardiovascular;  Laterality: N/A;  tba after for tikosyn  . Pacemaker insertion  11/23/2012    Dr Caryl Comes    Current Outpatient Prescriptions  Medication Sig Dispense Refill  . Omega-3 Fatty Acids (FISH OIL) 1000 MG CAPS Take 1,000 mg by mouth 2 (two) times daily.      [name] TIKOSYN 500 MCG capsule TAKE 1 CAPSULE EVERY 12 HOURS  180 capsule  0  . XARELTO 20 MG TABS tablet TAKE 1 TABLET DAILY  30 tablet  0  No current facility-administered medications for this visit.    Allergies  Allergen Reactions  . Horse-Derived Products Hives    Review of Systems negative except from HPI and PMH  Physical Exam BP 126/73  Pulse 61  Ht 6' (1.829 m)  Wt 228 lb (103.42 kg)  BMI 30.92 kg/m2 Well developed and well nourished in no acute distress HENT normal E scleral and icterus clear Neck Supple JVP flat; carotids brisk and full Clear to ausculation  Regular rate and rhythm, no murmurs gallops or rub Soft with active bowel sounds No clubbing cyanosis  1-2+  Edema Alert and oriented, grossly normal motor and sensory function Skin Warm and  Dry  ECG demonstrates atrial pacing at a rate of 60 with a AV interval of 480 ms  Assessment and  Plan  Sinus node dysfunction  Atrial  Fibrillation on tikosyn  Pacemaker  BSx  Hypertension  1AVB profound  Dyspnea on exertion  Peripheral edema  Cough-dry  Be of further reprogrammed his device to shorten his AV delay forward--300 ms. We tried multiple AV intervals; reproducibly he couldn't tell what was below 250 or so.  We'll continue to keep an option the possibility of CRT upgrade.  As relates to his edema we will begin him on Lasix 10 mg to take as needed and encouraged her to decrease his sodium intake  I think that his chest pain is noncardiac  He has declined referral to pulmonary for his cough; we will follow up with his PCP in January.

## 2014-03-30 ENCOUNTER — Encounter: Payer: Self-pay | Admitting: Internal Medicine

## 2014-04-13 ENCOUNTER — Other Ambulatory Visit: Payer: Self-pay | Admitting: Cardiovascular Disease

## 2014-04-13 ENCOUNTER — Encounter: Payer: Self-pay | Admitting: Internal Medicine

## 2014-04-20 ENCOUNTER — Ambulatory Visit: Payer: Self-pay | Admitting: Podiatry

## 2014-04-21 DIAGNOSIS — Z85828 Personal history of other malignant neoplasm of skin: Secondary | ICD-10-CM | POA: Diagnosis not present

## 2014-04-21 DIAGNOSIS — L570 Actinic keratosis: Secondary | ICD-10-CM | POA: Diagnosis not present

## 2014-04-21 DIAGNOSIS — D239 Other benign neoplasm of skin, unspecified: Secondary | ICD-10-CM | POA: Diagnosis not present

## 2014-04-21 DIAGNOSIS — L821 Other seborrheic keratosis: Secondary | ICD-10-CM | POA: Diagnosis not present

## 2014-04-25 ENCOUNTER — Encounter: Payer: Self-pay | Admitting: Podiatry

## 2014-04-25 ENCOUNTER — Ambulatory Visit (INDEPENDENT_AMBULATORY_CARE_PROVIDER_SITE_OTHER): Payer: Medicare Other | Admitting: Podiatry

## 2014-04-25 VITALS — BP 132/67 | HR 64 | Resp 16

## 2014-04-25 DIAGNOSIS — L03039 Cellulitis of unspecified toe: Secondary | ICD-10-CM

## 2014-04-25 MED ORDER — CEPHALEXIN 500 MG PO CAPS: 500.0000 mg | ORAL_CAPSULE | Freq: Three times a day (TID) | ORAL | Status: DC

## 2014-04-25 NOTE — Patient Instructions (Signed)

## 2014-04-26 NOTE — Progress Notes (Signed)
Subjective:     Patient ID: [name], male   DOB: 1945-11-18, 68 y.o.   MRN: [id number]  HPI patient states that his right fourth nail bed has been painful for the last few weeks and red around the nailbed itself. He feels like it slightly loose and it has been discomforting and shoe gear   Review of Systems     Objective:   Physical Exam Neurovascular status unchanged with a damaged right fourth nailbed with localized erythema and no proximal edema erythema or drainage noted    Assessment:     Probable trauma to the fourth nailbed creating possible localized infective process and damaged nailbed    Plan:     H&P and education rendered. Today I now the fourth toe with 60 mg Xylocaine Marcaine mixture remove the fourth nailbed flushed out the bed and applied sterile dressing. Gave instructions on soaks and placed on cephalexin 500 mg 3 times a day and gave strict instructions if any proximal edema erythema or any systemic signs of infection should occur he is to let us know immediately

## 2014-05-04 DIAGNOSIS — F329 Major depressive disorder, single episode, unspecified: Secondary | ICD-10-CM | POA: Diagnosis not present

## 2014-05-25 ENCOUNTER — Ambulatory Visit: Payer: Medicare Other | Admitting: Podiatry

## 2014-06-01 DIAGNOSIS — F3341 Major depressive disorder, recurrent, in partial remission: Secondary | ICD-10-CM | POA: Diagnosis not present

## 2014-06-02 ENCOUNTER — Encounter: Payer: Self-pay | Admitting: Podiatry

## 2014-06-02 ENCOUNTER — Ambulatory Visit (INDEPENDENT_AMBULATORY_CARE_PROVIDER_SITE_OTHER): Payer: Medicare Other | Admitting: Podiatry

## 2014-06-02 VITALS — BP 107/70 | HR 74 | Resp 16

## 2014-06-02 DIAGNOSIS — L600 Ingrowing nail: Secondary | ICD-10-CM | POA: Diagnosis not present

## 2014-06-02 NOTE — Progress Notes (Signed)
Subjective:     Patient ID: [name], male   DOB: June 04, 1946, 68 y.o.   MRN: [id number]  HPI patient states him still having some problems with his right fourth nail and I just want to make sure it was okay and whether anything else needs to be done   Review of Systems     Objective:   Physical Exam Neurovascular status intact no change in health history with a roughened fourth nail bed right that is not showing any proximal edema erythema or drainage    Assessment:     Probable localized inflamed tissue with no indications currently of proximal infection    Plan:     Reviewed with him condition and at this time we could consider removing this nailbed permanently as it does not appear healthy. We'll hold off on doing that and if symptoms were to persist or if it starts to bother him really need to do a permanent type procedure

## 2014-06-15 ENCOUNTER — Other Ambulatory Visit: Payer: Self-pay | Admitting: Internal Medicine

## 2014-06-16 DIAGNOSIS — L570 Actinic keratosis: Secondary | ICD-10-CM | POA: Diagnosis not present

## 2014-06-23 ENCOUNTER — Ambulatory Visit (INDEPENDENT_AMBULATORY_CARE_PROVIDER_SITE_OTHER): Payer: Medicare Other | Admitting: Internal Medicine

## 2014-06-23 ENCOUNTER — Encounter: Payer: Self-pay | Admitting: Internal Medicine

## 2014-06-23 VITALS — BP 134/86 | HR 60 | Ht 72.0 in | Wt 227.5 lb

## 2014-06-23 DIAGNOSIS — Z950 Presence of cardiac pacemaker: Secondary | ICD-10-CM | POA: Diagnosis not present

## 2014-06-23 DIAGNOSIS — Z45018 Encounter for adjustment and management of other part of cardiac pacemaker: Secondary | ICD-10-CM | POA: Diagnosis not present

## 2014-06-23 DIAGNOSIS — R609 Edema, unspecified: Secondary | ICD-10-CM | POA: Diagnosis not present

## 2014-06-23 DIAGNOSIS — I480 Paroxysmal atrial fibrillation: Secondary | ICD-10-CM

## 2014-06-23 DIAGNOSIS — R600 Localized edema: Secondary | ICD-10-CM | POA: Diagnosis not present

## 2014-06-23 DIAGNOSIS — I4589 Other specified conduction disorders: Secondary | ICD-10-CM | POA: Diagnosis not present

## 2014-06-23 DIAGNOSIS — I495 Sick sinus syndrome: Secondary | ICD-10-CM | POA: Diagnosis not present

## 2014-06-23 DIAGNOSIS — I509 Heart failure, unspecified: Secondary | ICD-10-CM | POA: Diagnosis not present

## 2014-06-23 DIAGNOSIS — I4891 Unspecified atrial fibrillation: Secondary | ICD-10-CM

## 2014-06-23 DIAGNOSIS — I440 Atrioventricular block, first degree: Secondary | ICD-10-CM

## 2014-06-23 DIAGNOSIS — I10 Essential (primary) hypertension: Secondary | ICD-10-CM | POA: Diagnosis not present

## 2014-06-23 DIAGNOSIS — R0600 Dyspnea, unspecified: Secondary | ICD-10-CM | POA: Diagnosis not present

## 2014-06-23 DIAGNOSIS — I5030 Unspecified diastolic (congestive) heart failure: Secondary | ICD-10-CM

## 2014-06-23 LAB — MDC_IDC_ENUM_SESS_TYPE_INCLINIC
Brady Statistic RA Percent Paced: 65 %
Lead Channel Impedance Value: 557 Ohm
Lead Channel Pacing Threshold Pulse Width: 0.4 ms
Lead Channel Pacing Threshold Pulse Width: 0.4 ms
Lead Channel Sensing Intrinsic Amplitude: 4.2 mV
Lead Channel Sensing Intrinsic Amplitude: 5.2 mV
Lead Channel Setting Pacing Amplitude: 2 V
Lead Channel Setting Pacing Amplitude: 3.5 V
Lead Channel Setting Pacing Pulse Width: 0.4 ms
Lead Channel Setting Sensing Sensitivity: 2.5 mV
MDC IDC MSMT LEADCHNL RA IMPEDANCE VALUE: 588 Ohm
MDC IDC MSMT LEADCHNL RA PACING THRESHOLD AMPLITUDE: 0.4 V
MDC IDC MSMT LEADCHNL RV PACING THRESHOLD AMPLITUDE: 0.9 V
MDC IDC PG SERIAL: 112305
MDC IDC SESS DTM: 20151119050000
MDC IDC SET ZONE DETECTION INTERVAL: 375 ms
MDC IDC STAT BRADY RV PERCENT PACED: 72 %

## 2014-06-23 LAB — BASIC METABOLIC PANEL
BUN: 14 mg/dL (ref 6–23)
CALCIUM: 8.9 mg/dL (ref 8.4–10.5)
CO2: 28 mEq/L (ref 19–32)
Chloride: 102 mEq/L (ref 96–112)
Creatinine, Ser: 0.8 mg/dL (ref 0.4–1.5)
GFR: 97.29 mL/min (ref >60.00)
Glucose, Bld: 93 mg/dL (ref 70–99)
Potassium: 4.3 mEq/L (ref 3.5–5.1)
SODIUM: 138 meq/L (ref 135–145)

## 2014-06-23 LAB — MAGNESIUM: Magnesium: 2 mg/dL (ref 1.5–2.5)

## 2014-06-23 MED ORDER — FUROSEMIDE 20 MG PO TABS: 20.0000 mg | ORAL_TABLET | Freq: Every day | ORAL | Status: DC

## 2014-06-23 NOTE — Progress Notes (Signed)
Patient Care Team: [name], MD as PCP - General (Family Medicine)   HPI  [name] is a 68 y.o. male  Seen in followup for atrial fibrillation for which the takes tikosyn initated may 13  and underwent pacemaker implantation 4/14 for chronotropic incompetence receivingin a Product/process development scientist.  He is doing better in sinus rhythm.   He has noted diarrhea associated with dofetilide.  Thromboembolic risk profile is notable for a CHADS-VASc score of 3. He is on Rivaroxaban   At his last visit we changed his slope 13--10 was some improvement. He continues to complain of intermittent dyspnea on exertion with palpitations.  We have also struggled with the AV delay trying to balance intrinsic conduction versus ventricular pacing  He continues with peripheral edema. Low dose furosemide, i.e. 10 mg, has not had much of an impact.     Past Medical History  Diagnosis Date  . Hypertension   . Hyperlipidemia   . CAD (coronary artery disease)     Mild 2 vessel disease per cath in 2006; myoview neg 2011  . Normal nuclear stress test 2011  . Tobacco abuse   . PVC's (premature ventricular contractions)   . Atrial flutter     s/p ablation 2006  . AF (atrial fibrillation)     recent new occurrence 3/13>>Tikosyn  . Depression 11-05-11    On Cymbalta  . Right ventricular enlargement     Mild noted echo 3/13  . Chronic anticoagulation     on Xarelto - started 11/12/11  . Shortness of breath   . GERD (gastroesophageal reflux disease)   . 1St degree AV block   . Pacemaker 11/23/2012    Dr Caryl Comes    Past Surgical History  Procedure Laterality Date  . Inguinal hernia repair  01/28/2008    Recurrent right inguinal hernia Laparoscopic, preperitoneal repair of recurrent right inguinal hernia with mesh -- SURGEON:  Dr. Fanny Skates.  . Vasectomy    . Cardiac catheterization  11/1988, 12/07/2004    showing essentially normal left  ventricular function, moderate two-vessel  disease (50% LAD after the third diagonal and a 60+% stenosis in the second obtuse marginal)         . Tee with cardioversion  2001, 2006    Invasive electrophysiology study, electroanatomical mapping,and radiofrequency catheter ablation. -- patient's arrhythmia was isthmus dependent flutter not withstanding the very long cycle length.  RF energy delivered across the cavotricuspid isthmus successfully interrupted conduction across the isthmus and eliminated the patient's sub-straight. PHYSICIAN:  Deboraha Sprang, M.D  . Foot surgery  1996    left-retained hardware  . Inguinal hernia repair  11/06/2011    Procedure: HERNIA REPAIR INGUINAL ADULT;  Surgeon: Adin Hector, MD;  Location: [facility];  Service: General;  Laterality: Left;  . Hernia repair    . Tee without cardioversion  12/31/2011    Procedure: TRANSESOPHAGEAL ECHOCARDIOGRAM (TEE);  Surgeon: Josue Hector, MD;  Location: [facility] ENDOSCOPY;  Service: Cardiovascular;  Laterality: N/A;  tba after for tikosyn  . Pacemaker insertion  11/23/2012    Dr Caryl Comes    Current Outpatient Prescriptions  Medication Sig Dispense Refill  . dofetilide (TIKOSYN) 500 MCG capsule Take 500 mcg by mouth 2 (two) times daily.    . furosemide (LASIX) 20 MG tablet Take 0.5 tablets (10 mg total) by mouth daily. 45 tablet 1  . Omega-3 Fatty Acids (FISH OIL) 1000 MG CAPS Take 1,000 mg by mouth  2 (two) times daily.    . rivaroxaban (XARELTO) 20 MG TABS tablet Take 1 tablet (20 mg total) by mouth daily. 90 tablet 0  . Vortioxetine HBr (BRINTELLIX) 20 MG TABS Take 20 mg by mouth daily.     No current facility-administered medications for this visit.    Allergies  Allergen Reactions  . Horse-Derived Products Hives    Review of Systems negative except from HPI and PMH  Physical Exam BP 134/86 mmHg  Pulse 60  Ht 6' (1.829 m)  Wt 103.193 kg (227 lb 8 oz)  BMI 30.85 kg/m2 Well developed and well nourished in no acute distress HENT normal E scleral and icterus  clear Neck Supple JVP flat; carotids brisk and full Clear to ausculation  Regular rate and rhythm, no murmurs gallops or rub Soft with active bowel sounds No clubbing cyanosis  1-2+  Edema Alert and oriented, grossly normal motor and sensory function Skin Warm and Dry  ECG demonstrates atrial pacing at a rate of 60 with a AV interval of 480 ms  Assessment and  Plan  Sinus node dysfunction  Atrial  Fibrillation on tikosyn  Pacemaker  BSx  Hypertension  1AVB profound  Dyspnea on exertion  Peripheral edema   I suspect he has HFpEF.  We'll increase his furosemide from 10-20 mg a day. I have reviewed with him the issue of threshold affect.  He is in need of a metabolic profile today for the assessment of his dofetilide.  We discussed reprogrammed his device; we will leave it as it is right now.  Blood pressure is well controlled

## 2014-06-23 NOTE — Patient Instructions (Addendum)
Your physician has recommended you make the following change in your medication:  1) INCREASE Furosemide to 20 mg daily  Labs today: Magnesium, BMET  Remote monitoring is used to monitor your Pacemaker of ICD from home. This monitoring reduces the number of office visits required to check your device to one time per year. It allows [place] to keep an eye on the functioning of your device to ensure it is working properly. You are scheduled for a device check from home on 09/22/14. You may send your transmission at any time that day. If you have a wireless device, the transmission will be sent automatically. After your physician reviews your transmission, you will receive a postcard with your next transmission date.  Your physician wants you to follow-up in: 6 months with Dr. Caryl Comes.  You will receive a reminder letter in the mail two months in advance. If you don't receive a letter, please call our office to schedule the follow-up appointment.

## 2014-06-29 ENCOUNTER — Encounter: Payer: Self-pay | Admitting: Internal Medicine

## 2014-07-14 ENCOUNTER — Encounter (HOSPITAL_COMMUNITY): Payer: Self-pay | Admitting: Internal Medicine

## 2014-07-20 DIAGNOSIS — N529 Male erectile dysfunction, unspecified: Secondary | ICD-10-CM | POA: Diagnosis not present

## 2014-07-20 DIAGNOSIS — F324 Major depressive disorder, single episode, in partial remission: Secondary | ICD-10-CM | POA: Diagnosis not present

## 2014-08-03 ENCOUNTER — Other Ambulatory Visit: Payer: Self-pay | Admitting: *Deleted

## 2014-08-03 MED ORDER — FUROSEMIDE 20 MG PO TABS: 20.0000 mg | ORAL_TABLET | Freq: Every day | ORAL | Status: DC

## 2014-08-10 ENCOUNTER — Other Ambulatory Visit: Payer: Self-pay

## 2014-08-10 MED ORDER — FUROSEMIDE 20 MG PO TABS
20.0000 mg | ORAL_TABLET | Freq: Every day | ORAL | Status: DC
Start: 2014-08-10 — End: 2015-01-17

## 2014-08-16 DIAGNOSIS — D485 Neoplasm of uncertain behavior of skin: Secondary | ICD-10-CM | POA: Diagnosis not present

## 2014-08-16 DIAGNOSIS — L821 Other seborrheic keratosis: Secondary | ICD-10-CM | POA: Diagnosis not present

## 2014-08-16 DIAGNOSIS — L570 Actinic keratosis: Secondary | ICD-10-CM | POA: Diagnosis not present

## 2014-08-26 ENCOUNTER — Other Ambulatory Visit: Payer: Self-pay | Admitting: Internal Medicine

## 2014-09-05 ENCOUNTER — Other Ambulatory Visit: Payer: Self-pay | Admitting: Cardiovascular Disease

## 2014-09-08 DIAGNOSIS — H40013 Open angle with borderline findings, low risk, bilateral: Secondary | ICD-10-CM | POA: Diagnosis not present

## 2014-09-22 ENCOUNTER — Ambulatory Visit (INDEPENDENT_AMBULATORY_CARE_PROVIDER_SITE_OTHER): Payer: Medicare Other | Admitting: *Deleted

## 2014-09-22 DIAGNOSIS — Z23 Encounter for immunization: Secondary | ICD-10-CM | POA: Diagnosis not present

## 2014-09-22 DIAGNOSIS — I495 Sick sinus syndrome: Secondary | ICD-10-CM | POA: Diagnosis not present

## 2014-09-22 DIAGNOSIS — F324 Major depressive disorder, single episode, in partial remission: Secondary | ICD-10-CM | POA: Diagnosis not present

## 2014-09-22 DIAGNOSIS — Z1389 Encounter for screening for other disorder: Secondary | ICD-10-CM | POA: Diagnosis not present

## 2014-09-22 DIAGNOSIS — Z136 Encounter for screening for cardiovascular disorders: Secondary | ICD-10-CM | POA: Diagnosis not present

## 2014-09-22 DIAGNOSIS — Z0000 Encounter for general adult medical examination without abnormal findings: Secondary | ICD-10-CM | POA: Diagnosis not present

## 2014-09-22 DIAGNOSIS — Z125 Encounter for screening for malignant neoplasm of prostate: Secondary | ICD-10-CM | POA: Diagnosis not present

## 2014-09-22 DIAGNOSIS — R030 Elevated blood-pressure reading, without diagnosis of hypertension: Secondary | ICD-10-CM | POA: Diagnosis not present

## 2014-09-22 LAB — MDC_IDC_ENUM_SESS_TYPE_REMOTE
Battery Remaining Longevity: 138 mo
Battery Remaining Percentage: 100 %
Brady Statistic RA Percent Paced: 68 %
Date Time Interrogation Session: 20160218061700
Lead Channel Impedance Value: 575 Ohm
Lead Channel Impedance Value: 582 Ohm
Lead Channel Pacing Threshold Amplitude: 0.4 V
Lead Channel Pacing Threshold Amplitude: 1 V
Lead Channel Setting Pacing Amplitude: 1.3 V
Lead Channel Setting Pacing Pulse Width: 0.4 ms
Lead Channel Setting Sensing Sensitivity: 2.5 mV
MDC IDC MSMT LEADCHNL RA PACING THRESHOLD PULSEWIDTH: 0.4 ms
MDC IDC MSMT LEADCHNL RV PACING THRESHOLD PULSEWIDTH: 0.4 ms
MDC IDC PG SERIAL: 112305
MDC IDC SET LEADCHNL RA PACING AMPLITUDE: 2 V
MDC IDC SET ZONE DETECTION INTERVAL: 375 ms
MDC IDC STAT BRADY RV PERCENT PACED: 76 %

## 2014-09-22 NOTE — Progress Notes (Signed)
Remote pacemaker transmission.   

## 2014-09-23 ENCOUNTER — Other Ambulatory Visit: Payer: Self-pay | Admitting: Family Medicine

## 2014-09-23 ENCOUNTER — Other Ambulatory Visit: Payer: Self-pay | Admitting: Internal Medicine

## 2014-09-23 DIAGNOSIS — Z136 Encounter for screening for cardiovascular disorders: Secondary | ICD-10-CM

## 2014-09-28 ENCOUNTER — Other Ambulatory Visit: Payer: Self-pay | Admitting: Family Medicine

## 2014-09-28 ENCOUNTER — Ambulatory Visit
Admission: RE | Admit: 2014-09-28 | Discharge: 2014-09-28 | Disposition: A | Discharge status: Home / Self Care | Payer: Medicare Other | Source: Ambulatory Visit | Attending: Family Medicine | Admitting: Family Medicine

## 2014-09-28 DIAGNOSIS — Z136 Encounter for screening for cardiovascular disorders: Secondary | ICD-10-CM

## 2014-09-28 DIAGNOSIS — F17200 Nicotine dependence, unspecified, uncomplicated: Secondary | ICD-10-CM | POA: Diagnosis not present

## 2014-10-12 ENCOUNTER — Encounter: Payer: Self-pay | Admitting: *Deleted

## 2014-10-13 ENCOUNTER — Encounter: Payer: Self-pay | Admitting: Internal Medicine

## 2014-11-03 DIAGNOSIS — Z85828 Personal history of other malignant neoplasm of skin: Secondary | ICD-10-CM | POA: Diagnosis not present

## 2014-11-03 DIAGNOSIS — L570 Actinic keratosis: Secondary | ICD-10-CM | POA: Diagnosis not present

## 2014-11-03 DIAGNOSIS — D2262 Melanocytic nevi of left upper limb, including shoulder: Secondary | ICD-10-CM | POA: Diagnosis not present

## 2014-11-03 DIAGNOSIS — D485 Neoplasm of uncertain behavior of skin: Secondary | ICD-10-CM | POA: Diagnosis not present

## 2014-11-03 DIAGNOSIS — D2271 Melanocytic nevi of right lower limb, including hip: Secondary | ICD-10-CM | POA: Diagnosis not present

## 2014-11-03 DIAGNOSIS — Z808 Family history of malignant neoplasm of other organs or systems: Secondary | ICD-10-CM | POA: Diagnosis not present

## 2014-11-03 DIAGNOSIS — D225 Melanocytic nevi of trunk: Secondary | ICD-10-CM | POA: Diagnosis not present

## 2014-11-03 DIAGNOSIS — L111 Transient acantholytic dermatosis [Grover]: Secondary | ICD-10-CM | POA: Diagnosis not present

## 2014-11-03 DIAGNOSIS — L2084 Intrinsic (allergic) eczema: Secondary | ICD-10-CM | POA: Diagnosis not present

## 2014-11-03 DIAGNOSIS — L851 Acquired keratosis [keratoderma] palmaris et plantaris: Secondary | ICD-10-CM | POA: Diagnosis not present

## 2014-11-03 DIAGNOSIS — D0439 Carcinoma in situ of skin of other parts of face: Secondary | ICD-10-CM | POA: Diagnosis not present

## 2014-11-07 ENCOUNTER — Other Ambulatory Visit: Payer: Self-pay | Admitting: Internal Medicine

## 2014-11-16 DIAGNOSIS — L570 Actinic keratosis: Secondary | ICD-10-CM | POA: Diagnosis not present

## 2014-11-16 DIAGNOSIS — D485 Neoplasm of uncertain behavior of skin: Secondary | ICD-10-CM | POA: Diagnosis not present

## 2014-12-20 ENCOUNTER — Ambulatory Visit (INDEPENDENT_AMBULATORY_CARE_PROVIDER_SITE_OTHER): Payer: Medicare Other | Admitting: Internal Medicine

## 2014-12-20 ENCOUNTER — Encounter: Payer: Self-pay | Admitting: Internal Medicine

## 2014-12-20 VITALS — BP 130/68 | HR 60 | Ht 72.0 in | Wt 227.0 lb

## 2014-12-20 DIAGNOSIS — I480 Paroxysmal atrial fibrillation: Secondary | ICD-10-CM | POA: Diagnosis not present

## 2014-12-20 DIAGNOSIS — I495 Sick sinus syndrome: Secondary | ICD-10-CM | POA: Diagnosis not present

## 2014-12-20 DIAGNOSIS — Z45018 Encounter for adjustment and management of other part of cardiac pacemaker: Secondary | ICD-10-CM | POA: Diagnosis not present

## 2014-12-20 DIAGNOSIS — Z79899 Other long term (current) drug therapy: Secondary | ICD-10-CM

## 2014-12-20 DIAGNOSIS — I440 Atrioventricular block, first degree: Secondary | ICD-10-CM | POA: Diagnosis not present

## 2014-12-20 DIAGNOSIS — I4589 Other specified conduction disorders: Secondary | ICD-10-CM

## 2014-12-20 LAB — CUP PACEART INCLINIC DEVICE CHECK
Brady Statistic RA Percent Paced: 71 %
Brady Statistic RV Percent Paced: 77 %
Date Time Interrogation Session: 20160517164841
Lead Channel Pacing Threshold Amplitude: 0.5 V
Lead Channel Pacing Threshold Amplitude: 0.7 V
Lead Channel Pacing Threshold Pulse Width: 0.4 ms
Lead Channel Pacing Threshold Pulse Width: 0.4 ms
Lead Channel Sensing Intrinsic Amplitude: 8.6 mV
Lead Channel Setting Pacing Amplitude: 1.3 V
Lead Channel Setting Pacing Amplitude: 2 V
MDC IDC MSMT LEADCHNL RA IMPEDANCE VALUE: 628 Ohm
MDC IDC MSMT LEADCHNL RA SENSING INTR AMPL: 2.9 mV
MDC IDC MSMT LEADCHNL RV IMPEDANCE VALUE: 595 Ohm
MDC IDC SET LEADCHNL RV PACING PULSEWIDTH: 0.4 ms
MDC IDC SET LEADCHNL RV SENSING SENSITIVITY: 2.5 mV
Pulse Gen Serial Number: 112305
Zone Setting Detection Interval: 375 ms

## 2014-12-20 NOTE — Progress Notes (Signed)
Patient Care Team: [name], MD as PCP - General (Family Medicine)   HPI  [name] is a 69 y.o. male  Seen in followup for atrial fibrillation for which the takes tikosyn initated may 13  and underwent pacemaker implantation 4/14 for chronotropic incompetence receivingin a Product/process development scientist.  He is doing better in sinus rhythm.    He takes dofetilide; last laboratories reviewed 11/15 were normal  Thromboembolic risk profile is notable for a CHADS-VASc score of 3. He is on Rivaroxaban   At his last visit we changed his slope 13--10 with  some improvement.    We have also struggled with the AV delay trying to balance intrinsic conduction versus ventricular pacing  He continues with peripheral edema. Low dose furosemide, i.e. 10 mg, has not had much of an impact.  LV function normal by echo 2013&  myoview 2014  Past Medical History  Diagnosis Date  . Hypertension   . Hyperlipidemia   . CAD (coronary artery disease)     Mild 2 vessel disease per cath in 2006; myoview neg 2011  . Normal nuclear stress test 2011  . Tobacco abuse   . PVC's (premature ventricular contractions)   . Atrial flutter     s/p ablation 2006  . AF (atrial fibrillation)     recent new occurrence 3/13>>Tikosyn  . Depression 11-05-11    On Cymbalta  . Right ventricular enlargement     Mild noted echo 3/13  . Chronic anticoagulation     on Xarelto - started 11/12/11  . Shortness of breath   . GERD (gastroesophageal reflux disease)   . 1St degree AV block   . Pacemaker 11/23/2012    Dr Caryl Comes    Past Surgical History  Procedure Laterality Date  . Inguinal hernia repair  01/28/2008    Recurrent right inguinal hernia Laparoscopic, preperitoneal repair of recurrent right inguinal hernia with mesh -- SURGEON:  Dr. Fanny Skates.  . Vasectomy    . Cardiac catheterization  11/1988, 12/07/2004    showing essentially normal left  ventricular function, moderate two-vessel disease (50%  LAD after the third diagonal and a 60+% stenosis in the second obtuse marginal)         . Tee with cardioversion  2001, 2006    Invasive electrophysiology study, electroanatomical mapping,and radiofrequency catheter ablation. -- patient's arrhythmia was isthmus dependent flutter not withstanding the very long cycle length.  RF energy delivered across the cavotricuspid isthmus successfully interrupted conduction across the isthmus and eliminated the patient's sub-straight. PHYSICIAN:  Deboraha Sprang, M.D  . Foot surgery  1996    left-retained hardware  . Inguinal hernia repair  11/06/2011    Procedure: HERNIA REPAIR INGUINAL ADULT;  Surgeon: Adin Hector, MD;  Location: [facility];  Service: General;  Laterality: Left;  . Hernia repair    . Tee without cardioversion  12/31/2011    Procedure: TRANSESOPHAGEAL ECHOCARDIOGRAM (TEE);  Surgeon: Josue Hector, MD;  Location: [facility] ENDOSCOPY;  Service: Cardiovascular;  Laterality: N/A;  tba after for tikosyn  . Pacemaker insertion  11/23/2012    Dr Caryl Comes  . Cardioversion N/A 12/05/2011    Procedure: CARDIOVERSION;  Surgeon: Deboraha Sprang, MD;  Location: [facility] CATH LAB;  Service: Cardiovascular;  Laterality: N/A;  . Permanent pacemaker insertion N/A 11/23/2012    Procedure: PERMANENT PACEMAKER INSERTION;  Surgeon: Deboraha Sprang, MD;  Location: [facility] CATH LAB;  Service: Cardiovascular;  Laterality: N/A;  Current Outpatient Prescriptions  Medication Sig Dispense Refill  . furosemide (LASIX) 20 MG tablet Take 1 tablet (20 mg total) by mouth daily. 90 tablet 1  . Omega-3 Fatty Acids (FISH OIL) 1000 MG CAPS Take 1,000 mg by mouth 2 (two) times daily.    [name] TIKOSYN 500 MCG capsule TAKE 1 CAPSULE EVERY 12 HOURS 180 capsule 0  . Vortioxetine HBr (BRINTELLIX) 20 MG TABS Take 20 mg by mouth daily.    Alveda Reasons 20 MG TABS tablet TAKE 1 TABLET DAILY 90 tablet 0   No current facility-administered medications for this visit.    Allergies  Allergen Reactions  .  Horse-Derived Products Hives    Review of Systems negative except from HPI and PMH  Physical Exam There were no vitals taken for this visit. 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  We'll increase his furosemide to 40 alt with 20 mg a day X 10 days with targeted weight loss of 5-8 lbs     He is in need of a metabolic profile today for the assessment of his dofetilide.  We discussed the physiology of HFpEF   Blood pressure is well controlled

## 2014-12-20 NOTE — Patient Instructions (Signed)
Medication Instructions:  Your physician recommends that you continue on your current medications as directed. Please refer to the Current Medication list given to you today.  Labwork: BMET & Magnesium lab in 2 weeks  Testing/Procedures: None ordered  Follow-Up: Your physician wants you to follow-up in: 6 months with Dr. Caryl Comes. You will receive a reminder letter in the mail two months in advance. If you don't receive a letter, please call our office to schedule the follow-up appointment.   Thank you for choosing [facility]!!

## 2014-12-27 ENCOUNTER — Encounter: Payer: Self-pay | Admitting: Internal Medicine

## 2014-12-28 NOTE — Telephone Encounter (Signed)
Called patient.  Patient reports no weight loss but leg "puffiness" is somewhat decreased.  States that some days is seems no different. Instructed him, per Dr. Caryl Comes, that we will re-evaluate next week after his lab work and determine dosage of medication to send in for refill. Patient will let us know Tuesday how he is doing. Patient verbalized understanding and agreeable to plan.

## 2015-01-03 ENCOUNTER — Other Ambulatory Visit (INDEPENDENT_AMBULATORY_CARE_PROVIDER_SITE_OTHER): Payer: Medicare Other | Admitting: *Deleted

## 2015-01-03 DIAGNOSIS — Z79899 Other long term (current) drug therapy: Secondary | ICD-10-CM

## 2015-01-03 LAB — BASIC METABOLIC PANEL
BUN: 12 mg/dL (ref 6–23)
CHLORIDE: 102 meq/L (ref 96–112)
CO2: 31 mEq/L (ref 19–32)
Calcium: 8.9 mg/dL (ref 8.4–10.5)
Creatinine, Ser: 0.97 mg/dL (ref 0.40–1.50)
GFR: 81.15 mL/min (ref >60.00)
Glucose, Bld: 92 mg/dL (ref 70–99)
POTASSIUM: 3.9 meq/L (ref 3.5–5.1)
Sodium: 138 mEq/L (ref 135–145)

## 2015-01-03 LAB — MAGNESIUM: MAGNESIUM: 2.1 mg/dL (ref 1.5–2.5)

## 2015-01-03 NOTE — Addendum Note (Signed)
Addended by: Eulis Foster on: 01/03/2015 01:29 PM   Modules accepted: Orders

## 2015-01-12 ENCOUNTER — Telehealth: Payer: Self-pay | Admitting: *Deleted

## 2015-01-12 DIAGNOSIS — Z79899 Other long term (current) drug therapy: Secondary | ICD-10-CM

## 2015-01-12 MED ORDER — FUROSEMIDE 40 MG PO TABS
40.0000 mg | ORAL_TABLET | Freq: Two times a day (BID) | ORAL | Status: DC
Start: 2015-01-12 — End: 2015-01-18

## 2015-01-12 NOTE — Telephone Encounter (Signed)
Stanton Kidney, RN at 12/28/2014 12:49 PM     Status: Signed       Expand All Collapse All   Called patient. Patient reports no weight loss but leg "puffiness" is somewhat decreased. States that some days is seems no different. Instructed him, per Dr. Caryl Comes, that we will re-evaluate next week after his lab work and determine dosage of medication to send in for refill. Patient will let us know Tuesday how he is doing. Patient verbalized understanding and agreeable to plan.        Reviewed with Dr. Caryl Comes: Instructions - 40 mg BID x 5 days.  BMET in 2 weeks. (Patient states he does urinate ok on 40 mg). Patient will call me next week if no weight loss/improvement is edema. Rx sent to [facility] per request.

## 2015-01-17 ENCOUNTER — Other Ambulatory Visit: Payer: Self-pay | Admitting: Internal Medicine

## 2015-01-20 ENCOUNTER — Other Ambulatory Visit: Payer: Self-pay | Admitting: Internal Medicine

## 2015-01-26 ENCOUNTER — Other Ambulatory Visit: Payer: Medicare Other

## 2015-01-30 ENCOUNTER — Other Ambulatory Visit: Payer: Self-pay

## 2015-02-03 ENCOUNTER — Other Ambulatory Visit: Payer: Self-pay | Admitting: Internal Medicine

## 2015-03-21 ENCOUNTER — Ambulatory Visit (INDEPENDENT_AMBULATORY_CARE_PROVIDER_SITE_OTHER): Payer: Medicare Other | Admitting: *Deleted

## 2015-03-21 DIAGNOSIS — I495 Sick sinus syndrome: Secondary | ICD-10-CM | POA: Diagnosis not present

## 2015-03-21 NOTE — Progress Notes (Signed)
Remote pacemaker transmission.   

## 2015-03-27 LAB — CUP PACEART REMOTE DEVICE CHECK
Battery Remaining Longevity: 126 mo
Brady Statistic RA Percent Paced: 62 %
Lead Channel Impedance Value: 575 Ohm
Lead Channel Impedance Value: 609 Ohm
Lead Channel Sensing Intrinsic Amplitude: 3.4 mV
Lead Channel Sensing Intrinsic Amplitude: 7 mV
Lead Channel Setting Pacing Amplitude: 1.3 V
Lead Channel Setting Sensing Sensitivity: 2.5 mV
MDC IDC MSMT BATTERY REMAINING PERCENTAGE: 100 %
MDC IDC SESS DTM: 20160816054200
MDC IDC SET LEADCHNL RA PACING AMPLITUDE: 2 V
MDC IDC SET LEADCHNL RV PACING PULSEWIDTH: 0.4 ms
MDC IDC STAT BRADY RV PERCENT PACED: 78 %
Pulse Gen Serial Number: 112305
Zone Setting Detection Interval: 375 ms

## 2015-03-30 DIAGNOSIS — M79673 Pain in unspecified foot: Secondary | ICD-10-CM

## 2015-04-14 ENCOUNTER — Encounter: Payer: Self-pay | Admitting: Cardiology

## 2015-04-24 ENCOUNTER — Encounter: Payer: Self-pay | Admitting: Internal Medicine

## 2015-05-05 DIAGNOSIS — D2271 Melanocytic nevi of right lower limb, including hip: Secondary | ICD-10-CM | POA: Diagnosis not present

## 2015-05-05 DIAGNOSIS — L821 Other seborrheic keratosis: Secondary | ICD-10-CM | POA: Diagnosis not present

## 2015-05-05 DIAGNOSIS — Z85828 Personal history of other malignant neoplasm of skin: Secondary | ICD-10-CM | POA: Diagnosis not present

## 2015-05-05 DIAGNOSIS — D2262 Melanocytic nevi of left upper limb, including shoulder: Secondary | ICD-10-CM | POA: Diagnosis not present

## 2015-05-05 DIAGNOSIS — D225 Melanocytic nevi of trunk: Secondary | ICD-10-CM | POA: Diagnosis not present

## 2015-06-27 ENCOUNTER — Ambulatory Visit (INDEPENDENT_AMBULATORY_CARE_PROVIDER_SITE_OTHER): Payer: Medicare Other | Admitting: Internal Medicine

## 2015-06-27 ENCOUNTER — Encounter: Payer: Self-pay | Admitting: Internal Medicine

## 2015-06-27 VITALS — BP 139/86 | HR 60 | Ht 72.0 in | Wt 227.8 lb

## 2015-06-27 DIAGNOSIS — I2510 Atherosclerotic heart disease of native coronary artery without angina pectoris: Secondary | ICD-10-CM | POA: Diagnosis not present

## 2015-06-27 DIAGNOSIS — I4891 Unspecified atrial fibrillation: Secondary | ICD-10-CM

## 2015-06-27 DIAGNOSIS — I10 Essential (primary) hypertension: Secondary | ICD-10-CM

## 2015-06-27 DIAGNOSIS — R0602 Shortness of breath: Secondary | ICD-10-CM

## 2015-06-27 DIAGNOSIS — Z950 Presence of cardiac pacemaker: Secondary | ICD-10-CM | POA: Diagnosis not present

## 2015-06-27 DIAGNOSIS — I4589 Other specified conduction disorders: Secondary | ICD-10-CM | POA: Diagnosis not present

## 2015-06-27 MED ORDER — FUROSEMIDE 40 MG PO TABS: 40.0000 mg | ORAL_TABLET | Freq: Every day | ORAL | Status: DC

## 2015-06-27 NOTE — Progress Notes (Signed)
Patient Care Team: [name], MD as PCP - General (Family Medicine)   HPI  [name] is a 69 y.o. 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  He is doing better in sinus rhythm.    He takes dofetilide; last laboratories reviewed 11/15 were normal  Thromboembolic risk profile is notable for a CHADS-VASc score of 3. He is on Rivaroxaban   At his last visit we changed his slope 13--10 with  some improvement.    We have also struggled with the AV delay trying to balance intrinsic conduction versus ventricular pacing  He continues with peripheral edema. Low dose furosemide, i.e. 10 mg, has not had much of an impact.  LV function normal by echo 2013&  myoview 2014  Past Medical History  Diagnosis Date  . Hypertension   . Hyperlipidemia   . CAD (coronary artery disease)     Mild 2 vessel disease per cath in 2006; myoview neg 2011  . Normal nuclear stress test 2011  . Tobacco abuse   . PVC's (premature ventricular contractions)   . Atrial flutter ([place])     s/p ablation 2006  . AF (atrial fibrillation) ([place])     recent new occurrence 3/13>>Tikosyn  . Depression 11-05-11    On Cymbalta  . Right ventricular enlargement     Mild noted echo 3/13  . Chronic anticoagulation     on Xarelto - started 11/12/11  . Shortness of breath   . GERD (gastroesophageal reflux disease)   . 1St degree AV block   . Pacemaker 11/23/2012    Dr Caryl Comes    Past Surgical History  Procedure Laterality Date  . Inguinal hernia repair  01/28/2008    Recurrent right inguinal hernia Laparoscopic, preperitoneal repair of recurrent right inguinal hernia with mesh -- SURGEON:  Dr. Fanny Skates.  . Vasectomy    . Cardiac catheterization  11/1988, 12/07/2004    showing essentially normal left  ventricular function, moderate two-vessel  disease (50% LAD after the third diagonal and a 60+% stenosis in the second obtuse marginal)         . Tee with cardioversion  2001, 2006    Invasive electrophysiology study, electroanatomical mapping,and radiofrequency catheter ablation. -- patient's arrhythmia was isthmus dependent flutter not withstanding the very long cycle length.  RF energy delivered across the cavotricuspid isthmus successfully interrupted conduction across the isthmus and eliminated the patient's sub-straight. PHYSICIAN:  Deboraha Sprang, M.D  . Foot surgery  1996    left-retained hardware  . Inguinal hernia repair  11/06/2011    Procedure: HERNIA REPAIR INGUINAL ADULT;  Surgeon: Adin Hector, MD;  Location: [facility] ORS;  Service: General;  Laterality: Left;  . Hernia repair    . Tee without cardioversion  12/31/2011    Procedure: TRANSESOPHAGEAL ECHOCARDIOGRAM (TEE);  Surgeon: Josue Hector, MD;  Location: [facility] ENDOSCOPY;  Service: Cardiovascular;  Laterality: N/A;  tba after for tikosyn  . Pacemaker insertion  11/23/2012    Dr Caryl Comes  . Cardioversion N/A 12/05/2011    Procedure: CARDIOVERSION;  Surgeon: Deboraha Sprang, MD;  Location: [facility] CATH LAB;  Service: Cardiovascular;  Laterality: N/A;  . Permanent pacemaker insertion N/A 11/23/2012    Procedure: PERMANENT PACEMAKER INSERTION;  Surgeon: Deboraha Sprang, MD;  Location: [facility] CATH LAB;  Service: Cardiovascular;  Laterality: N/A;  Current Outpatient Prescriptions  Medication Sig Dispense Refill  . furosemide (LASIX) 20 MG tablet TAKE 1 TABLET DAILY 90 tablet 1  . Omega-3 Fatty Acids (FISH OIL) 1000 MG CAPS Take 1,000 mg by mouth 2 (two) times daily.    [name] TIKOSYN 500 MCG capsule TAKE 1 CAPSULE EVERY 12 HOURS 180 capsule 3  . XARELTO 20 MG TABS tablet TAKE 1 TABLET DAILY 90 tablet 0   No current facility-administered medications for this visit.    Allergies  Allergen Reactions  . Horse-Derived Products Hives    Review of Systems negative except from HPI and  PMH  Physical Exam BP 139/86 mmHg  Pulse 60  Ht 6' (1.829 m)  Wt 227 lb 12.8 oz (103.329 kg)  BMI 30.89 kg/m2 Well developed and well nourished in no acute distress HENT normal E scleral and icterus clear Neck Supple JVP flat; carotids brisk and full Clear to ausculation  Regular rate and rhythm, no murmurs gallops or rub Soft with active bowel sounds No clubbing cyanosis  1-2+  Edema Alert and oriented, grossly normal motor and sensory function Skin Warm and Dry  ECG demonstrates atrial pacing at a rate of 60 with a AV interval of 480 ms QRS is about 180  Assessment and  Plan  Sinus node dysfunction  Atrial  Fibrillation on tikosyn  Pacemaker  BSx  Hypertension  1AVB profound/ IVCD  HfpEF     We will push his diuretics to 80 to promote brisk urination   He is still is volume overlaoaded  We will also arrange for a stress test to see how we might optimize the programming of his device  The optimal pacing out of rhythm with his quality left bundle prolonged PR interval is not at all clear empirically. We will wait to see what happens to his PR interval with exercise

## 2015-06-27 NOTE — Patient Instructions (Signed)
Medication Instructions: 1) Start lasix (furosemide) 40 mg one tablet by mouth once daily  Labwork: - none  Procedures/Testing: -Your physician has requested that you have an exercise tolerance test. For further information please visit [email or web address]. Please also follow instruction sheet, as given.  Follow-Up: - Remote monitoring is used to monitor your Pacemaker of ICD from home. This monitoring reduces the number of office visits required to check your device to one time per year. It allows [place] to keep an eye on the functioning of your device to ensure it is working properly. You are scheduled for a device check from home on 09/26/15. You may send your transmission at any time that day. If you have a wireless device, the transmission will be sent automatically. After your physician reviews your transmission, you will receive a postcard with your next transmission date.  - Your physician wants you to follow-up in: 6 months with Dr. Caryl Comes. You will receive a reminder letter in the mail two months in advance. If you don't receive a letter, please call our office to schedule the follow-up appointment.  Any Additional Special Instructions Will Be Listed Below (If Applicable). - none

## 2015-06-28 DIAGNOSIS — D225 Melanocytic nevi of trunk: Secondary | ICD-10-CM | POA: Diagnosis not present

## 2015-06-28 DIAGNOSIS — L821 Other seborrheic keratosis: Secondary | ICD-10-CM | POA: Diagnosis not present

## 2015-06-28 DIAGNOSIS — D1800 Hemangioma unspecified site: Secondary | ICD-10-CM | POA: Diagnosis not present

## 2015-06-28 DIAGNOSIS — D485 Neoplasm of uncertain behavior of skin: Secondary | ICD-10-CM | POA: Diagnosis not present

## 2015-06-28 DIAGNOSIS — L111 Transient acantholytic dermatosis [Grover]: Secondary | ICD-10-CM | POA: Diagnosis not present

## 2015-06-28 DIAGNOSIS — Z23 Encounter for immunization: Secondary | ICD-10-CM | POA: Diagnosis not present

## 2015-07-01 LAB — CUP PACEART INCLINIC DEVICE CHECK
Brady Statistic RA Percent Paced: 56 %
Brady Statistic RV Percent Paced: 78 %
Implantable Lead Implant Date: 20140421
Implantable Lead Implant Date: 20140421
Implantable Lead Location: 753860
Implantable Lead Model: 5076
Lead Channel Impedance Value: 643 Ohm
Lead Channel Pacing Threshold Amplitude: 0.6 V
Lead Channel Pacing Threshold Amplitude: 0.6 V
Lead Channel Pacing Threshold Pulse Width: 0.4 ms
Lead Channel Sensing Intrinsic Amplitude: 3.5 mV
Lead Channel Sensing Intrinsic Amplitude: 8.3 mV
Lead Channel Setting Pacing Amplitude: 2 V
Lead Channel Setting Sensing Sensitivity: 2.5 mV
MDC IDC LEAD LOCATION: 753859
MDC IDC MSMT LEADCHNL RA IMPEDANCE VALUE: 604 Ohm
MDC IDC MSMT LEADCHNL RV PACING THRESHOLD PULSEWIDTH: 0.4 ms
MDC IDC SESS DTM: 20161122050000
MDC IDC SET LEADCHNL RV PACING AMPLITUDE: 1.2 V
MDC IDC SET LEADCHNL RV PACING PULSEWIDTH: 0.4 ms
Pulse Gen Serial Number: 112305

## 2015-07-03 DIAGNOSIS — D1801 Hemangioma of skin and subcutaneous tissue: Secondary | ICD-10-CM | POA: Diagnosis not present

## 2015-07-05 ENCOUNTER — Other Ambulatory Visit: Payer: Self-pay | Admitting: Cardiovascular Disease

## 2015-08-14 ENCOUNTER — Encounter: Payer: Medicare Other | Admitting: Internal Medicine

## 2015-08-17 DIAGNOSIS — H524 Presbyopia: Secondary | ICD-10-CM | POA: Diagnosis not present

## 2015-08-17 DIAGNOSIS — H25813 Combined forms of age-related cataract, bilateral: Secondary | ICD-10-CM | POA: Diagnosis not present

## 2015-08-21 ENCOUNTER — Encounter: Payer: Medicare Other | Admitting: Internal Medicine

## 2015-08-21 ENCOUNTER — Ambulatory Visit (INDEPENDENT_AMBULATORY_CARE_PROVIDER_SITE_OTHER): Payer: Medicare Other

## 2015-08-21 ENCOUNTER — Encounter: Payer: Self-pay | Admitting: Internal Medicine

## 2015-08-21 DIAGNOSIS — R0602 Shortness of breath: Secondary | ICD-10-CM | POA: Diagnosis not present

## 2015-08-25 ENCOUNTER — Encounter: Payer: Self-pay | Admitting: Internal Medicine

## 2015-08-25 LAB — EXERCISE TOLERANCE TEST
MPHR: 149 {beats}/min
Rest HR: 60 {beats}/min

## 2015-08-26 ENCOUNTER — Encounter: Payer: Self-pay | Admitting: Internal Medicine

## 2015-08-31 ENCOUNTER — Other Ambulatory Visit: Payer: Self-pay | Admitting: *Deleted

## 2015-08-31 DIAGNOSIS — I480 Paroxysmal atrial fibrillation: Secondary | ICD-10-CM

## 2015-08-31 MED ORDER — FUROSEMIDE 40 MG PO TABS: ORAL_TABLET | ORAL | Status: DC

## 2015-09-07 ENCOUNTER — Other Ambulatory Visit (INDEPENDENT_AMBULATORY_CARE_PROVIDER_SITE_OTHER): Payer: Medicare Other | Admitting: *Deleted

## 2015-09-07 DIAGNOSIS — I480 Paroxysmal atrial fibrillation: Secondary | ICD-10-CM

## 2015-09-07 DIAGNOSIS — I4891 Unspecified atrial fibrillation: Secondary | ICD-10-CM | POA: Diagnosis not present

## 2015-09-07 LAB — BASIC METABOLIC PANEL
BUN: 19 mg/dL (ref 7–25)
CO2: 26 mmol/L (ref 20–31)
Calcium: 8.8 mg/dL (ref 8.6–10.3)
Chloride: 101 mmol/L (ref 98–110)
Creat: 1.08 mg/dL (ref 0.70–1.18)
GLUCOSE: 131 mg/dL — AB (ref 65–99)
POTASSIUM: 3.7 mmol/L (ref 3.5–5.3)
SODIUM: 140 mmol/L (ref 135–146)

## 2015-09-07 LAB — MAGNESIUM: Magnesium: 2.2 mg/dL (ref 1.5–2.5)

## 2015-09-18 ENCOUNTER — Encounter: Payer: Self-pay | Admitting: *Deleted

## 2015-09-25 ENCOUNTER — Other Ambulatory Visit: Payer: Self-pay | Admitting: Family Medicine

## 2015-09-25 ENCOUNTER — Ambulatory Visit
Admission: RE | Admit: 2015-09-25 | Discharge: 2015-09-25 | Disposition: A | Discharge status: Home / Self Care | Payer: Medicare Other | Source: Ambulatory Visit | Attending: Family Medicine | Admitting: Family Medicine

## 2015-09-25 DIAGNOSIS — Z0000 Encounter for general adult medical examination without abnormal findings: Secondary | ICD-10-CM | POA: Diagnosis not present

## 2015-09-25 DIAGNOSIS — N529 Male erectile dysfunction, unspecified: Secondary | ICD-10-CM | POA: Diagnosis not present

## 2015-09-25 DIAGNOSIS — M546 Pain in thoracic spine: Secondary | ICD-10-CM | POA: Diagnosis not present

## 2015-09-25 DIAGNOSIS — Z1211 Encounter for screening for malignant neoplasm of colon: Secondary | ICD-10-CM | POA: Diagnosis not present

## 2015-09-25 DIAGNOSIS — I4891 Unspecified atrial fibrillation: Secondary | ICD-10-CM | POA: Diagnosis not present

## 2015-09-25 DIAGNOSIS — F324 Major depressive disorder, single episode, in partial remission: Secondary | ICD-10-CM | POA: Diagnosis not present

## 2015-09-25 DIAGNOSIS — Z125 Encounter for screening for malignant neoplasm of prostate: Secondary | ICD-10-CM | POA: Diagnosis not present

## 2015-09-25 DIAGNOSIS — R1011 Right upper quadrant pain: Secondary | ICD-10-CM | POA: Diagnosis not present

## 2015-09-25 DIAGNOSIS — M47814 Spondylosis without myelopathy or radiculopathy, thoracic region: Secondary | ICD-10-CM | POA: Diagnosis not present

## 2015-09-26 ENCOUNTER — Ambulatory Visit (INDEPENDENT_AMBULATORY_CARE_PROVIDER_SITE_OTHER): Payer: Medicare Other | Admitting: *Deleted

## 2015-09-26 ENCOUNTER — Other Ambulatory Visit: Payer: Self-pay | Admitting: Family Medicine

## 2015-09-26 DIAGNOSIS — I495 Sick sinus syndrome: Secondary | ICD-10-CM | POA: Diagnosis not present

## 2015-09-26 DIAGNOSIS — R1011 Right upper quadrant pain: Secondary | ICD-10-CM

## 2015-09-27 NOTE — Progress Notes (Signed)
Remote pacemaker transmission.   

## 2015-10-04 ENCOUNTER — Ambulatory Visit (INDEPENDENT_AMBULATORY_CARE_PROVIDER_SITE_OTHER): Payer: Medicare Other | Admitting: *Deleted

## 2015-10-04 ENCOUNTER — Ambulatory Visit
Admission: RE | Admit: 2015-10-04 | Discharge: 2015-10-04 | Disposition: A | Discharge status: Home / Self Care | Payer: Medicare Other | Source: Ambulatory Visit | Attending: Family Medicine | Admitting: Family Medicine

## 2015-10-04 ENCOUNTER — Encounter: Payer: Self-pay | Admitting: Cardiology

## 2015-10-04 ENCOUNTER — Encounter: Payer: Self-pay | Admitting: Internal Medicine

## 2015-10-04 DIAGNOSIS — Z79899 Other long term (current) drug therapy: Secondary | ICD-10-CM

## 2015-10-04 DIAGNOSIS — R1011 Right upper quadrant pain: Secondary | ICD-10-CM

## 2015-10-04 DIAGNOSIS — K7689 Other specified diseases of liver: Secondary | ICD-10-CM | POA: Diagnosis not present

## 2015-10-04 MED ORDER — DOFETILIDE 500 MCG PO CAPS: ORAL_CAPSULE | ORAL | Status: DC

## 2015-10-04 NOTE — Progress Notes (Signed)
Dr. Noland Fordyce office calls and informs me they have called in a different abx - Septra. Discussed this with Gay Filler, pharmacist and informed [name]'s office that that medication is even worse.  Explained that per our pharmacist it would be better for pt to be on Cipro over Septra (as these are the only 2 drugs used to treat suspected Prostatitis). Dr. Noland Fordyce office will call patient and inform him of staying on Cipro. We will continue with EKG on Monday to make sure QTC is maintaining an acceptable length.

## 2015-10-04 NOTE — Progress Notes (Signed)
Received fax from [facility] showing patient on Tikosyn and recently prescribed Ciprofloxacin. Called patient who states he has been taking abx since Friday 2/24. According to patient abx started by Dr. Alyson Ingles secondary to a high PSA. Contact Dr. Noland Fordyce office, informed them of concern and asked they switch patient to another abx.  Asked that they call me once reviewed with physician. Asked patient to come by office for EKG today.  QTC today is 468 - reviewed with Dr. Angelena Form, DOD.  Advised patient to stop taking Cipro. Advised to go to call us or go to ED is he develops CP. Patient is to return Monday for follow up EKG.

## 2015-10-04 NOTE — Patient Instructions (Addendum)
Medication Instructions:  Your physician recommends that you continue on your current medications as directed. Please refer to the Current Medication list given to you today.  Sent refill into [facility] for Tikosyn per your request.  Do not take anymore Ciprofloxacin.  Dr. Noland Fordyce office has been contacted about switching antibiotics.  Labwork: None ordered  Testing/Procedures: None ordered  Follow-Up: Your physician recommends that you schedule a follow-up appointment on Monday 3/6 for EKG, arrive at 9:00 a.m.   Any Other Special Instructions Will Be Listed Below (If Applicable). Please call or go to the emergency room if you develop chest pain.

## 2015-10-09 NOTE — Addendum Note (Signed)
Addended by: Freada Bergeron on: 10/09/2015 05:41 PM   Modules accepted: Orders

## 2015-10-18 DIAGNOSIS — R972 Elevated prostate specific antigen [PSA]: Secondary | ICD-10-CM | POA: Diagnosis not present

## 2015-10-20 ENCOUNTER — Encounter: Payer: Self-pay | Admitting: Cardiology

## 2015-10-20 LAB — CUP PACEART REMOTE DEVICE CHECK
Battery Remaining Longevity: 120 mo
Brady Statistic RV Percent Paced: 77 %
Date Time Interrogation Session: 20170221060500
Implantable Lead Implant Date: 20140421
Implantable Lead Location: 753859
Implantable Lead Model: 5076
Lead Channel Setting Pacing Amplitude: 1.3 V
Lead Channel Setting Pacing Amplitude: 2 V
Lead Channel Setting Pacing Pulse Width: 0.4 ms
MDC IDC LEAD IMPLANT DT: 20140421
MDC IDC LEAD LOCATION: 753860
MDC IDC MSMT BATTERY REMAINING PERCENTAGE: 100 %
MDC IDC MSMT LEADCHNL RA IMPEDANCE VALUE: 580 Ohm
MDC IDC MSMT LEADCHNL RA SENSING INTR AMPL: 4.7 mV
MDC IDC MSMT LEADCHNL RV IMPEDANCE VALUE: 616 Ohm
MDC IDC MSMT LEADCHNL RV SENSING INTR AMPL: 19.4 mV
MDC IDC PG SERIAL: 112305
MDC IDC SET LEADCHNL RV SENSING SENSITIVITY: 2.5 mV
MDC IDC STAT BRADY RA PERCENT PACED: 21 %

## 2015-10-25 DIAGNOSIS — Z1211 Encounter for screening for malignant neoplasm of colon: Secondary | ICD-10-CM | POA: Diagnosis not present

## 2016-01-05 ENCOUNTER — Encounter: Payer: Medicare Other | Admitting: Internal Medicine

## 2016-04-02 ENCOUNTER — Other Ambulatory Visit: Payer: Self-pay | Admitting: Internal Medicine

## 2016-04-03 ENCOUNTER — Encounter: Payer: Self-pay | Admitting: Internal Medicine

## 2016-04-03 ENCOUNTER — Ambulatory Visit (INDEPENDENT_AMBULATORY_CARE_PROVIDER_SITE_OTHER): Payer: Medicare Other | Admitting: Internal Medicine

## 2016-04-03 VITALS — BP 140/80 | HR 71 | Ht 72.0 in | Wt 218.0 lb

## 2016-04-03 DIAGNOSIS — I4891 Unspecified atrial fibrillation: Secondary | ICD-10-CM | POA: Diagnosis not present

## 2016-04-03 MED ORDER — METOLAZONE 2.5 MG PO TABS: ORAL_TABLET | ORAL | 1 refills | Status: DC

## 2016-04-03 NOTE — Progress Notes (Signed)
Patient Care Team: [name], MD as PCP - General (Family Medicine)   HPI  [name] is a 70 y.o. 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  He is doing better in sinus rhythm.    He takes dofetilide; last laboratories reviewed 11/15 were normal  Thromboembolic risk profile is notable for a CHADS-VASc score of 3. He is on Rivaroxaban   His edema and his dyspnea were improved with up titration of his diuretics from Lasix 40--80.   2/17  K3.7 Cr 1.08       LV function normal by echo 2013&  myoview 2014  Past Medical History:  Diagnosis Date  . 1St degree AV block   . AF (atrial fibrillation) ([place])    recent new occurrence 3/13>>Tikosyn  . Atrial flutter ([place])    s/p ablation 2006  . CAD (coronary artery disease)    Mild 2 vessel disease per cath in 2006; myoview neg 2011  . Chronic anticoagulation    on Xarelto - started 11/12/11  . Depression 11-05-11   On Cymbalta  . GERD (gastroesophageal reflux disease)   . Hyperlipidemia   . Hypertension   . Normal nuclear stress test 2011  . Pacemaker 11/23/2012   Dr Caryl Comes  . PVC's (premature ventricular contractions)   . Right ventricular enlargement    Mild noted echo 3/13  . Shortness of breath   . Tobacco abuse     Past Surgical History:  Procedure Laterality Date  . CARDIAC CATHETERIZATION  11/1988, 12/07/2004   showing essentially normal left  ventricular function, moderate two-vessel disease (50% LAD after the third diagonal and a 60+% stenosis in the second obtuse marginal)         . CARDIOVERSION N/A 12/05/2011   Procedure: CARDIOVERSION;  Surgeon: Deboraha Sprang, MD;  Location: [facility] CATH LAB;  Service: Cardiovascular;  Laterality: N/A;  . FOOT SURGERY  1996   left-retained hardware  . HERNIA REPAIR    . INGUINAL HERNIA REPAIR  01/28/2008   Recurrent right inguinal hernia Laparoscopic, preperitoneal repair of recurrent right inguinal hernia with mesh -- SURGEON:  Dr. Fanny Skates.  . INGUINAL HERNIA REPAIR  11/06/2011   Procedure: HERNIA REPAIR INGUINAL ADULT;  Surgeon: Adin Hector, MD;  Location: [facility] ORS;  Service: General;  Laterality: Left;  . PACEMAKER INSERTION  11/23/2012   Dr Caryl Comes  . PERMANENT PACEMAKER INSERTION N/A 11/23/2012   Procedure: PERMANENT PACEMAKER INSERTION;  Surgeon: Deboraha Sprang, MD;  Location: [facility] CATH LAB;  Service: Cardiovascular;  Laterality: N/A;  . TEE WITH CARDIOVERSION  2001, 2006   Invasive electrophysiology study, electroanatomical mapping,and radiofrequency catheter ablation. -- patient's arrhythmia was isthmus dependent flutter not withstanding the very long cycle length.  RF energy delivered across the cavotricuspid isthmus successfully interrupted conduction across the isthmus and eliminated the patient's sub-straight. PHYSICIAN:  Deboraha Sprang, M.D  . TEE WITHOUT CARDIOVERSION  12/31/2011   Procedure: TRANSESOPHAGEAL ECHOCARDIOGRAM (TEE);  Surgeon: Josue Hector, MD;  Location: [facility] ENDOSCOPY;  Service: Cardiovascular;  Laterality: N/A;  tba after for tikosyn  . VASECTOMY      Current Outpatient Prescriptions  Medication Sig Dispense Refill  . dofetilide (TIKOSYN) 500 MCG capsule TAKE 1 CAPSULE EVERY 12 HOURS 180 capsule 2  . furosemide (LASIX) 40 MG tablet Take two tablets (80 mg) by  mouth once daily as directed 180 tablet 3  . Omega-3 Fatty Acids (FISH OIL) 1000 MG CAPS Take 1,000 mg by mouth 2 (two) times daily.    . rivaroxaban (XARELTO) 20 MG TABS tablet Take 1 tablet (20 mg total) by mouth daily. 90 tablet 0  . VIAGRA 100 MG tablet Take 1 tablet by mouth as needed for erectile dysfunction.     No current facility-administered medications for this visit.     Allergies  Allergen Reactions  . Horse-Derived Products Hives    Review of Systems negative except from HPI and  PMH  Physical Exam BP 140/80   Pulse 71   Ht 6' (1.829 m)   Wt 218 lb (98.9 kg)   SpO2 99%   BMI 29.57 kg/m  Well developed and well nourished in no acute distress HENT normal E scleral and icterus clear Neck Supple JVP flat; carotids brisk and full Clear to ausculation  Regular rate and rhythm, no murmurs gallops or rub Soft with active bowel sounds No clubbing cyanosis  1-2+  Edema Alert and oriented, grossly normal motor and sensory function Skin Warm and Dry  ECG demonstrates atrial pacing at a rate of 60 with a AV interval of 480 ms QRS is about 180  Assessment and  Plan  Sinus node dysfunction  Atrial  Fibrillation on tikosyn  Pacemaker  BSx  Hypertension  1AVB profound/ IVCD  HfpEF   Device demonstrates significant interval increase in atrial fibrillation [place] about 16 or 17%-to-40%  He was significantly improved by up titration of his diuretics. He still has intermittent edema. We discussed torsemide as an alternative. He would like to stay on furosemide. Given Zaroxolyn 2.5 mg to take on an as-needed basis.  We spent more than 50% of our >25 min visit in face to face counseling regarding the above

## 2016-04-03 NOTE — Patient Instructions (Signed)
Medication Instructions: - Your physician has recommended you make the following change in your medication:  1) Start zaroxalyn (metolazone) 2.5 mg one tablet weekly as needed swelling  Labwork: - Your physician recommends that you have lab work today: BMP   Procedures/Testing: - none  Follow-Up: - Your physician wants you to follow-up in: 6 months with Paul Palau, [name] in the a-fib clinic & 1 year with [name]. You will receive a reminder letter in the mail two months in advance. If you don't receive a letter, please call our office to schedule the follow-up appointment.  Any Additional Special Instructions Will Be Listed Below (If Applicable).     If you need a refill on your cardiac medications before your next appointment, please call your pharmacy.

## 2016-04-04 LAB — CUP PACEART INCLINIC DEVICE CHECK
Brady Statistic RA Percent Paced: 40 %
Brady Statistic RV Percent Paced: 82 %
Implantable Lead Implant Date: 20140421
Implantable Lead Location: 753859
Implantable Lead Model: 5076
Lead Channel Impedance Value: 599 Ohm
Lead Channel Pacing Threshold Amplitude: 0.6 V
Lead Channel Pacing Threshold Amplitude: 0.6 V
Lead Channel Pacing Threshold Pulse Width: 0.4 ms
Lead Channel Sensing Intrinsic Amplitude: 4.7 mV
Lead Channel Setting Pacing Amplitude: 1.2 V
Lead Channel Setting Pacing Pulse Width: 0.4 ms
MDC IDC LEAD IMPLANT DT: 20140421
MDC IDC LEAD LOCATION: 753860
MDC IDC MSMT LEADCHNL RV IMPEDANCE VALUE: 642 Ohm
MDC IDC MSMT LEADCHNL RV PACING THRESHOLD PULSEWIDTH: 0.4 ms
MDC IDC MSMT LEADCHNL RV SENSING INTR AMPL: 9 mV
MDC IDC SESS DTM: 20170830040000
MDC IDC SET LEADCHNL RA PACING AMPLITUDE: 2 V
MDC IDC SET LEADCHNL RV SENSING SENSITIVITY: 2.5 mV
Pulse Gen Serial Number: 112305

## 2016-04-04 LAB — BASIC METABOLIC PANEL
BUN: 20 mg/dL (ref 7–25)
CO2: 27 mmol/L (ref 20–31)
Calcium: 9.3 mg/dL (ref 8.6–10.3)
Chloride: 100 mmol/L (ref 98–110)
Creat: 0.93 mg/dL (ref 0.70–1.18)
GLUCOSE: 87 mg/dL (ref 65–99)
Potassium: 4.1 mmol/L (ref 3.5–5.3)
SODIUM: 140 mmol/L (ref 135–146)

## 2016-04-23 ENCOUNTER — Encounter: Payer: Self-pay | Admitting: Internal Medicine

## 2016-05-14 ENCOUNTER — Encounter: Payer: Self-pay | Admitting: Internal Medicine

## 2016-05-14 DIAGNOSIS — Z85828 Personal history of other malignant neoplasm of skin: Secondary | ICD-10-CM | POA: Diagnosis not present

## 2016-05-14 DIAGNOSIS — Z808 Family history of malignant neoplasm of other organs or systems: Secondary | ICD-10-CM | POA: Diagnosis not present

## 2016-05-14 DIAGNOSIS — L570 Actinic keratosis: Secondary | ICD-10-CM | POA: Diagnosis not present

## 2016-05-14 DIAGNOSIS — D225 Melanocytic nevi of trunk: Secondary | ICD-10-CM | POA: Diagnosis not present

## 2016-05-14 DIAGNOSIS — Z23 Encounter for immunization: Secondary | ICD-10-CM | POA: Diagnosis not present

## 2016-05-14 DIAGNOSIS — D2271 Melanocytic nevi of right lower limb, including hip: Secondary | ICD-10-CM | POA: Diagnosis not present

## 2016-05-14 DIAGNOSIS — D2262 Melanocytic nevi of left upper limb, including shoulder: Secondary | ICD-10-CM | POA: Diagnosis not present

## 2016-05-14 DIAGNOSIS — L821 Other seborrheic keratosis: Secondary | ICD-10-CM | POA: Diagnosis not present

## 2016-05-16 ENCOUNTER — Other Ambulatory Visit: Payer: Self-pay | Admitting: *Deleted

## 2016-05-16 MED ORDER — FUROSEMIDE 80 MG PO TABS: 80.0000 mg | ORAL_TABLET | Freq: Every day | ORAL | 3 refills | Status: DC

## 2016-07-01 ENCOUNTER — Other Ambulatory Visit: Payer: Self-pay | Admitting: Internal Medicine

## 2016-07-03 ENCOUNTER — Ambulatory Visit (INDEPENDENT_AMBULATORY_CARE_PROVIDER_SITE_OTHER): Payer: Medicare Other | Admitting: *Deleted

## 2016-07-03 DIAGNOSIS — I495 Sick sinus syndrome: Secondary | ICD-10-CM | POA: Diagnosis not present

## 2016-07-04 NOTE — Progress Notes (Signed)
Remote pacemaker transmission.   

## 2016-07-16 ENCOUNTER — Encounter: Payer: Self-pay | Admitting: Cardiology

## 2016-08-15 LAB — CUP PACEART REMOTE DEVICE CHECK
Brady Statistic RV Percent Paced: 87 %
Date Time Interrogation Session: 20171129055100
Implantable Lead Implant Date: 20140421
Implantable Lead Location: 753860
Implantable Lead Model: 5076
Implantable Lead Model: 5076
Lead Channel Impedance Value: 559 Ohm
Lead Channel Impedance Value: 638 Ohm
Lead Channel Pacing Threshold Amplitude: 0.7 V
Lead Channel Pacing Threshold Pulse Width: 0.4 ms
MDC IDC LEAD IMPLANT DT: 20140421
MDC IDC LEAD LOCATION: 753859
MDC IDC MSMT BATTERY REMAINING LONGEVITY: 114 mo
MDC IDC MSMT BATTERY REMAINING PERCENTAGE: 100 %
MDC IDC MSMT LEADCHNL RA PACING THRESHOLD AMPLITUDE: 0.6 V
MDC IDC MSMT LEADCHNL RV PACING THRESHOLD PULSEWIDTH: 0.4 ms
MDC IDC PG IMPLANT DT: 20140421
MDC IDC SET LEADCHNL RA PACING AMPLITUDE: 2 V
MDC IDC SET LEADCHNL RV PACING AMPLITUDE: 1.2 V
MDC IDC SET LEADCHNL RV PACING PULSEWIDTH: 0.4 ms
MDC IDC SET LEADCHNL RV SENSING SENSITIVITY: 2.5 mV
MDC IDC STAT BRADY RA PERCENT PACED: 54 %
Pulse Gen Serial Number: 112305

## 2016-08-31 ENCOUNTER — Other Ambulatory Visit: Payer: Self-pay | Admitting: Internal Medicine

## 2016-09-04 DIAGNOSIS — H25813 Combined forms of age-related cataract, bilateral: Secondary | ICD-10-CM | POA: Diagnosis not present

## 2016-09-04 DIAGNOSIS — H524 Presbyopia: Secondary | ICD-10-CM | POA: Diagnosis not present

## 2016-09-04 DIAGNOSIS — H40013 Open angle with borderline findings, low risk, bilateral: Secondary | ICD-10-CM | POA: Diagnosis not present

## 2016-09-26 DIAGNOSIS — Z1211 Encounter for screening for malignant neoplasm of colon: Secondary | ICD-10-CM | POA: Diagnosis not present

## 2016-09-26 DIAGNOSIS — Z1159 Encounter for screening for other viral diseases: Secondary | ICD-10-CM | POA: Diagnosis not present

## 2016-09-26 DIAGNOSIS — Z0000 Encounter for general adult medical examination without abnormal findings: Secondary | ICD-10-CM | POA: Diagnosis not present

## 2016-09-26 DIAGNOSIS — F324 Major depressive disorder, single episode, in partial remission: Secondary | ICD-10-CM | POA: Diagnosis not present

## 2016-09-26 DIAGNOSIS — I480 Paroxysmal atrial fibrillation: Secondary | ICD-10-CM | POA: Diagnosis not present

## 2016-09-26 DIAGNOSIS — N529 Male erectile dysfunction, unspecified: Secondary | ICD-10-CM | POA: Diagnosis not present

## 2016-09-26 DIAGNOSIS — Z125 Encounter for screening for malignant neoplasm of prostate: Secondary | ICD-10-CM | POA: Diagnosis not present

## 2016-09-26 DIAGNOSIS — R1011 Right upper quadrant pain: Secondary | ICD-10-CM | POA: Diagnosis not present

## 2016-10-02 ENCOUNTER — Ambulatory Visit (INDEPENDENT_AMBULATORY_CARE_PROVIDER_SITE_OTHER): Payer: Medicare Other | Admitting: *Deleted

## 2016-10-02 DIAGNOSIS — I495 Sick sinus syndrome: Secondary | ICD-10-CM | POA: Diagnosis not present

## 2016-10-02 NOTE — Progress Notes (Signed)
Remote pacemaker transmission.   

## 2016-10-03 ENCOUNTER — Encounter: Payer: Self-pay | Admitting: Cardiology

## 2016-10-08 LAB — CUP PACEART REMOTE DEVICE CHECK
Battery Remaining Longevity: 102 mo
Battery Remaining Percentage: 100 %
Brady Statistic RA Percent Paced: 48 %
Brady Statistic RV Percent Paced: 88 %
Date Time Interrogation Session: 20180228055100
Implantable Lead Implant Date: 20140421
Implantable Lead Location: 753859
Implantable Lead Location: 753860
Implantable Lead Model: 5076
Implantable Lead Model: 5076
Lead Channel Impedance Value: 599 Ohm
Lead Channel Pacing Threshold Amplitude: 0.6 V
Lead Channel Setting Pacing Amplitude: 1.2 V
Lead Channel Setting Pacing Amplitude: 2 V
Lead Channel Setting Pacing Pulse Width: 0.4 ms
MDC IDC LEAD IMPLANT DT: 20140421
MDC IDC MSMT LEADCHNL RA PACING THRESHOLD PULSEWIDTH: 0.4 ms
MDC IDC MSMT LEADCHNL RV IMPEDANCE VALUE: 642 Ohm
MDC IDC MSMT LEADCHNL RV PACING THRESHOLD AMPLITUDE: 0.7 V
MDC IDC MSMT LEADCHNL RV PACING THRESHOLD PULSEWIDTH: 0.4 ms
MDC IDC PG IMPLANT DT: 20140421
MDC IDC PG SERIAL: 112305
MDC IDC SET LEADCHNL RV SENSING SENSITIVITY: 2.5 mV

## 2016-10-25 DIAGNOSIS — H1013 Acute atopic conjunctivitis, bilateral: Secondary | ICD-10-CM | POA: Diagnosis not present

## 2016-10-25 DIAGNOSIS — R05 Cough: Secondary | ICD-10-CM | POA: Diagnosis not present

## 2016-10-26 ENCOUNTER — Other Ambulatory Visit: Payer: Self-pay | Admitting: Internal Medicine

## 2016-11-05 DIAGNOSIS — Z1211 Encounter for screening for malignant neoplasm of colon: Secondary | ICD-10-CM | POA: Diagnosis not present

## 2017-01-01 ENCOUNTER — Ambulatory Visit (INDEPENDENT_AMBULATORY_CARE_PROVIDER_SITE_OTHER): Payer: Medicare Other | Admitting: *Deleted

## 2017-01-01 DIAGNOSIS — I495 Sick sinus syndrome: Secondary | ICD-10-CM | POA: Diagnosis not present

## 2017-01-03 NOTE — Progress Notes (Signed)
Remote pacemaker transmission.   

## 2017-01-10 ENCOUNTER — Encounter: Payer: Self-pay | Admitting: Cardiology

## 2017-01-14 LAB — CUP PACEART REMOTE DEVICE CHECK
Battery Remaining Longevity: 90 mo
Date Time Interrogation Session: 20180530050300
Implantable Lead Implant Date: 20140421
Implantable Lead Implant Date: 20140421
Implantable Lead Location: 753860
Implantable Lead Model: 5076
Implantable Pulse Generator Implant Date: 20140421
Lead Channel Impedance Value: 673 Ohm
Lead Channel Pacing Threshold Amplitude: 0.8 V
Lead Channel Pacing Threshold Pulse Width: 0.4 ms
Lead Channel Setting Pacing Amplitude: 2 V
Lead Channel Setting Sensing Sensitivity: 2.5 mV
MDC IDC LEAD LOCATION: 753859
MDC IDC MSMT BATTERY REMAINING PERCENTAGE: 95 %
MDC IDC MSMT LEADCHNL RA IMPEDANCE VALUE: 644 Ohm
MDC IDC MSMT LEADCHNL RA PACING THRESHOLD AMPLITUDE: 0.6 V
MDC IDC MSMT LEADCHNL RA PACING THRESHOLD PULSEWIDTH: 0.4 ms
MDC IDC PG SERIAL: 112305
MDC IDC SET LEADCHNL RV PACING AMPLITUDE: 1.3 V
MDC IDC SET LEADCHNL RV PACING PULSEWIDTH: 0.4 ms
MDC IDC STAT BRADY RA PERCENT PACED: 30 %
MDC IDC STAT BRADY RV PERCENT PACED: 87 %

## 2017-03-11 DIAGNOSIS — J9801 Acute bronchospasm: Secondary | ICD-10-CM | POA: Diagnosis not present

## 2017-03-11 DIAGNOSIS — J069 Acute upper respiratory infection, unspecified: Secondary | ICD-10-CM | POA: Diagnosis not present

## 2017-03-11 DIAGNOSIS — J200 Acute bronchitis due to Mycoplasma pneumoniae: Secondary | ICD-10-CM | POA: Diagnosis not present

## 2017-03-24 ENCOUNTER — Other Ambulatory Visit: Payer: Self-pay | Admitting: Internal Medicine

## 2017-04-01 ENCOUNTER — Encounter: Payer: Self-pay | Admitting: Internal Medicine

## 2017-04-02 ENCOUNTER — Ambulatory Visit (INDEPENDENT_AMBULATORY_CARE_PROVIDER_SITE_OTHER): Payer: Medicare Other | Admitting: *Deleted

## 2017-04-02 ENCOUNTER — Other Ambulatory Visit: Payer: Self-pay | Admitting: *Deleted

## 2017-04-02 DIAGNOSIS — I495 Sick sinus syndrome: Secondary | ICD-10-CM | POA: Diagnosis not present

## 2017-04-02 MED ORDER — DOFETILIDE 500 MCG PO CAPS: ORAL_CAPSULE | ORAL | 0 refills | Status: DC

## 2017-04-03 NOTE — Progress Notes (Signed)
Remote pacemaker transmission.   

## 2017-04-04 LAB — CUP PACEART REMOTE DEVICE CHECK
Battery Remaining Longevity: 84 mo
Brady Statistic RA Percent Paced: 18 %
Brady Statistic RV Percent Paced: 84 %
Implantable Lead Implant Date: 20140421
Implantable Lead Location: 753859
Lead Channel Impedance Value: 620 Ohm
Lead Channel Impedance Value: 673 Ohm
Lead Channel Pacing Threshold Pulse Width: 0.4 ms
Lead Channel Setting Pacing Amplitude: 1.3 V
Lead Channel Setting Pacing Amplitude: 2 V
MDC IDC LEAD IMPLANT DT: 20140421
MDC IDC LEAD LOCATION: 753860
MDC IDC MSMT BATTERY REMAINING PERCENTAGE: 90 %
MDC IDC MSMT LEADCHNL RA PACING THRESHOLD AMPLITUDE: 0.6 V
MDC IDC MSMT LEADCHNL RV PACING THRESHOLD AMPLITUDE: 0.7 V
MDC IDC MSMT LEADCHNL RV PACING THRESHOLD PULSEWIDTH: 0.4 ms
MDC IDC PG IMPLANT DT: 20140421
MDC IDC SESS DTM: 20180829184800
MDC IDC SET LEADCHNL RV PACING PULSEWIDTH: 0.4 ms
MDC IDC SET LEADCHNL RV SENSING SENSITIVITY: 2.5 mV
Pulse Gen Serial Number: 112305

## 2017-04-15 ENCOUNTER — Encounter: Payer: Self-pay | Admitting: Cardiology

## 2017-04-15 DIAGNOSIS — J069 Acute upper respiratory infection, unspecified: Secondary | ICD-10-CM | POA: Diagnosis not present

## 2017-04-23 ENCOUNTER — Other Ambulatory Visit: Payer: Self-pay | Admitting: Internal Medicine

## 2017-04-29 ENCOUNTER — Encounter: Payer: Self-pay | Admitting: Internal Medicine

## 2017-04-29 ENCOUNTER — Ambulatory Visit (INDEPENDENT_AMBULATORY_CARE_PROVIDER_SITE_OTHER): Payer: Medicare Other | Admitting: Internal Medicine

## 2017-04-29 VITALS — BP 110/76 | HR 78 | Ht 72.0 in | Wt 210.0 lb

## 2017-04-29 DIAGNOSIS — I480 Paroxysmal atrial fibrillation: Secondary | ICD-10-CM

## 2017-04-29 DIAGNOSIS — Z79899 Other long term (current) drug therapy: Secondary | ICD-10-CM

## 2017-04-29 DIAGNOSIS — I495 Sick sinus syndrome: Secondary | ICD-10-CM

## 2017-04-29 DIAGNOSIS — Z950 Presence of cardiac pacemaker: Secondary | ICD-10-CM | POA: Diagnosis not present

## 2017-04-29 DIAGNOSIS — I440 Atrioventricular block, first degree: Secondary | ICD-10-CM | POA: Diagnosis not present

## 2017-04-29 LAB — CUP PACEART INCLINIC DEVICE CHECK
Implantable Lead Implant Date: 20140421
Implantable Lead Location: 753859
Implantable Lead Location: 753860
Implantable Pulse Generator Implant Date: 20140421
Lead Channel Impedance Value: 590 Ohm
Lead Channel Pacing Threshold Amplitude: 0.6 V
Lead Channel Pacing Threshold Pulse Width: 0.4 ms
Lead Channel Setting Pacing Amplitude: 1.3 V
Lead Channel Setting Pacing Pulse Width: 0.4 ms
MDC IDC LEAD IMPLANT DT: 20140421
MDC IDC MSMT LEADCHNL RA SENSING INTR AMPL: 4.1 mV
MDC IDC MSMT LEADCHNL RV IMPEDANCE VALUE: 645 Ohm
MDC IDC MSMT LEADCHNL RV PACING THRESHOLD AMPLITUDE: 0.8 V
MDC IDC MSMT LEADCHNL RV PACING THRESHOLD PULSEWIDTH: 0.4 ms
MDC IDC MSMT LEADCHNL RV SENSING INTR AMPL: 9.9 mV
MDC IDC SESS DTM: 20180925040000
MDC IDC SET LEADCHNL RA PACING AMPLITUDE: 2 V
MDC IDC SET LEADCHNL RV SENSING SENSITIVITY: 2.5 mV
Pulse Gen Serial Number: 112305

## 2017-04-29 NOTE — Patient Instructions (Signed)
Medication Instructions: - Your physician recommends that you continue on your current medications as directed. Please refer to the Current Medication list given to you today.  Labwork: - Your physician recommends that you have lab work today: BMP/Magnesium/ CBC  Procedures/Testing: - none ordered  Follow-Up: - Remote monitoring is used to monitor your Pacemaker of ICD from home. This monitoring reduces the number of office visits required to check your device to one time per year. It allows [place] to keep an eye on the functioning of your device to ensure it is working properly. You are scheduled for a device check from home on 07/02/17. You may send your transmission at any time that day. If you have a wireless device, the transmission will be sent automatically. After your physician reviews your transmission, you will receive a postcard with your next transmission date.  - Your physician wants you to follow-up in: 6 months with Tommye Standard, PA for Dr. Caryl Comes & 1 year with Dr. Caryl Comes. You will receive a reminder letter in the mail two months in advance. If you don't receive a letter, please call our office to schedule the follow-up appointment.   Any Additional Special Instructions Will Be Listed Below (If Applicable).     If you need a refill on your cardiac medications before your next appointment, please call your pharmacy.

## 2017-04-29 NOTE — Progress Notes (Signed)
Patient Care Team: [name], MD as PCP - General (Family Medicine)   HPI  [name] is a 71 y.o. 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  He did not have palpitations.  He has some chest pain. This is not exertionally triggered. It can last seconds or minutes. He describes it as a pressure or tightness without radiation and without shortness of breath.  He complains of nocturnal dyspnea. This is frequently relieved by rolling over. He also has some dyspnea on exertion. He notes that he tends to accumulate fluid over a week to 2 weeks and and when he takes Zaroxolyn every other week  he will lose about 7-10 pounds of water.     Thromboembolic risk profile is notable for a CHADS-VASc score of 3. He is on Rivaroxaban       Date Cr K  2/17  1.08 3.7  8/17  0.93 4.1    DATE TEST    4/13    Echo   EF 55-65 %   3/14    Myoview   EF   % Images Personally reviewed  Normal            LV function normal by echo 2013&  myoview 2014  Past Medical History:  Diagnosis Date  . 1st degree AV block   . AF (atrial fibrillation) ([place])    recent new occurrence 3/13>>Tikosyn  . Atrial flutter ([place])    s/p ablation 2006  . CAD (coronary artery disease)    Mild 2 vessel disease per cath in 2006; myoview neg 2011  . Chronic anticoagulation    on Xarelto - started 11/12/11  . Depression 11-05-11   On Cymbalta  . GERD (gastroesophageal reflux disease)   . Hyperlipidemia   . Hypertension   . Normal nuclear stress test 2011  . Pacemaker 11/23/2012   Dr Caryl Comes  . PVC's (premature ventricular contractions)   . Right ventricular enlargement    Mild noted echo 3/13  . Shortness of breath   . Tobacco abuse     Past Surgical History:  Procedure Laterality Date  . CARDIAC CATHETERIZATION  11/1988, 12/07/2004   showing essentially normal left  ventricular function, moderate two-vessel disease (50% LAD after the third diagonal and a 60+% stenosis in the second obtuse marginal)         . CARDIOVERSION N/A 12/05/2011   Procedure: CARDIOVERSION;  Surgeon: Deboraha Sprang, MD;  Location: [facility] CATH LAB;  Service: Cardiovascular;  Laterality: N/A;  . FOOT SURGERY  1996   left-retained hardware  . HERNIA REPAIR    . INGUINAL HERNIA REPAIR  01/28/2008   Recurrent right inguinal hernia Laparoscopic, preperitoneal repair of recurrent right inguinal hernia with mesh -- SURGEON:  Dr. Fanny Skates.  . INGUINAL HERNIA REPAIR  11/06/2011   Procedure: HERNIA REPAIR INGUINAL ADULT;  Surgeon: Adin Hector, MD;  Location: [facility] ORS;  Service: General;  Laterality: Left;  . PACEMAKER INSERTION  11/23/2012   Dr Caryl Comes  . PERMANENT PACEMAKER INSERTION N/A 11/23/2012   Procedure: PERMANENT PACEMAKER INSERTION;  Surgeon: Deboraha Sprang, MD;  [facility] C[facility];  Service: Cardiovascular;  Laterality: N/A;  . TEE WITH CARDIOVERSION  2001, 2006   Invasive electrophysiology study, electroanatomical mapping,and radiofrequency catheter ablation. -- patient's arrhythmia was isthmus dependent flutter not withstanding the  very long cycle length.  RF energy delivered across the cavotricuspid isthmus successfully interrupted conduction across the isthmus and eliminated the patient's sub-straight. PHYSICIAN:  Deboraha Sprang, M.D  . TEE WITHOUT CARDIOVERSION  12/31/2011   Procedure: TRANSESOPHAGEAL ECHOCARDIOGRAM (TEE);  Surgeon: Josue Hector, MD;  Location: [facility] ENDOSCOPY;  Service: Cardiovascular;  Laterality: N/A;  tba after for tikosyn  . VASECTOMY      Current Outpatient Prescriptions  Medication Sig Dispense Refill  . dofetilide (TIKOSYN) 500 MCG capsule TAKE 1 CAPSULE EVERY 12 HOURS 90 capsule 0  . furosemide (LASIX) 80 MG tablet TAKE 1 TABLET DAILY 90 tablet 0  . metolazone (ZAROXOLYN) 2.5 MG tablet TAKE 1 TABLET ONCE A WEEK AS  NEEDED FOR SWELLING 15 tablet 1  . Omega-3 Fatty Acids (FISH OIL) 1000 MG CAPS Take 1,000 mg by mouth 2 (two) times daily.    [name] VIAGRA 100 MG tablet Take 1 tablet by mouth as needed for erectile dysfunction.    Alveda Reasons 20 MG TABS tablet TAKE 1 TABLET DAILY 90 tablet 3   No current facility-administered medications for this visit.     Allergies  Allergen Reactions  . Horse-Derived Products Hives    Review of Systems negative except from HPI and PMH  Physical Exam BP 110/76   Pulse 78   Ht 6' (1.829 m)   Wt 210 lb (95.3 kg)   SpO2 97%   BMI 28.48 kg/m  Well developed and well nourished in no acute distress HENT normal E scleral and icterus clear Neck Supple JVP flat; carotids brisk and full Clear to ausculation  Regular rate and rhythm, no murmurs gallops or rub Soft with active bowel sounds No clubbing cyanosis  1-2+  Edema Alert and oriented, grossly normal motor and sensory function Skin Warm and Dry  ECG demonstrates AFib with Vpacing @60   -/18/50   Assessment and  Plan  Sinus node dysfunction  Atrial  Fibrillation on tikosyn  Pacemaker  BSx  Hypertension  1AVB profound/ IVCD  HFpEF  Chest pain  Atrial fib burden remains about 40 %  For now will continue dofetilide though alternative strategies ie ranolazine, amio or ablation will be kept in mind  Will check surveillance labs  With his dyspnea and significant volume shifts as noted above I worry about HFpEF. We have talked about different diuretic strategies including using metolazone once a week at 2.5 mg, increasing his daily furosemide from 80--120, he remains reluctant to try torsemide. I've also encouraged him to decrease his salt water and alcohol intake.  We will check an echocardiogram.  With his chest pain, his probabilities would be intermediate. CTA might be most useful. We will follow his echo  We spent more than 50% of our >40 min visit in face to face counseling regarding the  above

## 2017-04-30 LAB — CBC WITH DIFFERENTIAL/PLATELET
BASOS ABS: 0 10*3/uL (ref 0.0–0.2)
Basos: 1 %
EOS (ABSOLUTE): 0.2 10*3/uL (ref 0.0–0.4)
Eos: 3 %
HEMOGLOBIN: 16 g/dL (ref 13.0–17.7)
Hematocrit: 48 % (ref 37.5–51.0)
IMMATURE GRANS (ABS): 0 10*3/uL (ref 0.0–0.1)
IMMATURE GRANULOCYTES: 0 %
LYMPHS: 34 %
Lymphocytes Absolute: 2.4 10*3/uL (ref 0.7–3.1)
MCH: 32.3 pg (ref 26.6–33.0)
MCHC: 33.3 g/dL (ref 31.5–35.7)
MCV: 97 fL (ref 79–97)
MONOCYTES: 8 %
Monocytes Absolute: 0.5 10*3/uL (ref 0.1–0.9)
NEUTROS PCT: 54 %
Neutrophils Absolute: 3.8 10*3/uL (ref 1.4–7.0)
PLATELETS: 233 10*3/uL (ref 150–379)
RBC: 4.95 x10E6/uL (ref 4.14–5.80)
RDW: 14 % (ref 12.3–15.4)
WBC: 7 10*3/uL (ref 3.4–10.8)

## 2017-04-30 LAB — BASIC METABOLIC PANEL
BUN/Creatinine Ratio: 18 (ref 10–24)
BUN: 16 mg/dL (ref 8–27)
CALCIUM: 9.8 mg/dL (ref 8.6–10.2)
CHLORIDE: 102 mmol/L (ref 96–106)
CO2: 29 mmol/L (ref 20–29)
CREATININE: 0.91 mg/dL (ref 0.76–1.27)
GFR calc Af Amer: 96 mL/min/{1.73_m2} (ref >59)
GFR calc non Af Amer: 83 mL/min/{1.73_m2} (ref >59)
GLUCOSE: 95 mg/dL (ref 65–99)
Potassium: 4.9 mmol/L (ref 3.5–5.2)
Sodium: 143 mmol/L (ref 134–144)

## 2017-04-30 LAB — MAGNESIUM: MAGNESIUM: 2.2 mg/dL (ref 1.6–2.3)

## 2017-05-05 ENCOUNTER — Telehealth: Payer: Self-pay

## 2017-05-05 NOTE — Telephone Encounter (Signed)
Pt is aware and agreeable to normal results. He informed me that he had already seen his results on mychart but was appreciative for the call.

## 2017-05-15 DIAGNOSIS — D485 Neoplasm of uncertain behavior of skin: Secondary | ICD-10-CM | POA: Diagnosis not present

## 2017-05-15 DIAGNOSIS — L821 Other seborrheic keratosis: Secondary | ICD-10-CM | POA: Diagnosis not present

## 2017-05-15 DIAGNOSIS — D2262 Melanocytic nevi of left upper limb, including shoulder: Secondary | ICD-10-CM | POA: Diagnosis not present

## 2017-05-15 DIAGNOSIS — Z23 Encounter for immunization: Secondary | ICD-10-CM | POA: Diagnosis not present

## 2017-05-15 DIAGNOSIS — L570 Actinic keratosis: Secondary | ICD-10-CM | POA: Diagnosis not present

## 2017-05-15 DIAGNOSIS — D225 Melanocytic nevi of trunk: Secondary | ICD-10-CM | POA: Diagnosis not present

## 2017-05-15 DIAGNOSIS — Z85828 Personal history of other malignant neoplasm of skin: Secondary | ICD-10-CM | POA: Diagnosis not present

## 2017-05-15 DIAGNOSIS — Z808 Family history of malignant neoplasm of other organs or systems: Secondary | ICD-10-CM | POA: Diagnosis not present

## 2017-05-15 DIAGNOSIS — L820 Inflamed seborrheic keratosis: Secondary | ICD-10-CM | POA: Diagnosis not present

## 2017-05-15 DIAGNOSIS — D2271 Melanocytic nevi of right lower limb, including hip: Secondary | ICD-10-CM | POA: Diagnosis not present

## 2017-06-13 ENCOUNTER — Other Ambulatory Visit: Payer: Self-pay | Admitting: Internal Medicine

## 2017-07-02 ENCOUNTER — Ambulatory Visit (INDEPENDENT_AMBULATORY_CARE_PROVIDER_SITE_OTHER): Payer: Medicare Other | Admitting: *Deleted

## 2017-07-02 DIAGNOSIS — I495 Sick sinus syndrome: Secondary | ICD-10-CM | POA: Diagnosis not present

## 2017-07-02 NOTE — Progress Notes (Signed)
Remote pacemaker transmission.   

## 2017-07-04 ENCOUNTER — Encounter: Payer: Self-pay | Admitting: Cardiology

## 2017-07-15 LAB — CUP PACEART REMOTE DEVICE CHECK
Brady Statistic RA Percent Paced: 11 %
Brady Statistic RV Percent Paced: 84 %
Implantable Lead Implant Date: 20140421
Implantable Lead Location: 753859
Implantable Lead Model: 5076
Implantable Pulse Generator Implant Date: 20140421
Lead Channel Impedance Value: 536 Ohm
Lead Channel Impedance Value: 658 Ohm
Lead Channel Pacing Threshold Pulse Width: 0.4 ms
Lead Channel Pacing Threshold Pulse Width: 0.4 ms
Lead Channel Setting Pacing Amplitude: 1.2 V
MDC IDC LEAD IMPLANT DT: 20140421
MDC IDC LEAD LOCATION: 753860
MDC IDC MSMT BATTERY REMAINING LONGEVITY: 96 mo
MDC IDC MSMT BATTERY REMAINING PERCENTAGE: 100 %
MDC IDC MSMT LEADCHNL RA PACING THRESHOLD AMPLITUDE: 0.6 V
MDC IDC MSMT LEADCHNL RV PACING THRESHOLD AMPLITUDE: 0.9 V
MDC IDC PG SERIAL: 112305
MDC IDC SESS DTM: 20181128060200
MDC IDC SET LEADCHNL RA PACING AMPLITUDE: 2 V
MDC IDC SET LEADCHNL RV PACING PULSEWIDTH: 0.4 ms
MDC IDC SET LEADCHNL RV SENSING SENSITIVITY: 2.5 mV

## 2017-07-22 ENCOUNTER — Other Ambulatory Visit: Payer: Self-pay | Admitting: Internal Medicine

## 2017-08-12 DIAGNOSIS — J3489 Other specified disorders of nose and nasal sinuses: Secondary | ICD-10-CM | POA: Diagnosis not present

## 2017-09-03 ENCOUNTER — Ambulatory Visit (INDEPENDENT_AMBULATORY_CARE_PROVIDER_SITE_OTHER): Payer: Medicare Other | Admitting: Allergy

## 2017-09-03 ENCOUNTER — Encounter: Payer: Self-pay | Admitting: Allergy

## 2017-09-03 VITALS — BP 124/86 | HR 57 | Ht 72.0 in | Wt 215.2 lb

## 2017-09-03 DIAGNOSIS — J3089 Other allergic rhinitis: Secondary | ICD-10-CM

## 2017-09-03 DIAGNOSIS — R062 Wheezing: Secondary | ICD-10-CM | POA: Diagnosis not present

## 2017-09-03 DIAGNOSIS — J302 Other seasonal allergic rhinitis: Secondary | ICD-10-CM | POA: Diagnosis not present

## 2017-09-03 MED ORDER — AZELASTINE HCL 0.1 % NA SOLN: 2.0000 | Freq: Two times a day (BID) | NASAL | 5 refills | Status: DC

## 2017-09-03 MED ORDER — MONTELUKAST SODIUM 10 MG PO TABS: 10.0000 mg | ORAL_TABLET | Freq: Every day | ORAL | 5 refills | Status: DC

## 2017-09-03 MED ORDER — ALBUTEROL SULFATE HFA 108 (90 BASE) MCG/ACT IN AERS: 2.0000 | INHALATION_SPRAY | RESPIRATORY_TRACT | 3 refills | Status: DC | PRN

## 2017-09-03 NOTE — Progress Notes (Signed)
New Patient Note  RE: [name] MRN: [id number] DOB: 10/29/1945 Date of Office Visit: 09/03/2017  Referring provider: [name], MD Primary care provider: [name], MD  Chief Complaint: Thick post nasal drainage.  History of present illness: [name] is a 72 y.o. male presenting today for consultation for thick nasal drainage that started 2-3 years ago, is thick, and is intermittent, occurring every few hours to every few weeks. He reports that he was seen by Dr Alyson Ingles in August, 2018 for evaluation of a cough and was diagnosed with bronchitis and treated with an antibiotic and an albuterol inhaler with relief of symptoms. He reports that the cough is gone, however, the thick drainage remains. He reports feeling as though his throat is closing when he is lying on his back which resolves when he turns onto his side. He denies fever, sweats, chills, and sick contacts. He has tried Mucinex which he reported as not providing symptom relief and has started using Flonase and Zertec in August with moderate relief. He eats a varied diet and denies concern for food allergies. He reports daily consumption of red wine and occasional bourbon. He denies a history of asthma, however, he did report wheezing and use of an albuterol inhaler with the bronchitis in August, 2018. 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  He denies a history of GERD. He reports that his friend has a cat that he is infrequently exposed to.   Review of systems: Review of Systems  Constitutional: Negative.   HENT: Negative.   Eyes: Negative.   Respiratory:       Wheezing during respiratory illness in August. Thick post nasal drainage and frequent throat clearing  Cardiovascular:       Denies chest pain or palpitations.  Gastrointestinal: Negative.   Genitourinary: Negative.   Musculoskeletal: Negative.   Skin: Negative.     All other systems  negative unless noted above in HPI  Past medical history: Past Medical History:  Diagnosis Date  . 1st degree AV block   . AF (atrial fibrillation) ([place])    recent new occurrence 3/13>>Tikosyn  . Atrial flutter ([place])    s/p ablation 2006  . CAD (coronary artery disease)    Mild 2 vessel disease per cath in 2006; myoview neg 2011  . Chronic anticoagulation    on Xarelto - started 11/12/11  . Depression 11-05-11   On Cymbalta  . GERD (gastroesophageal reflux disease)   . Hyperlipidemia   . Hypertension   . Normal nuclear stress test 2011  . Pacemaker 11/23/2012   Dr Caryl Comes  . PVC's (premature ventricular contractions)   . Right ventricular enlargement    Mild noted echo 3/13  . Shortness of breath   . Tobacco abuse     Past surgical history: Past Surgical History:  Procedure Laterality Date  . CARDIAC CATHETERIZATION  11/1988, 12/07/2004   showing essentially normal left  ventricular function, moderate two-vessel disease (50% LAD after the third diagonal and a 60+% stenosis in the second obtuse marginal)         . CARDIOVERSION N/A 12/05/2011   Procedure: CARDIOVERSION;  Surgeon: Deboraha Sprang, MD;  Location: [facility] C[facility] LAB;  Service: Cardiovascular;  Laterality: N/A;  . FOOT SURGERY  1996   left-retained hardware  . HERNIA REPAIR    . INGUINAL HERNIA REPAIR  01/28/2008   Recurrent right inguinal hernia Laparoscopic, preperitoneal  repair of recurrent right inguinal hernia with mesh -- SURGEON:  Dr. Fanny Skates.  . INGUINAL HERNIA REPAIR  11/06/2011   Procedure: HERNIA REPAIR INGUINAL ADULT;  Surgeon: Adin Hector, MD;  Location: [facility] ORS;  Service: General;  Laterality: Left;  . PACEMAKER INSERTION  11/23/2012   Dr Caryl Comes  . PERMANENT PACEMAKER INSERTION N/A 11/23/2012   Procedure: PERMANENT PACEMAKER INSERTION;  Surgeon: Deboraha Sprang, MD;  Location: [facility] CATH LAB;  Service: Cardiovascular;  Laterality: N/A;  . TEE WITH CARDIOVERSION  2001, 2006   Invasive electrophysiology study,  electroanatomical mapping,and radiofrequency catheter ablation. -- patient's arrhythmia was isthmus dependent flutter not withstanding the very long cycle length.  RF energy delivered across the cavotricuspid isthmus successfully interrupted conduction across the isthmus and eliminated the patient's sub-straight. PHYSICIAN:  Deboraha Sprang, M.D  . TEE WITHOUT CARDIOVERSION  12/31/2011   Procedure: TRANSESOPHAGEAL ECHOCARDIOGRAM (TEE);  Surgeon: Josue Hector, MD;  Location: [facility] ENDOSCOPY;  Service: Cardiovascular;  Laterality: N/A;  tba after for tikosyn  . VASECTOMY      Family history:  Family History  Problem Relation Age of Onset  . Heart attack Father 78  . Cancer Father        colon  . Kidney disease Father        End Stage  . Coronary artery disease Father        Had Valve Replacement  . Atrial fibrillation Mother        flutter  . Heart failure Mother 71  . Cancer Sister        colon  . Cancer Paternal Uncle        lung    Social history: Social History   Socioeconomic History  . Marital status: Widowed    Spouse name: Not on file  . Number of children: Not on file  . Years of education: Not on file  . Highest education level: Not on file  Social Needs  . Financial resource strain: Not on file  . Food insecurity - worry: Not on file  . Food insecurity - inability: Not on file  . Transportation needs - medical: Not on file  . Transportation needs - non-medical: Not on file  Occupational History  . Not on file  Tobacco Use  . Smoking status: Former Smoker    Years: 4.00    Types: Cigarettes    Last attempt to quit: 08/05/1966    Years since quitting: 51.1  . Smokeless tobacco: Never Used  Substance and Sexual Activity  . Alcohol use: Yes    Alcohol/week: 1.2 oz    Types: 2 Shots of liquor per week    Comment: DAILY  . Drug use: No  . Sexual activity: No  Other Topics Concern  . Not on file  Social History Narrative  .  [name] lives in a 74 year old house  with the flooring in the family room and tile in the bedroom.  Heating is gas and cooling is central.  There are no animals located inside the home or in the bedroom.  There are no dust mite free covers for the bed or pillows.  There is no concern for exposure to fumes, chemicals, dust in the home.  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      Medication List: Allergies as of 09/03/2017      Reactions   Horse-derived Products Hives      Medication List        Accurate  as of 09/03/17  9:06 PM. Always use your most recent med list.          albuterol 108 (90 Base) MCG/ACT inhaler Commonly known as:  PROAIR HFA Inhale 2 puffs into the lungs every 4 (four) hours as needed for wheezing or shortness of breath.   azelastine 0.1 % nasal spray Commonly known as:  ASTELIN Place 2 sprays into both nostrils 2 (two) times daily.   cetirizine 10 MG tablet Commonly known as:  ZYRTEC Take 10 mg by mouth daily.   dofetilide 500 MCG capsule Commonly known as:  TIKOSYN Take 1 capsule (500 mcg total) every 12 (twelve) hours by mouth.   Fish Oil 1000 MG Caps Take 1,000 mg by mouth 2 (two) times daily.   fluticasone 50 MCG/ACT nasal spray Commonly known as:  FLONASE Place into both nostrils daily.   furosemide 80 MG tablet Commonly known as:  LASIX Take 1 tablet (80 mg total) by mouth daily.   guaiFENesin 600 MG 12 hr tablet Commonly known as:  MUCINEX Take by mouth 2 (two) times daily.   metolazone 2.5 MG tablet Commonly known as:  ZAROXOLYN TAKE 1 TABLET ONCE A WEEK AS NEEDED FOR SWELLING   montelukast 10 MG tablet Commonly known as:  SINGULAIR Take 1 tablet (10 mg total) by mouth at bedtime.   naproxen sodium 220 MG tablet Commonly known as:  ALEVE Take 220 mg by mouth.   VIAGRA 100 MG tablet Generic drug:  sildenafil Take 1 tablet by mouth as needed for erectile dysfunction.   XARELTO 20 MG Tabs tablet Generic drug:  rivaroxaban TAKE 1 TABLET DAILY       Known  medication allergies: Allergies  Allergen Reactions  . Horse-Derived Products Hives     Physical examination: Blood pressure 124/86, pulse (!) 57, height 6' (1.829 m), weight 215 lb 3.2 oz (97.6 kg), SpO2 98 %.  General: Alert, interactive, in no acute distress. HEENT: TMs pearly gray, turbinates mildly edematous without discharge, post-pharynx moderately erythematous. Neck: Supple without lymphadenopathy. Lungs: Clear to auscultation without wheezing, rhonchi or rales. {no increased work of breathing. CV: Normal S1, S2 without murmurs. Abdomen: Nondistended, nontender. Skin: Warm and dry, with scattered erythematous papular lesions. No open areas or drainage noted. Extremities:  No clubbing, cyanosis or edema. Neuro:   Grossly intact.  Diagnositics:  Spirometry: FEV1: 1.87, FVC: 2.92, ratio consistent with Mild restriction and mild obstruction with no significant improvement post bronchodilator treatment.   Allergy testing:  Allergy testing results were read and interpreted by provider, documented by clinical staff. He was positive on percutaneous skin testing to dust mite and fusarium moniliforme. Positive intradermal testing was positive to Major mold mix 2, Major mold mix 3, and equivocal to Johnson grass pollen, cat hair, and [place] cockroach.    Assessment and plan: Patient Instructions  Rhinitis - Your skin testing today indicated allergy to dust mite, mold, and equivocal to cat and cockroach - Strict avoidance of allergens. Written information provided at this visit.   - Begin nasal saline rinses prior to nasal spays - Begin Astelin nasal spray. Continue Flonase nasal spray as needed for stuffy nose - Continue Zyrtec 10 mg once a day for a runny nose - Begin montelukast 10 mg once a day   Wheezing - Your lung function today did not show significant improvement after the nebulizer treatment in the office.  - Begin montelukast as noted above - ProAir 2 puffs every 4  hours as needed for  shortness of breath, cough,  or wheeze  Continue the other medications listed in your chart as ordered  Follow up in 3 months or sooner as needed   Return in about 3 months (around 12/02/2017), or if symptoms worsen or fail to improve.  I appreciate the opportunity to take part in [name]'s care. Please do not hesitate to contact me with questions.  Sincerely,  Gareth Morgan, FNP [facility]

## 2017-09-03 NOTE — Patient Instructions (Addendum)
Rhinitis - Your skin testing today indicated allergy to dust mite, mold, and equivocal to cat and cockroach - Strict avoidance of allergens. Written information provided at this visit.   - Begin nasal saline rinses prior to nasal spays - Begin Astelin nasal spray. Continue Flonase nasal spray as needed for stuffy nose - Continue Zyrtec 10 mg once a day for a runny nose - Begin montelukast 10 mg once a day   Wheezing - Your lung function today did not show significant improvement after the nebulizer treatment in the office.  - Begin montelukast as noted above - ProAir 2 puffs every 4 hours as needed for shortness of breath, cough,  or wheeze  Continue the other medications listed in your chart as ordered  Follow up in 3 months or sooner as needed

## 2017-09-11 DIAGNOSIS — H40013 Open angle with borderline findings, low risk, bilateral: Secondary | ICD-10-CM | POA: Diagnosis not present

## 2017-09-11 DIAGNOSIS — H25813 Combined forms of age-related cataract, bilateral: Secondary | ICD-10-CM | POA: Diagnosis not present

## 2017-09-11 DIAGNOSIS — H524 Presbyopia: Secondary | ICD-10-CM | POA: Diagnosis not present

## 2017-09-18 ENCOUNTER — Other Ambulatory Visit: Payer: Self-pay

## 2017-09-19 ENCOUNTER — Other Ambulatory Visit: Payer: Self-pay

## 2017-09-19 MED ORDER — MONTELUKAST SODIUM 10 MG PO TABS: 10.0000 mg | ORAL_TABLET | Freq: Every day | ORAL | 3 refills | Status: DC

## 2017-09-19 MED ORDER — AZELASTINE HCL 0.1 % NA SOLN: 2.0000 | Freq: Two times a day (BID) | NASAL | 3 refills | Status: DC

## 2017-09-22 ENCOUNTER — Other Ambulatory Visit: Payer: Self-pay | Admitting: Allergy

## 2017-09-22 MED ORDER — MONTELUKAST SODIUM 10 MG PO TABS: 10.0000 mg | ORAL_TABLET | Freq: Every day | ORAL | 1 refills | Status: DC

## 2017-09-22 MED ORDER — AZELASTINE HCL 0.1 % NA SOLN: NASAL | 1 refills | Status: DC

## 2017-09-29 DIAGNOSIS — N529 Male erectile dysfunction, unspecified: Secondary | ICD-10-CM | POA: Diagnosis not present

## 2017-09-29 DIAGNOSIS — Z6829 Body mass index (BMI) 29.0-29.9, adult: Secondary | ICD-10-CM | POA: Diagnosis not present

## 2017-09-29 DIAGNOSIS — Z1211 Encounter for screening for malignant neoplasm of colon: Secondary | ICD-10-CM | POA: Diagnosis not present

## 2017-09-29 DIAGNOSIS — J3089 Other allergic rhinitis: Secondary | ICD-10-CM | POA: Diagnosis not present

## 2017-09-29 DIAGNOSIS — M720 Palmar fascial fibromatosis [Dupuytren]: Secondary | ICD-10-CM | POA: Diagnosis not present

## 2017-09-29 DIAGNOSIS — I480 Paroxysmal atrial fibrillation: Secondary | ICD-10-CM | POA: Diagnosis not present

## 2017-09-29 DIAGNOSIS — Z0000 Encounter for general adult medical examination without abnormal findings: Secondary | ICD-10-CM | POA: Diagnosis not present

## 2017-09-29 DIAGNOSIS — F324 Major depressive disorder, single episode, in partial remission: Secondary | ICD-10-CM | POA: Diagnosis not present

## 2017-10-01 ENCOUNTER — Ambulatory Visit (INDEPENDENT_AMBULATORY_CARE_PROVIDER_SITE_OTHER): Payer: Medicare Other | Admitting: *Deleted

## 2017-10-01 DIAGNOSIS — I495 Sick sinus syndrome: Secondary | ICD-10-CM

## 2017-10-01 NOTE — Progress Notes (Signed)
Remote pacemaker transmission.   

## 2017-10-02 ENCOUNTER — Encounter: Payer: Self-pay | Admitting: Cardiology

## 2017-10-06 DIAGNOSIS — H2511 Age-related nuclear cataract, right eye: Secondary | ICD-10-CM | POA: Diagnosis not present

## 2017-10-08 DIAGNOSIS — I480 Paroxysmal atrial fibrillation: Secondary | ICD-10-CM | POA: Diagnosis not present

## 2017-10-08 DIAGNOSIS — Z125 Encounter for screening for malignant neoplasm of prostate: Secondary | ICD-10-CM | POA: Diagnosis not present

## 2017-10-13 DIAGNOSIS — H2511 Age-related nuclear cataract, right eye: Secondary | ICD-10-CM | POA: Diagnosis not present

## 2017-10-13 DIAGNOSIS — H25811 Combined forms of age-related cataract, right eye: Secondary | ICD-10-CM | POA: Diagnosis not present

## 2017-10-18 LAB — CUP PACEART REMOTE DEVICE CHECK
Battery Remaining Percentage: 71 %
Brady Statistic RA Percent Paced: 11 %
Brady Statistic RV Percent Paced: 85 %
Date Time Interrogation Session: 20190227055100
Implantable Lead Implant Date: 20140421
Implantable Lead Location: 753859
Lead Channel Impedance Value: 462 Ohm
Lead Channel Impedance Value: 703 Ohm
Lead Channel Pacing Threshold Pulse Width: 0.4 ms
Lead Channel Setting Pacing Amplitude: 1.4 V
Lead Channel Setting Pacing Pulse Width: 0.4 ms
MDC IDC LEAD IMPLANT DT: 20140421
MDC IDC LEAD LOCATION: 753860
MDC IDC MSMT BATTERY REMAINING LONGEVITY: 66 mo
MDC IDC MSMT LEADCHNL RA PACING THRESHOLD AMPLITUDE: 0.6 V
MDC IDC MSMT LEADCHNL RV PACING THRESHOLD AMPLITUDE: 1 V
MDC IDC MSMT LEADCHNL RV PACING THRESHOLD PULSEWIDTH: 0.4 ms
MDC IDC PG IMPLANT DT: 20140421
MDC IDC SET LEADCHNL RA PACING AMPLITUDE: 2 V
MDC IDC SET LEADCHNL RV SENSING SENSITIVITY: 2.5 mV
Pulse Gen Serial Number: 112305

## 2017-10-23 DIAGNOSIS — H2512 Age-related nuclear cataract, left eye: Secondary | ICD-10-CM | POA: Diagnosis not present

## 2017-10-29 ENCOUNTER — Encounter: Payer: Medicare Other | Admitting: Physician Assistant

## 2017-11-03 DIAGNOSIS — H2512 Age-related nuclear cataract, left eye: Secondary | ICD-10-CM | POA: Diagnosis not present

## 2017-11-03 DIAGNOSIS — H25812 Combined forms of age-related cataract, left eye: Secondary | ICD-10-CM | POA: Diagnosis not present

## 2017-11-05 DIAGNOSIS — D2262 Melanocytic nevi of left upper limb, including shoulder: Secondary | ICD-10-CM | POA: Diagnosis not present

## 2017-11-05 DIAGNOSIS — D2271 Melanocytic nevi of right lower limb, including hip: Secondary | ICD-10-CM | POA: Diagnosis not present

## 2017-11-05 DIAGNOSIS — Z85828 Personal history of other malignant neoplasm of skin: Secondary | ICD-10-CM | POA: Diagnosis not present

## 2017-11-05 DIAGNOSIS — Z808 Family history of malignant neoplasm of other organs or systems: Secondary | ICD-10-CM | POA: Diagnosis not present

## 2017-11-05 DIAGNOSIS — L821 Other seborrheic keratosis: Secondary | ICD-10-CM | POA: Diagnosis not present

## 2017-11-05 DIAGNOSIS — D225 Melanocytic nevi of trunk: Secondary | ICD-10-CM | POA: Diagnosis not present

## 2017-11-05 DIAGNOSIS — L111 Transient acantholytic dermatosis [Grover]: Secondary | ICD-10-CM | POA: Diagnosis not present

## 2017-11-09 ENCOUNTER — Other Ambulatory Visit: Payer: Self-pay | Admitting: Internal Medicine

## 2017-11-10 NOTE — Telephone Encounter (Signed)
Age 72 years Wt 97.6kg  09/03/2017 Saw Dr Caryl Comes 04/29/2017 04/29/2017 Hgb 16.0  Hct 48.0 04/29/2017 SrCr 0.91 CrCl 99.8 Refill done for Xarelto 20 mg daily as requested

## 2017-12-31 ENCOUNTER — Ambulatory Visit (INDEPENDENT_AMBULATORY_CARE_PROVIDER_SITE_OTHER): Payer: Medicare Other | Admitting: *Deleted

## 2017-12-31 DIAGNOSIS — I495 Sick sinus syndrome: Secondary | ICD-10-CM

## 2017-12-31 NOTE — Progress Notes (Signed)
Remote pacemaker transmission.   

## 2018-01-06 LAB — CUP PACEART REMOTE DEVICE CHECK
Battery Remaining Longevity: 60 mo
Brady Statistic RA Percent Paced: 6 %
Brady Statistic RV Percent Paced: 83 %
Date Time Interrogation Session: 20190529045100
Implantable Lead Implant Date: 20140421
Implantable Lead Implant Date: 20140421
Implantable Lead Location: 753859
Implantable Lead Location: 753860
Implantable Lead Model: 5076
Implantable Lead Model: 5076
Implantable Pulse Generator Implant Date: 20140421
Lead Channel Impedance Value: 559 Ohm
Lead Channel Pacing Threshold Amplitude: 0.6 V
Lead Channel Pacing Threshold Amplitude: 0.9 V
Lead Channel Setting Pacing Amplitude: 1.3 V
Lead Channel Setting Pacing Amplitude: 2 V
MDC IDC MSMT BATTERY REMAINING PERCENTAGE: 66 %
MDC IDC MSMT LEADCHNL RA PACING THRESHOLD PULSEWIDTH: 0.4 ms
MDC IDC MSMT LEADCHNL RV IMPEDANCE VALUE: 638 Ohm
MDC IDC MSMT LEADCHNL RV PACING THRESHOLD PULSEWIDTH: 0.4 ms
MDC IDC PG SERIAL: 112305
MDC IDC SET LEADCHNL RV PACING PULSEWIDTH: 0.4 ms
MDC IDC SET LEADCHNL RV SENSING SENSITIVITY: 2.5 mV

## 2018-03-02 ENCOUNTER — Other Ambulatory Visit: Payer: Self-pay | Admitting: Internal Medicine

## 2018-03-10 ENCOUNTER — Other Ambulatory Visit: Payer: Self-pay | Admitting: Internal Medicine

## 2018-04-01 ENCOUNTER — Ambulatory Visit (INDEPENDENT_AMBULATORY_CARE_PROVIDER_SITE_OTHER): Payer: Medicare Other | Admitting: *Deleted

## 2018-04-01 DIAGNOSIS — I495 Sick sinus syndrome: Secondary | ICD-10-CM

## 2018-04-01 NOTE — Progress Notes (Signed)
Remote pacemaker transmission.   

## 2018-04-02 ENCOUNTER — Encounter: Payer: Self-pay | Admitting: Internal Medicine

## 2018-04-18 ENCOUNTER — Other Ambulatory Visit: Payer: Self-pay | Admitting: Internal Medicine

## 2018-04-20 NOTE — Telephone Encounter (Signed)
Pt last saw Dr Caryl Comes 04/29/17, pt has 1 year f/u appt scheduled for 05/11/18.  Last labs 04/29/17 Creat 0.91, placed note on appt to repeat CBC and BMP at [place].  Age 72, weight 97.6kg, CrCl 98.32, based on CrCl pt is on appropriate dosage of Xarelto 20mg  QD.  Will refill rx.

## 2018-04-29 LAB — CUP PACEART REMOTE DEVICE CHECK
Implantable Lead Implant Date: 20140421
Implantable Lead Location: 753859
Implantable Lead Location: 753860
Implantable Lead Model: 5076
Implantable Pulse Generator Implant Date: 20140421
MDC IDC LEAD IMPLANT DT: 20140421
MDC IDC SESS DTM: 20190925102617
Pulse Gen Serial Number: 112305

## 2018-04-30 ENCOUNTER — Encounter: Payer: Medicare Other | Admitting: Internal Medicine

## 2018-05-11 ENCOUNTER — Ambulatory Visit (INDEPENDENT_AMBULATORY_CARE_PROVIDER_SITE_OTHER): Payer: Medicare Other | Admitting: Internal Medicine

## 2018-05-11 ENCOUNTER — Encounter: Payer: Self-pay | Admitting: Internal Medicine

## 2018-05-11 VITALS — BP 132/86 | HR 62 | Ht 72.0 in | Wt 206.8 lb

## 2018-05-11 DIAGNOSIS — I495 Sick sinus syndrome: Secondary | ICD-10-CM | POA: Diagnosis not present

## 2018-05-11 DIAGNOSIS — I480 Paroxysmal atrial fibrillation: Secondary | ICD-10-CM | POA: Diagnosis not present

## 2018-05-11 DIAGNOSIS — I4819 Other persistent atrial fibrillation: Secondary | ICD-10-CM

## 2018-05-11 DIAGNOSIS — Z950 Presence of cardiac pacemaker: Secondary | ICD-10-CM | POA: Diagnosis not present

## 2018-05-11 DIAGNOSIS — I440 Atrioventricular block, first degree: Secondary | ICD-10-CM

## 2018-05-11 NOTE — Progress Notes (Addendum)
Patient Care Team: [name], MD as PCP - General (Family Medicine)   HPI  [name] is a 72 y.o. 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    He continues to have some problems with edema-- he is exuberant with his salt intake and is stable on his current diuretic regime with metalazone used 1-2 month   some DOE esp with stairs   No real change in exercise status over the last year  AFib burden ( sinus 40% >> 9-17  15% >>9/18   4%>>9/19)      Date Cr K Hgb  2/17  1.08 3.7   8/17  0.93 4.1   9/18 0.91 4.9 16.0         DATE TEST    4/13    Echo   EF 55-65 %   3/14    Myoview   Images Personally reviewed  Normal         Thromboembolic risk profile is notable for a CHADS-VASc score of 3. He is on Rivaroxaban      Past Medical History:  Diagnosis Date  . 1st degree AV block   . AF (atrial fibrillation) ([place])    recent new occurrence 3/13>>Tikosyn  . Atrial flutter ([place])    s/p ablation 2006  . CAD (coronary artery disease)    Mild 2 vessel disease per cath in 2006; myoview neg 2011  . Chronic anticoagulation    on Xarelto - started 11/12/11  . Depression 11-05-11   On Cymbalta  . GERD (gastroesophageal reflux disease)   . Hyperlipidemia   . Hypertension   . Normal nuclear stress test 2011  . Pacemaker 11/23/2012   Dr Caryl Comes  . PVC's (premature ventricular contractions)   . Right ventricular enlargement    Mild noted echo 3/13  . Shortness of breath   . Tobacco abuse     Past Surgical History:  Procedure Laterality Date  . CARDIAC CATHETERIZATION  11/1988, 12/07/2004   showing essentially normal left  ventricular function, moderate two-vessel disease (50% LAD after the third diagonal and a 60+% stenosis in the second obtuse marginal)         . CARDIOVERSION N/A 12/05/2011   Procedure:  CARDIOVERSION;  Surgeon: Deboraha Sprang, MD;  Location: [facility] CATH LAB;  Service: Cardiovascular;  Laterality: N/A;  . FOOT SURGERY  1996   left-retained hardware  . HERNIA REPAIR    . INGUINAL HERNIA REPAIR  01/28/2008   Recurrent right inguinal hernia Laparoscopic, preperitoneal repair of recurrent right inguinal hernia with mesh -- SURGEON:  Dr. Fanny Skates.  . INGUINAL HERNIA REPAIR  11/06/2011   Procedure: HERNIA REPAIR INGUINAL ADULT;  Surgeon: Adin Hector, MD;  Location: [facility] ORS;  Service: General;  Laterality: Left;  . PACEMAKER INSERTION  11/23/2012   Dr Caryl Comes  . PERMANENT PACEMAKER INSERTION N/A 11/23/2012   Procedure: PERMANENT PACEMAKER INSERTION;  Surgeon: Deboraha Sprang, MD;  Location: [facility] CATH LAB;  Service: Cardiovascular;  Laterality: N/A;  . TEE WITH CARDIOVERSION  2001, 2006   Invasive electrophysiology study, electroanatomical mapping,and radiofrequency catheter ablation. -- patient's arrhythmia was isthmus dependent flutter not withstanding the very long cycle length.  RF energy delivered across the cavotricuspid isthmus successfully interrupted conduction across the isthmus and eliminated the patient's sub-straight. PHYSICIAN:  Deboraha Sprang, M.D  .  TEE WITHOUT CARDIOVERSION  12/31/2011   Procedure: TRANSESOPHAGEAL ECHOCARDIOGRAM (TEE);  Surgeon: Josue Hector, MD;  Location: [facility] ENDOSCOPY;  Service: Cardiovascular;  Laterality: N/A;  tba after for tikosyn  . VASECTOMY      Current Outpatient Medications  Medication Sig Dispense Refill  . cetirizine (ZYRTEC) 10 MG tablet Take 10 mg by mouth daily.    [name] dofetilide (TIKOSYN) 500 MCG capsule Take 1 capsule (500 mcg total) by mouth 2 (two) times daily. Please keep upcoming appt in September for future refills. Thank you 180 capsule 0  . fluticasone (FLONASE) 50 MCG/ACT nasal spray Place into both nostrils daily.    . furosemide (LASIX) 80 MG tablet Take 120 mg by mouth daily.    . metolazone (ZAROXOLYN) 2.5 MG tablet Take 1  tablet (2.5 mg total) by mouth once a week. Please keep upcoming appt with Dr. Caryl Comes in September before anymore refills. Thank you 5 tablet 1  . naproxen sodium (ALEVE) 220 MG tablet Take 220 mg by mouth daily as needed.    [name] OVER THE COUNTER MEDICATION Take 20 drops by mouth daily.    [name] OVER THE COUNTER MEDICATION Take 1 tablet by mouth daily.    [name] OVER THE COUNTER MEDICATION Take 1 Scoop by mouth daily.    . rivaroxaban (XARELTO) 20 MG TABS tablet Take 20 mg by mouth daily with supper.    [name] VIAGRA 100 MG tablet Take 1 tablet by mouth as needed for erectile dysfunction.     No current facility-administered medications for this visit.     Allergies  Allergen Reactions  . Horse-Derived Products Hives    Review of Systems negative except from HPI and PMH  Physical Exam BP 132/86   Pulse 62   Ht 6' (1.829 m)   Wt 206 lb 12.8 oz (93.8 kg)   BMI 28.05 kg/m  Well developed and nourished in no acute distress HENT normal Neck supple with JVP-flat Carotids brisk and full without bruits Clear  regular rate and rhythm with controlled ventricular response, no murmurs or gallops Abd-soft with active BS without hepatomegaly No Clubbing cyanosis edema Skin-warm and dry A & Oriented  Grossly normal sensory and motor function   ECG demonstrates afib and VPacing   Assessment and  Plan  Sinus node dysfunction  Atrial  Fibrillation on tikosyn  Pacemaker  BSx  Hypertension  1AVB profound/ IVCD  HFpEF  Chest pain    Afib burden increasing  Have discussed treatment strategies including amiodarone catheter ablation to pursue either, we would need to demonstrate that rhythm control would be beneficial.  We will consider cardioversion following his echo.  We will undertake echocardiogram to look at left atrial dimensions trying to predict the likelihood of maintaining sinus rhythm and given his symptoms of worsening exercise tolerance, we need to assess left ventricular function to  see whether he has developed pacemaker induced cardiomyopathy.  No interval chest pain    We spent more than 50% of our >25 min visit in face to face counseling regarding the above

## 2018-05-11 NOTE — Patient Instructions (Signed)
Medication Instructions:  Your physician recommends that you continue on your current medications as directed. Please refer to the Current Medication list given to you today. If you need a refill on your cardiac medications before your next appointment, please call your pharmacy.   Lab work: You had labs drawn today: CBC, BMP, and Mg  If you have labs (blood work) drawn today and your tests are completely normal, you will receive your results only by: [name] MyChart Message (if you have MyChart) OR . A paper copy in the mail If you have any lab test that is abnormal or we need to change your treatment, we will call you to review the results.  Testing/Procedures: Your physician has requested that you have an echocardiogram. Echocardiography is a painless test that uses sound waves to create images of your heart. It provides your doctor with information about the size and shape of your heart and how well your heart's chambers and valves are working. This procedure takes approximately one hour. There are no restrictions for this procedure.  Follow-Up: At [facility], you and your health needs are our priority.  As part of our continuing mission to provide you with exceptional heart care, we have created designated Provider Care Teams.  These Care Teams include your primary Cardiologist (physician) and Advanced Practice Providers (APPs -  Physician Assistants and Nurse Practitioners) who all work together to provide you with the care you need, when you need it. You will need a follow up appointment in 12 months with Dr Caryl Comes.  Please call our office 2 months in advance to schedule this appointment.     Special Instructions Will Be Listed Below (If Applicable).

## 2018-05-12 LAB — BASIC METABOLIC PANEL
BUN/Creatinine Ratio: 23 (ref 10–24)
BUN: 22 mg/dL (ref 8–27)
CALCIUM: 9.5 mg/dL (ref 8.6–10.2)
CHLORIDE: 95 mmol/L — AB (ref 96–106)
CO2: 30 mmol/L — ABNORMAL HIGH (ref 20–29)
Creatinine, Ser: 0.95 mg/dL (ref 0.76–1.27)
GFR calc Af Amer: 91 mL/min/{1.73_m2} (ref >59)
GFR calc non Af Amer: 79 mL/min/{1.73_m2} (ref >59)
Glucose: 95 mg/dL (ref 65–99)
POTASSIUM: 4 mmol/L (ref 3.5–5.2)
SODIUM: 141 mmol/L (ref 134–144)

## 2018-05-12 LAB — CBC
HEMOGLOBIN: 16.1 g/dL (ref 13.0–17.7)
Hematocrit: 45.7 % (ref 37.5–51.0)
MCH: 33.8 pg — ABNORMAL HIGH (ref 26.6–33.0)
MCHC: 35.2 g/dL (ref 31.5–35.7)
MCV: 96 fL (ref 79–97)
PLATELETS: 208 10*3/uL (ref 150–450)
RBC: 4.77 x10E6/uL (ref 4.14–5.80)
RDW: 12.5 % (ref 12.3–15.4)
WBC: 5.7 10*3/uL (ref 3.4–10.8)

## 2018-05-12 LAB — MAGNESIUM: MAGNESIUM: 2.3 mg/dL (ref 1.6–2.3)

## 2018-05-15 ENCOUNTER — Other Ambulatory Visit: Payer: Self-pay

## 2018-05-15 ENCOUNTER — Ambulatory Visit (HOSPITAL_COMMUNITY): Payer: Medicare Other | Attending: Cardiology

## 2018-05-15 DIAGNOSIS — I440 Atrioventricular block, first degree: Secondary | ICD-10-CM | POA: Diagnosis not present

## 2018-05-15 DIAGNOSIS — Z950 Presence of cardiac pacemaker: Secondary | ICD-10-CM | POA: Diagnosis not present

## 2018-05-15 DIAGNOSIS — I495 Sick sinus syndrome: Secondary | ICD-10-CM | POA: Insufficient documentation

## 2018-05-15 DIAGNOSIS — I480 Paroxysmal atrial fibrillation: Secondary | ICD-10-CM | POA: Diagnosis not present

## 2018-05-20 MED ORDER — FUROSEMIDE 40 MG PO TABS: 120.0000 mg | ORAL_TABLET | Freq: Every day | ORAL | 3 refills | Status: DC

## 2018-06-01 ENCOUNTER — Telehealth: Payer: Self-pay

## 2018-06-01 NOTE — Telephone Encounter (Signed)
Spoke with pt regarding Dr Olin Pia recommendation's on managing his Afib. Pt would like to discuss potential ablation with Dr Rayann Heman. A message will be sent to scheduling to set up. Pt verbalized understanding and had no additional questions.

## 2018-06-01 NOTE — Telephone Encounter (Signed)
-----   Message from Deboraha Sprang, MD sent at 05/27/2018  5:46 PM EDT ----- L  Can you let hm know that his atrial size is sufficiently small to considedr trying to get him back into sinus   Can either try amio or refer for ablation   If would like to discuss more, can either come in to talk to me, amber of [place]  thnks

## 2018-06-04 ENCOUNTER — Telehealth: Payer: Self-pay | Admitting: *Deleted

## 2018-06-04 NOTE — Telephone Encounter (Signed)
Followed-up with patient regarding his questions about PPM battery longevity from [place] on 05/11/18. Per [employer], battery trends are appropriate for his programming (DDIR with high burden/persistent AF). Patient verbalizes understanding and denies additional questions at this time.

## 2018-06-08 ENCOUNTER — Telehealth: Payer: Self-pay | Admitting: Internal Medicine

## 2018-06-08 NOTE — Telephone Encounter (Signed)
Pt needed to reschedule his remote appointment d/t being out of town.

## 2018-06-08 NOTE — Telephone Encounter (Signed)
°  1. Has your device fired? no 2. Is you device beeping? no  3. Are you experiencing draining or swelling at device site? no 4. Are you calling to see if we received your device transmission? no  5. Have you passed out? No  Patient calling to reschedule his appointment.     Please route to [place]

## 2018-06-11 LAB — CUP PACEART INCLINIC DEVICE CHECK
Brady Statistic RA Percent Paced: 4 %
Date Time Interrogation Session: 20191007040000
Implantable Lead Implant Date: 20140421
Implantable Lead Location: 753860
Implantable Lead Model: 5076
Lead Channel Impedance Value: 594 Ohm
Lead Channel Pacing Threshold Amplitude: 0.9 V
Lead Channel Pacing Threshold Pulse Width: 0.4 ms
Lead Channel Setting Pacing Amplitude: 2 V
MDC IDC LEAD IMPLANT DT: 20140421
MDC IDC LEAD LOCATION: 753859
MDC IDC MSMT LEADCHNL RA SENSING INTR AMPL: 1.8 mV
MDC IDC MSMT LEADCHNL RV IMPEDANCE VALUE: 698 Ohm
MDC IDC MSMT LEADCHNL RV SENSING INTR AMPL: 11.5 mV
MDC IDC PG IMPLANT DT: 20140421
MDC IDC SET LEADCHNL RV PACING AMPLITUDE: 1.5 V
MDC IDC SET LEADCHNL RV PACING PULSEWIDTH: 0.4 ms
MDC IDC SET LEADCHNL RV SENSING SENSITIVITY: 2.5 mV
MDC IDC STAT BRADY RV PERCENT PACED: 81 %
Pulse Gen Serial Number: 112305

## 2018-06-12 ENCOUNTER — Other Ambulatory Visit: Payer: Self-pay | Admitting: Internal Medicine

## 2018-06-12 ENCOUNTER — Encounter: Payer: Self-pay | Admitting: Internal Medicine

## 2018-06-12 ENCOUNTER — Ambulatory Visit (INDEPENDENT_AMBULATORY_CARE_PROVIDER_SITE_OTHER): Payer: Medicare Other | Admitting: Internal Medicine

## 2018-06-12 VITALS — BP 138/84 | HR 61 | Ht 72.0 in | Wt 203.6 lb

## 2018-06-12 DIAGNOSIS — Z01812 Encounter for preprocedural laboratory examination: Secondary | ICD-10-CM

## 2018-06-12 DIAGNOSIS — I4819 Other persistent atrial fibrillation: Secondary | ICD-10-CM | POA: Diagnosis not present

## 2018-06-12 NOTE — Patient Instructions (Addendum)
Medication Instructions:  Your physician recommends that you continue on your current medications as directed. Please refer to the Current Medication list given to you today.  If you need a refill on your cardiac medications before your next appointment, please call your pharmacy.   Lab work: Your physician recommends that you return for pre procedure lab work between 11/18 - 11/27 for BMP & CBC If you have labs (blood work) drawn today and your tests are completely normal, you will receive your results only by: [name] MyChart Message (if you have MyChart) OR . A paper copy in the mail If you have any lab test that is abnormal or we need to change your treatment, we will call you to review the results.  Testing/Procedures: Your physician has requested that you have a TEE before your ablation on 12/3. During a TEE, sound waves are used to create images of your heart. It provides your doctor with information about the size and shape of your heart and how well your heart's chambers and valves are working. In this test, a transducer is attached to the end of a flexible tube that's guided down your throat and into your esophagus (the tube leading from you mouth to your stomach) to get a more detailed image of your heart. You are not awake for the procedure. This will scheduled 1-2 hours prior to your ablation. Review ablation instructions below on when to arrive at the hospital for both procedures.  Your physician has recommended that you have an ablation. Catheter ablation is a medical procedure used to treat some cardiac arrhythmias (irregular heartbeats). During catheter ablation, a long, thin, flexible tube is put into a blood vessel in your groin (upper thigh), or neck. This tube is called an ablation catheter. It is then guided to your heart through the blood vessel. Radio frequency waves destroy small areas of heart tissue where abnormal heartbeats may cause an arrhythmia to start.  Instructions for  your ablation: 1. Please arrive at the [facility], Main Entrance "A", of [facility] at 7:00 am on 07/07/2018.  2. Do not eat or drink after midnight the night prior to the procedure. 3. Do not miss any doses of XARELTO prior to the morning of the procedure.  4. Do not take any medications the morning of the procedure. 5. Both of your groins will need to be shaved for this procedure (if needed). We ask that you do this yourself at home 1-2 days prior to the procedure.  If you are unable/uncomfortable to do yourself, the hospital staff will shave you the day of your procedure (if needed). 6. Plan for an overnight stay in the hospital. 7. You will need someone to drive you home at discharge.  Follow-Up: Remote monitoring is used to monitor your Pacemaker of ICD from home. This monitoring reduces the number of office visits required to check your device to one time per year. It allows [place] to keep an eye on the functioning of your device to ensure it is working properly. You are scheduled for a device check from home on 07/06/2018. You may send your transmission at any time that day. If you have a wireless device, the transmission will be sent automatically. After your physician reviews your transmission, you will receive a postcard with your next transmission date.  Your physician recommends that you schedule a follow-up appointment in: 4 weeks, after your procedure on 07/07/2018, with Roderic Palau in the AFib clinic.   Your physician recommends  that you schedule a follow-up appointment in: 3 months, after your procedure on 07/07/2018, with Dr. Rayann Heman.  Thank you for choosing [facility]!!    Any Other Special Instructions Will Be Listed Below (If Applicable).  Cardiac Ablation Cardiac ablation is a procedure to disable (ablate) a small amount of heart tissue in very specific places. The heart has many electrical connections. Sometimes these connections are abnormal and can cause the  heart to beat very fast or irregularly. Ablating some of the problem areas can improve the heart rhythm or return it to normal. Ablation may be done for people who:  Have Wolff-Parkinson-White syndrome.  Have fast heart rhythms (tachycardia).  Have taken medicines for an abnormal heart rhythm (arrhythmia) that were not effective or caused side effects.  Have a high-risk heartbeat that may be life-threatening.  During the procedure, a small incision is made in the neck or the groin, and a long, thin, flexible tube (catheter) is inserted into the incision and moved to the heart. Small devices (electrodes) on the tip of the catheter will send out electrical currents. A type of X-ray (fluoroscopy) will be used to help guide the catheter and to provide images of the heart. Tell a health care provider about:  Any allergies you have.  All medicines you are taking, including vitamins, herbs, eye drops, creams, and over-the-counter medicines.  Any problems you or family members have had with anesthetic medicines.  Any blood disorders you have.  Any surgeries you have had.  Any medical conditions you have, such as kidney failure.  Whether you are pregnant or may be pregnant. What are the risks? Generally, this is a safe procedure. However, problems may occur, including:  Infection.  Bruising and bleeding at the catheter insertion site.  Bleeding into the chest, especially into the sac that surrounds the heart. This is a serious complication.  Stroke or blood clots.  Damage to other structures or organs.  Allergic reaction to medicines or dyes.  Need for a permanent pacemaker if the normal electrical system is damaged. A pacemaker is a small computer that sends electrical signals to the heart and helps your heart beat normally.  The procedure not being fully effective. This may not be recognized until months later. Repeat ablation procedures are sometimes required.  What happens  before the procedure?  Follow instructions from your health care provider about eating or drinking restrictions.  Ask your health care provider about: ? Changing or stopping your regular medicines. This is especially important if you are taking diabetes medicines or blood thinners. ? Taking medicines such as aspirin and ibuprofen. These medicines can thin your blood. Do not take these medicines before your procedure if your health care provider instructs you not to.  Plan to have someone take you home from the hospital or clinic.  If you will be going home right after the procedure, plan to have someone with you for 24 hours. What happens during the procedure?  To lower your risk of infection: ? Your health care team will wash or sanitize their hands. ? Your skin will be washed with soap. ? Hair may be removed from the incision area.  An IV tube will be inserted into one of your veins.  You will be given a medicine to help you relax (sedative).  The skin on your neck or groin will be numbed.  An incision will be made in your neck or your groin.  A needle will be inserted through the incision and  into a large vein in your neck or groin.  A catheter will be inserted into the needle and moved to your heart.  Dye may be injected through the catheter to help your surgeon see the area of the heart that needs treatment.  Electrical currents will be sent from the catheter to ablate heart tissue in desired areas. There are three types of energy that may be used to ablate heart tissue: ? Heat (radiofrequency energy). ? Laser energy. ? Extreme cold (cryoablation).  When the necessary tissue has been ablated, the catheter will be removed.  Pressure will be held on the catheter insertion area to prevent excessive bleeding.  A bandage (dressing) will be placed over the catheter insertion area. The procedure may vary among health care providers and hospitals. What happens after the  procedure?  Your blood pressure, heart rate, breathing rate, and blood oxygen level will be monitored until the medicines you were given have worn off.  Your catheter insertion area will be monitored for bleeding. You will need to lie still for a few hours to ensure that you do not bleed from the catheter insertion area.  Do not drive for 24 hours or as long as directed by your health care provider. Summary  Cardiac ablation is a procedure to disable (ablate) a small amount of heart tissue in very specific places. Ablating some of the problem areas can improve the heart rhythm or return it to normal.  During the procedure, electrical currents will be sent from the catheter to ablate heart tissue in desired areas. This information is not intended to replace advice given to you by your health care provider. Make sure you discuss any questions you have with your health care provider. Document Released: 12/08/2008 Document Revised: 06/10/2016 Document Reviewed: 06/10/2016 Elsevier Interactive Patient Education  [employer].

## 2018-06-12 NOTE — Progress Notes (Signed)
Electrophysiology Office Note   Date:  06/12/2018   ID:  [name], DOB 1946-06-02, MRN [id number]  PCP:  [name], MD    Primary Electrophysiologist: Dr Caryl Comes  CC: afib   History of Present Illness: [name] is a 72 y.o. male who presents today for electrophysiology evaluation.   He presents today for further evaluation of afib.  He reports having atrail fibrillation since at least 2013.  He has been treated with tikosyn since that time.  He is also s/p PPM implant for sick sinus syndrome 2014.   He has had increasing frequency and duration of atrial fibrillation. He reports fatigue and decreased exercise tolerance during afib.  He is now in AF all of the time.  Today, he denies symptoms of  chest pain, shortness of breath, orthopnea, PND, lower extremity edema, claudication, dizziness, presyncope, syncope, bleeding, or neurologic sequela. The patient is tolerating medications without difficulties and is otherwise without complaint today.    Past Medical History:  Diagnosis Date  . 1st degree AV block   . Atrial flutter ([place])    s/p ablation 2006  . CAD (coronary artery disease)    Mild 2 vessel disease per cath in 2006; myoview neg 2011  . Chronic anticoagulation    on Xarelto - started 11/12/11  . Depression 11-05-11   On Cymbalta  . GERD (gastroesophageal reflux disease)   . Hyperlipidemia   . Hypertension   . Normal nuclear stress test 2011  . Persistent atrial fibrillation    recent new occurrence 3/13>>Tikosyn  . PVC's (premature ventricular contractions)   . Right ventricular enlargement    Mild noted echo 3/13  . Sick sinus syndrome ([place]) 11/23/2012   Dr Caryl Comes  . Tobacco abuse    Past Surgical History:  Procedure Laterality Date  . CARDIAC CATHETERIZATION  11/1988, 12/07/2004   showing essentially normal left  ventricular function, moderate two-vessel disease (50% LAD after the third diagonal and a 60+% stenosis in the second obtuse marginal)          . CARDIOVERSION N/A 12/05/2011   Procedure: CARDIOVERSION;  Surgeon: Deboraha Sprang, MD;  [facility] C[facility] LAB;  Service: Cardiovascular;  Laterality: N/A;  . FOOT SURGERY  1996   left-retained hardware  . HERNIA REPAIR    . INGUINAL HERNIA REPAIR  01/28/2008   Recurrent right inguinal hernia Laparoscopic, preperitoneal repair of recurrent right inguinal hernia with mesh -- SURGEON:  Dr. Fanny Skates.  . INGUINAL HERNIA REPAIR  11/06/2011   Procedure: HERNIA REPAIR INGUINAL ADULT;  Surgeon: Adin Hector, MD;  Location: [facility] ORS;  Service: General;  Laterality: Left;  . PACEMAKER INSERTION  11/23/2012   Dr Caryl Comes  . PERMANENT PACEMAKER INSERTION N/A 11/23/2012   Procedure: PERMANENT PACEMAKER INSERTION;  Surgeon: Deboraha Sprang, MD;  Location: [facility] CATH LAB;  Service: Cardiovascular;  Laterality: N/A;  . TEE WITH CARDIOVERSION  2001, 2006   Invasive electrophysiology study, electroanatomical mapping,and radiofrequency catheter ablation. -- patient's arrhythmia was isthmus dependent flutter not withstanding the very long cycle length.  RF energy delivered across the cavotricuspid isthmus successfully interrupted conduction across the isthmus and eliminated the patient's sub-straight. PHYSICIAN:  Deboraha Sprang, M.D  . TEE WITHOUT CARDIOVERSION  12/31/2011   Procedure: TRANSESOPHAGEAL ECHOCARDIOGRAM (TEE);  Surgeon: Josue Hector, MD;  Location: [facility] ENDOSCOPY;  Service: Cardiovascular;  Laterality: N/A;  tba after for tikosyn  . VASECTOMY       Current Outpatient Medications  Medication  Sig Dispense Refill  . cetirizine (ZYRTEC) 10 MG tablet Take 10 mg by mouth daily.    [name] dofetilide (TIKOSYN) 500 MCG capsule Take 1 capsule (500 mcg total) by mouth 2 (two) times daily. Please keep upcoming appt in September for future refills. Thank you 180 capsule 0  . fluticasone (FLONASE) 50 MCG/ACT nasal spray Place into both nostrils daily.    . furosemide (LASIX) 40 MG tablet Take 3 tablets (120 mg total)  by mouth daily. 270 tablet 3  . metolazone (ZAROXOLYN) 2.5 MG tablet Take 1 tablet (2.5 mg total) by mouth once a week. Please keep upcoming appt with Dr. Caryl Comes in September before anymore refills. Thank you 5 tablet 1  . naproxen sodium (ALEVE) 220 MG tablet Take 220 mg by mouth daily as needed.    [name] OVER THE COUNTER MEDICATION Take 20 drops by mouth daily.    [name] OVER THE COUNTER MEDICATION Take 1 tablet by mouth daily.    [name] OVER THE COUNTER MEDICATION Take 1 Scoop by mouth daily.    . rivaroxaban (XARELTO) 20 MG TABS tablet Take 20 mg by mouth daily with supper.    [name] VIAGRA 100 MG tablet Take 1 tablet by mouth as needed for erectile dysfunction.     No current facility-administered medications for this visit.     Allergies:   Horse-derived products   Social History:  The patient  reports that he quit smoking about 51 years ago. His smoking use included cigarettes. He quit after 4.00 years of use. He has never used smokeless tobacco. He reports that he drinks about 2.0 standard drinks of alcohol per week. He reports that he does not use drugs.   Family History:  The patient's  family history includes Atrial fibrillation in his mother; Cancer in his father, paternal uncle, and sister; Coronary artery disease in his father; Heart attack (age of onset: 51) in his father; Heart failure (age of onset: 14) in his mother; Kidney disease in his father.    ROS:  Please see the history of present illness.   All other systems are personally reviewed and negative.    PHYSICAL EXAM: VS:  BP 138/84   Pulse 61   Ht 6' (1.829 m)   Wt 203 lb 9.6 oz (92.4 kg)   SpO2 97%   BMI 27.61 kg/m  , BMI Body mass index is 27.61 kg/m. GEN: Well nourished, well developed, in no acute distress  HEENT: normal  Neck: no JVD, carotid bruits, or masses Cardiac: RRR (paced) Respiratory:  clear to auscultation bilaterally, normal work of breathing GI: soft, nontender, nondistended, + BS MS: no deformity or atrophy    Skin: warm and dry  Neuro:  Strength and sensation are intact Psych: euthymic mood, full affect  EKG:  EKG is ordered today. The ekg ordered today is personally reviewed and shows afib with V pacing   Recent Labs: 05/11/2018: BUN 22; Creatinine, Ser 0.95; Hemoglobin 16.1; Magnesium 2.3; Platelets 208; Potassium 4.0; Sodium 141  personally reviewed   Lipid Panel     Component Value Date/Time   CHOL 212 (H) 10/26/2012 0901   TRIG 66.0 10/26/2012 0901   HDL 65.50 10/26/2012 0901   CHOLHDL 3 10/26/2012 0901   VLDL 13.2 10/26/2012 0901   LDLDIRECT 140.2 10/26/2012 0901   personally reviewed   Wt Readings from Last 3 Encounters:  06/12/18 203 lb 9.6 oz (92.4 kg)  05/11/18 206 lb 12.8 oz (93.8 kg)  09/03/17 215 lb 3.2  oz (97.6 kg)      Other studies personally reviewed: Additional studies/ records that were reviewed today include: Dr Aquilla Hacker notes  Review of the above records today demonstrates: EF 50-55%, mildly enlarged L atrium.  Moderate RA enlargement, mild MR  Pacemaker interrogation is personally reviewed (see paceart) He is now programmed DDIR with is [employer] device and appears to be in AF all of the time.  ASSESSMENT AND PLAN:  1.  Persistent atrial fibrillation Remote history of atrial flutter (CTI) ablation The patient has symptomatic, recurrent atrial fibrillation. he has failed medical therapy with tikosyn. Chads2vasc score is 3.  he is anticoagulated with xarelto. Therapeutic strategies for afib including medicine and ablation were discussed in detail with the patient today. Risk, benefits, and alternatives to EP study and radiofrequency ablation for afib were also discussed in detail today. These risks include but are not limited to stroke, bleeding, vascular damage, tamponade, perforation, damage to the esophagus, lungs, and other structures, pulmonary vein stenosis, worsening renal function, and death. The patient understands these risk and wishes to proceed.   We will therefore proceed with catheter ablation at the next available time.  Carto, ICE, anesthesia are requested for the procedure.  Will also obtain TEE prior to the procedure to exclude LAA thrombus and further evaluate atrial anatomy.   Current medicines are reviewed at length with the patient today.   The patient does not have concerns regarding his medicines.  The following changes were made today:  none   Signed, [name], MD  06/12/2018 4:00 PM     [address] [place] [address] [phone] (office) [phone] (fax)

## 2018-06-12 NOTE — H&P (View-Only) (Signed)
Electrophysiology Office Note   Date:  06/12/2018   ID:  [name], DOB [birth year], MRN [id number]  PCP:  [name], MD    Primary Electrophysiologist: Dr Caryl Comes  CC: afib   History of Present Illness: [name] is a 72 y.o. male who presents today for electrophysiology evaluation.   He presents today for further evaluation of afib.  He reports having atrail fibrillation since at least 2013.  He has been treated with tikosyn since that time.  He is also s/p PPM implant for sick sinus syndrome 2014.   He has had increasing frequency and duration of atrial fibrillation. He reports fatigue and decreased exercise tolerance during afib.  He is now in AF all of the time.  Today, he denies symptoms of  chest pain, shortness of breath, orthopnea, PND, lower extremity edema, claudication, dizziness, presyncope, syncope, bleeding, or neurologic sequela. The patient is tolerating medications without difficulties and is otherwise without complaint today.    Past Medical History:  Diagnosis Date  . 1st degree AV block   . Atrial flutter ([place])    s/p ablation 2006  . CAD (coronary artery disease)    Mild 2 vessel disease per cath in 2006; myoview neg 2011  . Chronic anticoagulation    on Xarelto - started 11/12/11  . Depression 11-05-11   On Cymbalta  . GERD (gastroesophageal reflux disease)   . Hyperlipidemia   . Hypertension   . Normal nuclear stress test 2011  . Persistent atrial fibrillation    recent new occurrence 3/13>>Tikosyn  . PVC's (premature ventricular contractions)   . Right ventricular enlargement    Mild noted echo 3/13  . Sick sinus syndrome ([place]) 11/23/2012   Dr Caryl Comes  . Tobacco abuse    Past Surgical History:  Procedure Laterality Date  . CARDIAC CATHETERIZATION  11/1988, 12/07/2004   showing essentially normal left  ventricular function, moderate two-vessel disease (50% LAD after the third diagonal and a 60+% stenosis in the second obtuse marginal)          . CARDIOVERSION N/A 12/05/2011   Procedure: CARDIOVERSION;  Surgeon: Deboraha Sprang, MD;  Location: [facility] CATH LAB;  Service: Cardiovascular;  Laterality: N/A;  . FOOT SURGERY  1996   left-retained hardware  . HERNIA REPAIR    . INGUINAL HERNIA REPAIR  01/28/2008   Recurrent right inguinal hernia Laparoscopic, preperitoneal repair of recurrent right inguinal hernia with mesh -- SURGEON:  Dr. Fanny Skates.  . INGUINAL HERNIA REPAIR  11/06/2011   Procedure: HERNIA REPAIR INGUINAL ADULT;  Surgeon: Adin Hector, MD;  Location: [facility] ORS;  Service: General;  Laterality: Left;  . PACEMAKER INSERTION  11/23/2012   Dr Caryl Comes  . PERMANENT PACEMAKER INSERTION N/A 11/23/2012   Procedure: PERMANENT PACEMAKER INSERTION;  Surgeon: Deboraha Sprang, MD;  Location: [facility] CATH LAB;  Service: Cardiovascular;  Laterality: N/A;  . TEE WITH CARDIOVERSION  2001, 2006   Invasive electrophysiology study, electroanatomical mapping,and radiofrequency catheter ablation. -- patient's arrhythmia was isthmus dependent flutter not withstanding the very long cycle length.  RF energy delivered across the cavotricuspid isthmus successfully interrupted conduction across the isthmus and eliminated the patient's sub-straight. PHYSICIAN:  Deboraha Sprang, M.D  . TEE WITHOUT CARDIOVERSION  12/31/2011   Procedure: TRANSESOPHAGEAL ECHOCARDIOGRAM (TEE);  Surgeon: Josue Hector, MD;  Location: [facility] ENDOSCOPY;  Service: Cardiovascular;  Laterality: N/A;  tba after for tikosyn  . VASECTOMY       Current Outpatient Medications  Medication  Sig Dispense Refill  . cetirizine (ZYRTEC) 10 MG tablet Take 10 mg by mouth daily.    [name] dofetilide (TIKOSYN) 500 MCG capsule Take 1 capsule (500 mcg total) by mouth 2 (two) times daily. Please keep upcoming appt in September for future refills. Thank you 180 capsule 0  . fluticasone (FLONASE) 50 MCG/ACT nasal spray Place into both nostrils daily.    . furosemide (LASIX) 40 MG tablet Take 3 tablets (120 mg total)  by mouth daily. 270 tablet 3  . metolazone (ZAROXOLYN) 2.5 MG tablet Take 1 tablet (2.5 mg total) by mouth once a week. Please keep upcoming appt with Dr. Caryl Comes in September before anymore refills. Thank you 5 tablet 1  . naproxen sodium (ALEVE) 220 MG tablet Take 220 mg by mouth daily as needed.    [name] OVER THE COUNTER MEDICATION Take 20 drops by mouth daily.    [name] OVER THE COUNTER MEDICATION Take 1 tablet by mouth daily.    [name] OVER THE COUNTER MEDICATION Take 1 Scoop by mouth daily.    . rivaroxaban (XARELTO) 20 MG TABS tablet Take 20 mg by mouth daily with supper.    [name] VIAGRA 100 MG tablet Take 1 tablet by mouth as needed for erectile dysfunction.     No current facility-administered medications for this visit.     Allergies:   Horse-derived products   Social History:  The patient  reports that he quit smoking about 51 years ago. His smoking use included cigarettes. He quit after 4.00 years of use. He has never used smokeless tobacco. He reports that he drinks about 2.0 standard drinks of alcohol per week. He reports that he does not use drugs.   Family History:  The patient's  family history includes Atrial fibrillation in his mother; Cancer in his father, paternal uncle, and sister; Coronary artery disease in his father; Heart attack (age of onset: 43) in his father; Heart failure (age of onset: 45) in his mother; Kidney disease in his father.    ROS:  Please see the history of present illness.   All other systems are personally reviewed and negative.    PHYSICAL EXAM: VS:  BP 138/84   Pulse 61   Ht 6' (1.829 m)   Wt 203 lb 9.6 oz (92.4 kg)   SpO2 97%   BMI 27.61 kg/m  , BMI Body mass index is 27.61 kg/m. GEN: Well nourished, well developed, in no acute distress  HEENT: normal  Neck: no JVD, carotid bruits, or masses Cardiac: RRR (paced) Respiratory:  clear to auscultation bilaterally, normal work of breathing GI: soft, nontender, nondistended, + BS MS: no deformity or atrophy    Skin: warm and dry  Neuro:  Strength and sensation are intact Psych: euthymic mood, full affect  EKG:  EKG is ordered today. The ekg ordered today is personally reviewed and shows afib with V pacing   Recent Labs: 05/11/2018: BUN 22; Creatinine, Ser 0.95; Hemoglobin 16.1; Magnesium 2.3; Platelets 208; Potassium 4.0; Sodium 141  personally reviewed   Lipid Panel     Component Value Date/Time   CHOL 212 (H) 10/26/2012 0901   TRIG 66.0 10/26/2012 0901   HDL 65.50 10/26/2012 0901   CHOLHDL 3 10/26/2012 0901   VLDL 13.2 10/26/2012 0901   LDLDIRECT 140.2 10/26/2012 0901   personally reviewed   Wt Readings from Last 3 Encounters:  06/12/18 203 lb 9.6 oz (92.4 kg)  05/11/18 206 lb 12.8 oz (93.8 kg)  09/03/17 215 lb 3.2  oz (97.6 kg)      Other studies personally reviewed: Additional studies/ records that were reviewed today include: Dr Aquilla Hacker notes  Review of the above records today demonstrates: EF 50-55%, mildly enlarged L atrium.  Moderate RA enlargement, mild MR  Pacemaker interrogation is personally reviewed (see paceart) He is now programmed DDIR with is [employer] device and appears to be in AF all of the time.  ASSESSMENT AND PLAN:  1.  Persistent atrial fibrillation Remote history of atrial flutter (CTI) ablation The patient has symptomatic, recurrent atrial fibrillation. he has failed medical therapy with tikosyn. Chads2vasc score is 3.  he is anticoagulated with xarelto. Therapeutic strategies for afib including medicine and ablation were discussed in detail with the patient today. Risk, benefits, and alternatives to EP study and radiofrequency ablation for afib were also discussed in detail today. These risks include but are not limited to stroke, bleeding, vascular damage, tamponade, perforation, damage to the esophagus, lungs, and other structures, pulmonary vein stenosis, worsening renal function, and death. The patient understands these risk and wishes to proceed.   We will therefore proceed with catheter ablation at the next available time.  Carto, ICE, anesthesia are requested for the procedure.  Will also obtain TEE prior to the procedure to exclude LAA thrombus and further evaluate atrial anatomy.   Current medicines are reviewed at length with the patient today.   The patient does not have concerns regarding his medicines.  The following changes were made today:  none   Signed, [name], MD  06/12/2018 4:00 PM     [place]  [address] [phone] (office) [phone] (fax)

## 2018-06-17 DIAGNOSIS — L820 Inflamed seborrheic keratosis: Secondary | ICD-10-CM | POA: Diagnosis not present

## 2018-06-17 DIAGNOSIS — D485 Neoplasm of uncertain behavior of skin: Secondary | ICD-10-CM | POA: Diagnosis not present

## 2018-06-17 DIAGNOSIS — Z23 Encounter for immunization: Secondary | ICD-10-CM | POA: Diagnosis not present

## 2018-06-17 DIAGNOSIS — L570 Actinic keratosis: Secondary | ICD-10-CM | POA: Diagnosis not present

## 2018-06-22 ENCOUNTER — Other Ambulatory Visit: Payer: Medicare Other

## 2018-06-26 ENCOUNTER — Other Ambulatory Visit: Payer: Medicare Other | Admitting: *Deleted

## 2018-06-26 DIAGNOSIS — Z01812 Encounter for preprocedural laboratory examination: Secondary | ICD-10-CM | POA: Diagnosis not present

## 2018-06-26 DIAGNOSIS — H524 Presbyopia: Secondary | ICD-10-CM | POA: Diagnosis not present

## 2018-06-26 DIAGNOSIS — H40013 Open angle with borderline findings, low risk, bilateral: Secondary | ICD-10-CM | POA: Diagnosis not present

## 2018-06-26 DIAGNOSIS — Z961 Presence of intraocular lens: Secondary | ICD-10-CM | POA: Diagnosis not present

## 2018-06-26 DIAGNOSIS — I4819 Other persistent atrial fibrillation: Secondary | ICD-10-CM

## 2018-06-26 DIAGNOSIS — H02832 Dermatochalasis of right lower eyelid: Secondary | ICD-10-CM | POA: Diagnosis not present

## 2018-06-26 DIAGNOSIS — H02834 Dermatochalasis of left upper eyelid: Secondary | ICD-10-CM | POA: Diagnosis not present

## 2018-06-26 DIAGNOSIS — H02831 Dermatochalasis of right upper eyelid: Secondary | ICD-10-CM | POA: Diagnosis not present

## 2018-06-26 DIAGNOSIS — H52223 Regular astigmatism, bilateral: Secondary | ICD-10-CM | POA: Diagnosis not present

## 2018-06-27 LAB — BASIC METABOLIC PANEL
BUN/Creatinine Ratio: 19 (ref 10–24)
BUN: 17 mg/dL (ref 8–27)
CALCIUM: 9.3 mg/dL (ref 8.6–10.2)
CHLORIDE: 99 mmol/L (ref 96–106)
CO2: 28 mmol/L (ref 20–29)
Creatinine, Ser: 0.9 mg/dL (ref 0.76–1.27)
GFR calc non Af Amer: 84 mL/min/{1.73_m2} (ref >59)
GFR, EST AFRICAN AMERICAN: 97 mL/min/{1.73_m2} (ref >59)
GLUCOSE: 98 mg/dL (ref 65–99)
Potassium: 4.4 mmol/L (ref 3.5–5.2)
Sodium: 142 mmol/L (ref 134–144)

## 2018-07-06 ENCOUNTER — Ambulatory Visit (INDEPENDENT_AMBULATORY_CARE_PROVIDER_SITE_OTHER): Payer: Medicare Other

## 2018-07-06 DIAGNOSIS — I495 Sick sinus syndrome: Secondary | ICD-10-CM

## 2018-07-06 NOTE — Progress Notes (Signed)
Remote pacemaker transmission.   

## 2018-07-07 ENCOUNTER — Ambulatory Visit (HOSPITAL_COMMUNITY): Payer: Medicare Other | Admitting: Anesthesiology

## 2018-07-07 ENCOUNTER — Other Ambulatory Visit: Payer: Self-pay

## 2018-07-07 ENCOUNTER — Ambulatory Visit (HOSPITAL_COMMUNITY)
Admission: RE | Disposition: A | Discharge status: Home / Self Care | Payer: Self-pay | Source: Ambulatory Visit | Attending: Internal Medicine

## 2018-07-07 ENCOUNTER — Ambulatory Visit (HOSPITAL_COMMUNITY)
Admission: RE | Admit: 2018-07-07 | Discharge: 2018-07-08 | Disposition: A | Discharge status: Home / Self Care | Payer: Medicare Other | Source: Ambulatory Visit | Attending: Internal Medicine | Admitting: Internal Medicine

## 2018-07-07 ENCOUNTER — Ambulatory Visit (HOSPITAL_BASED_OUTPATIENT_CLINIC_OR_DEPARTMENT_OTHER): Payer: Medicare Other

## 2018-07-07 ENCOUNTER — Encounter (HOSPITAL_COMMUNITY)
Admission: RE | Disposition: A | Discharge status: Home / Self Care | Payer: Self-pay | Source: Ambulatory Visit | Attending: Internal Medicine

## 2018-07-07 ENCOUNTER — Encounter (HOSPITAL_COMMUNITY): Payer: Self-pay | Admitting: *Deleted

## 2018-07-07 DIAGNOSIS — I2510 Atherosclerotic heart disease of native coronary artery without angina pectoris: Secondary | ICD-10-CM | POA: Diagnosis not present

## 2018-07-07 DIAGNOSIS — I4819 Other persistent atrial fibrillation: Secondary | ICD-10-CM | POA: Diagnosis present

## 2018-07-07 DIAGNOSIS — Z955 Presence of coronary angioplasty implant and graft: Secondary | ICD-10-CM | POA: Insufficient documentation

## 2018-07-07 DIAGNOSIS — Z9889 Other specified postprocedural states: Secondary | ICD-10-CM | POA: Diagnosis not present

## 2018-07-07 DIAGNOSIS — I4891 Unspecified atrial fibrillation: Secondary | ICD-10-CM | POA: Diagnosis not present

## 2018-07-07 DIAGNOSIS — Z7901 Long term (current) use of anticoagulants: Secondary | ICD-10-CM | POA: Diagnosis not present

## 2018-07-07 DIAGNOSIS — I4892 Unspecified atrial flutter: Secondary | ICD-10-CM | POA: Diagnosis not present

## 2018-07-07 DIAGNOSIS — Z8249 Family history of ischemic heart disease and other diseases of the circulatory system: Secondary | ICD-10-CM | POA: Diagnosis not present

## 2018-07-07 DIAGNOSIS — Z9852 Vasectomy status: Secondary | ICD-10-CM | POA: Insufficient documentation

## 2018-07-07 DIAGNOSIS — Z9109 Other allergy status, other than to drugs and biological substances: Secondary | ICD-10-CM | POA: Diagnosis not present

## 2018-07-07 DIAGNOSIS — Z79899 Other long term (current) drug therapy: Secondary | ICD-10-CM | POA: Insufficient documentation

## 2018-07-07 DIAGNOSIS — Z7951 Long term (current) use of inhaled steroids: Secondary | ICD-10-CM | POA: Diagnosis not present

## 2018-07-07 DIAGNOSIS — Z87891 Personal history of nicotine dependence: Secondary | ICD-10-CM | POA: Insufficient documentation

## 2018-07-07 DIAGNOSIS — E785 Hyperlipidemia, unspecified: Secondary | ICD-10-CM | POA: Diagnosis not present

## 2018-07-07 DIAGNOSIS — Z950 Presence of cardiac pacemaker: Secondary | ICD-10-CM | POA: Diagnosis not present

## 2018-07-07 DIAGNOSIS — I340 Nonrheumatic mitral (valve) insufficiency: Secondary | ICD-10-CM

## 2018-07-07 DIAGNOSIS — K219 Gastro-esophageal reflux disease without esophagitis: Secondary | ICD-10-CM | POA: Diagnosis not present

## 2018-07-07 DIAGNOSIS — I051 Rheumatic mitral insufficiency: Secondary | ICD-10-CM | POA: Insufficient documentation

## 2018-07-07 DIAGNOSIS — I10 Essential (primary) hypertension: Secondary | ICD-10-CM | POA: Diagnosis not present

## 2018-07-07 DIAGNOSIS — I495 Sick sinus syndrome: Secondary | ICD-10-CM | POA: Diagnosis not present

## 2018-07-07 HISTORY — PX: ATRIAL FIBRILLATION ABLATION: EP1191

## 2018-07-07 HISTORY — PX: ABLATION OF DYSRHYTHMIC FOCUS: SHX254

## 2018-07-07 HISTORY — PX: TEE WITHOUT CARDIOVERSION: SHX5443

## 2018-07-07 LAB — GLUCOSE, CAPILLARY: Glucose-Capillary: 186 mg/dL — ABNORMAL HIGH (ref 70–99)

## 2018-07-07 LAB — CBC
HCT: 49.3 % (ref 39.0–52.0)
Hemoglobin: 16.3 g/dL (ref 13.0–17.0)
MCH: 31.9 pg (ref 26.0–34.0)
MCHC: 33.1 g/dL (ref 30.0–36.0)
MCV: 96.5 fL (ref 80.0–100.0)
Platelets: 255 10*3/uL (ref 150–400)
RBC: 5.11 MIL/uL (ref 4.22–5.81)
RDW: 12.5 % (ref 11.5–15.5)
WBC: 5.2 10*3/uL (ref 4.0–10.5)
nRBC: 0 % (ref 0.0–0.2)

## 2018-07-07 LAB — POCT ACTIVATED CLOTTING TIME
Activated Clotting Time: 279 seconds
Activated Clotting Time: 351 seconds
Activated Clotting Time: 323 seconds
Activated Clotting Time: 175 seconds

## 2018-07-07 SURGERY — ATRIAL FIBRILLATION ABLATION
Anesthesia: General

## 2018-07-07 SURGERY — ECHOCARDIOGRAM, TRANSESOPHAGEAL
Anesthesia: Moderate Sedation

## 2018-07-07 MED ORDER — SODIUM CHLORIDE 0.9 % IV SOLN: INTRAVENOUS | Status: DC

## 2018-07-07 MED ORDER — SODIUM CHLORIDE 0.9 % IV SOLN
INTRAVENOUS | Status: DC | PRN
  Administered 2018-07-07 (×2): via INTRAVENOUS

## 2018-07-07 MED ORDER — BUPIVACAINE HCL (PF) 0.25 % IJ SOLN
INTRAMUSCULAR | Status: AC
  Filled 2018-07-07: qty 30

## 2018-07-07 MED ORDER — HEPARIN SODIUM (PORCINE) 1000 UNIT/ML IJ SOLN
INTRAMUSCULAR | Status: DC | PRN
  Administered 2018-07-07: 5000 [IU] via INTRAVENOUS
  Administered 2018-07-07: 1000 [IU] via INTRAVENOUS

## 2018-07-07 MED ORDER — LIDOCAINE 2% (20 MG/ML) 5 ML SYRINGE
INTRAMUSCULAR | Status: DC | PRN
  Administered 2018-07-07: 80 mg via INTRAVENOUS

## 2018-07-07 MED ORDER — ONDANSETRON HCL 4 MG/2ML IJ SOLN
INTRAMUSCULAR | Status: DC | PRN
  Administered 2018-07-07: 4 mg via INTRAVENOUS

## 2018-07-07 MED ORDER — SODIUM CHLORIDE 0.9 % IV SOLN
INTRAVENOUS | Status: DC | PRN
  Administered 2018-07-07: 40 ug/min via INTRAVENOUS

## 2018-07-07 MED ORDER — HYDROCODONE-ACETAMINOPHEN 5-325 MG PO TABS: 1.0000 | ORAL_TABLET | ORAL | Status: DC | PRN

## 2018-07-07 MED ORDER — MIDAZOLAM HCL (PF) 5 MG/ML IJ SOLN
INTRAMUSCULAR | Status: AC
  Filled 2018-07-07: qty 2

## 2018-07-07 MED ORDER — BUPIVACAINE HCL (PF) 0.25 % IJ SOLN
INTRAMUSCULAR | Status: DC | PRN
  Administered 2018-07-07: 30 mL

## 2018-07-07 MED ORDER — SUGAMMADEX SODIUM 200 MG/2ML IV SOLN
INTRAVENOUS | Status: DC | PRN
  Administered 2018-07-07: 200 mg via INTRAVENOUS

## 2018-07-07 MED ORDER — FENTANYL CITRATE (PF) 100 MCG/2ML IJ SOLN
INTRAMUSCULAR | Status: DC | PRN
  Administered 2018-07-07 (×3): 25 ug via INTRAVENOUS

## 2018-07-07 MED ORDER — HEPARIN SODIUM (PORCINE) 1000 UNIT/ML IJ SOLN
INTRAMUSCULAR | Status: AC
  Filled 2018-07-07: qty 1

## 2018-07-07 MED ORDER — MIDAZOLAM HCL (PF) 10 MG/2ML IJ SOLN
INTRAMUSCULAR | Status: DC | PRN
  Administered 2018-07-07: 1 mg via INTRAVENOUS
  Administered 2018-07-07 (×2): 2 mg via INTRAVENOUS

## 2018-07-07 MED ORDER — SODIUM CHLORIDE 0.9% FLUSH: 3.0000 mL | Freq: Two times a day (BID) | INTRAVENOUS | Status: DC

## 2018-07-07 MED ORDER — ACETAMINOPHEN 325 MG PO TABS: 650.0000 mg | ORAL_TABLET | ORAL | Status: DC | PRN

## 2018-07-07 MED ORDER — HEPARIN (PORCINE) IN NACL 1000-0.9 UT/500ML-% IV SOLN
INTRAVENOUS | Status: AC
  Filled 2018-07-07: qty 500

## 2018-07-07 MED ORDER — HEPARIN SODIUM (PORCINE) 1000 UNIT/ML IJ SOLN
INTRAMUSCULAR | Status: DC | PRN
  Administered 2018-07-07: 1000 [IU] via INTRAVENOUS
  Administered 2018-07-07: 12000 [IU] via INTRAVENOUS

## 2018-07-07 MED ORDER — ONDANSETRON HCL 4 MG/2ML IJ SOLN: 4.0000 mg | Freq: Four times a day (QID) | INTRAMUSCULAR | Status: DC | PRN

## 2018-07-07 MED ORDER — DEXAMETHASONE SODIUM PHOSPHATE 10 MG/ML IJ SOLN
INTRAMUSCULAR | Status: DC | PRN
  Administered 2018-07-07: 10 mg via INTRAVENOUS

## 2018-07-07 MED ORDER — DIPHENHYDRAMINE HCL 50 MG/ML IJ SOLN
INTRAMUSCULAR | Status: AC
  Filled 2018-07-07: qty 1

## 2018-07-07 MED ORDER — HEPARIN (PORCINE) IN NACL 1000-0.9 UT/500ML-% IV SOLN
INTRAVENOUS | Status: DC | PRN
  Administered 2018-07-07: 500 mL

## 2018-07-07 MED ORDER — SODIUM CHLORIDE 0.9% FLUSH: 3.0000 mL | INTRAVENOUS | Status: DC | PRN

## 2018-07-07 MED ORDER — RIVAROXABAN 20 MG PO TABS
20.0000 mg | ORAL_TABLET | Freq: Every day | ORAL | Status: DC
  Administered 2018-07-07: 20 mg via ORAL
  Filled 2018-07-07: qty 1

## 2018-07-07 MED ORDER — BUTAMBEN-TETRACAINE-BENZOCAINE 2-2-14 % EX AERO
INHALATION_SPRAY | CUTANEOUS | Status: DC | PRN
  Administered 2018-07-07: 2 via TOPICAL

## 2018-07-07 MED ORDER — DOFETILIDE 500 MCG PO CAPS
500.0000 ug | ORAL_CAPSULE | Freq: Two times a day (BID) | ORAL | Status: DC
  Administered 2018-07-07 – 2018-07-08 (×2): 500 ug via ORAL
  Filled 2018-07-07 (×2): qty 1

## 2018-07-07 MED ORDER — PROPOFOL 10 MG/ML IV BOLUS
INTRAVENOUS | Status: DC | PRN
  Administered 2018-07-07: 50 mg via INTRAVENOUS
  Administered 2018-07-07: 150 mg via INTRAVENOUS

## 2018-07-07 MED ORDER — SODIUM CHLORIDE 0.9 % IV SOLN: 250.0000 mL | INTRAVENOUS | Status: DC | PRN

## 2018-07-07 MED ORDER — FUROSEMIDE 80 MG PO TABS
120.0000 mg | ORAL_TABLET | Freq: Every day | ORAL | Status: DC
  Administered 2018-07-08: 120 mg via ORAL
  Filled 2018-07-07: qty 1

## 2018-07-07 MED ORDER — FENTANYL CITRATE (PF) 100 MCG/2ML IJ SOLN
INTRAMUSCULAR | Status: AC
  Filled 2018-07-07: qty 2

## 2018-07-07 MED ORDER — FENTANYL CITRATE (PF) 100 MCG/2ML IJ SOLN
INTRAMUSCULAR | Status: DC | PRN
  Administered 2018-07-07: 50 ug via INTRAVENOUS
  Administered 2018-07-07 (×2): 25 ug via INTRAVENOUS

## 2018-07-07 MED ORDER — ROCURONIUM BROMIDE 50 MG/5ML IV SOSY
PREFILLED_SYRINGE | INTRAVENOUS | Status: DC | PRN
  Administered 2018-07-07: 50 mg via INTRAVENOUS
  Administered 2018-07-07: 10 mg via INTRAVENOUS

## 2018-07-07 MED ORDER — PROTAMINE SULFATE 10 MG/ML IV SOLN
INTRAVENOUS | Status: DC | PRN
  Administered 2018-07-07: 30 mg via INTRAVENOUS
  Administered 2018-07-07: 10 mg via INTRAVENOUS

## 2018-07-07 MED ORDER — PHENYLEPHRINE HCL 10 MG/ML IJ SOLN
INTRAMUSCULAR | Status: DC | PRN
  Administered 2018-07-07: 40 ug via INTRAVENOUS

## 2018-07-07 SURGICAL SUPPLY — 18 items
BLANKET WARM UNDERBOD FULL ACC (MISCELLANEOUS) ×2 IMPLANT
CATH MAPPNG PENTARAY F 2-6-2MM (CATHETERS) IMPLANT
CATH NAVISTAR SMARTTOUCH DF (ABLATOR) ×2 IMPLANT
CATH SOUNDSTAR 3D IMAGING (CATHETERS) ×2 IMPLANT
CATH WEBSTER BI DIR CS D-F CRV (CATHETERS) ×2 IMPLANT
COVER SWIFTLINK CONNECTOR (BAG) ×2 IMPLANT
NDL BAYLIS TRANSSEPTAL 71CM (NEEDLE) IMPLANT
NEEDLE BAYLIS TRANSSEPTAL 71CM (NEEDLE) ×3 IMPLANT
PACK EP LATEX FREE (CUSTOM PROCEDURE TRAY) ×3
PACK EP LF (CUSTOM PROCEDURE TRAY) ×1 IMPLANT
PAD PRO RADIOLUCENT 2001M-C (PAD) ×3 IMPLANT
PATCH CARTO3 (PAD) ×2 IMPLANT
PENTARAY F 2-6-2MM (CATHETERS) ×3
SHEATH AVANTI 11F 11CM (SHEATH) ×2 IMPLANT
SHEATH PINNACLE 7F 10CM (SHEATH) ×4 IMPLANT
SHEATH PINNACLE 9F 10CM (SHEATH) ×2 IMPLANT
SHEATH SWARTZ TS SL2 63CM 8.5F (SHEATH) ×2 IMPLANT
TUBING SMART ABLATE COOLFLOW (TUBING) ×2 IMPLANT

## 2018-07-07 NOTE — Transfer of Care (Signed)
Immediate Anesthesia Transfer of Care Note  Patient: [name]  Procedure(s) Performed: ATRIAL FIBRILLATION ABLATION (N/A )  Patient Location: Cath Lab  Anesthesia Type:General  Level of Consciousness: awake, alert  and oriented  Airway & Oxygen Therapy: Patient Spontanous Breathing and Patient connected to nasal cannula oxygen  Post-op Assessment: Report given to RN, Post -op Vital signs reviewed and stable and Patient moving all extremities X 4  Post vital signs: Reviewed and stable  Last Vitals:  Vitals Value Taken Time  BP 122/63 07/07/2018  1:31 PM  Temp    Pulse 122 07/07/2018  1:33 PM  Resp 18 07/07/2018  1:33 PM  SpO2 91 % 07/07/2018  1:33 PM  Vitals shown include unvalidated device data.  Last Pain:  Vitals:   07/07/18 0915  TempSrc:   PainSc: 0-No pain         Complications: No apparent anesthesia complications

## 2018-07-07 NOTE — CV Procedure (Signed)
    Transesophageal Echocardiogram Note  [name] [id number] April 15, 1946  Procedure: Transesophageal Echocardiogram Indications: Atrial fib - pre ablation   Procedure Details Consent: Obtained Time Out: Verified patient identification, verified procedure, site/side was marked, verified correct patient position, special equipment/implants available, Radiology Safety Procedures followed,  medications/allergies/relevent history reviewed, required imaging and test results available.  Performed  Medications:  During this procedure the patient is administered a total of Versed 5  mg and Fentanyl 75  mcg  to achieve and maintain moderate conscious sedation.  The patient's heart rate, blood pressure, and oxygen saturation are monitored continuously during the procedure. The period of conscious sedation is 30 minutes, of which I was present face-to-face 100% of this time.  Left Ventrical:  Normal LV function   Mitral Valve: mild - mod MR   Aortic Valve: normal   Tricuspid Valve: trace TR   Pulmonic Valve: trivial PI,   Left Atrium/ Left atrial appendage: no thrombi  Right Atrium / Right ventrical :    Pacing wires present   Atrial septum: no ASD or PFO by color doppler   Aorta: mild calcified plaque    Complications: No apparent complications Patient did tolerate procedure well.   Thayer Headings, Brooke Bonito., MD, [facility] 07/07/2018, 8:35 AM

## 2018-07-07 NOTE — Anesthesia Preprocedure Evaluation (Addendum)
Anesthesia Evaluation  Patient identified by MRN, date of birth, ID band Patient awake    Reviewed: Allergy & Precautions, H&P , NPO status , Patient's Chart, lab work & pertinent test results  Airway Mallampati: III  TM Distance: >3 FB Neck ROM: Full    Dental no notable dental hx.    Pulmonary former smoker,    Pulmonary exam normal breath sounds clear to auscultation       Cardiovascular hypertension, Pt. on medications + CAD (non-obstructive 2 vessle disease)  Normal cardiovascular exam+ dysrhythmias (s/p ablation 2006 for flutter. recent afib. RBBB) Atrial Fibrillation + pacemaker (sick sinus syndrome)  Rhythm:Regular Rate:Normal  TTE 05/2018: EF 50-55%, biatrial enlargement mild MR   Neuro/Psych Depression negative neurological ROS  negative psych ROS   GI/Hepatic Neg liver ROS, GERD  ,  Endo/Other  negative endocrine ROS  Renal/GU negative Renal ROS  negative genitourinary   Musculoskeletal negative musculoskeletal ROS (+)   Abdominal   Peds negative pediatric ROS (+)  Hematology negative hematology ROS (+)   Anesthesia Other Findings   Reproductive/Obstetrics negative OB ROS                            Anesthesia Physical  Anesthesia Plan  ASA: III  Anesthesia Plan: General   Post-op Pain Management:    Induction: Intravenous  PONV Risk Score and Plan: 2 and Ondansetron and Midazolam  Airway Management Planned: Oral ETT  Additional Equipment:   Intra-op Plan:   Post-operative Plan: Extubation in OR  Informed Consent: I have reviewed the patients History and Physical, chart, labs and discussed the procedure including the risks, benefits and alternatives for the proposed anesthesia with the patient or authorized representative who has indicated his/her understanding and acceptance.   Dental advisory given  Plan Discussed with: CRNA  Anesthesia Plan Comments:          Anesthesia Quick Evaluation

## 2018-07-07 NOTE — Interval H&P Note (Signed)
History and Physical Interval Note:  07/07/2018 7:30 AM  [name]  has presented today for surgery, with the diagnosis of afib  The various methods of treatment have been discussed with the patient and family. After consideration of risks, benefits and other options for treatment, the patient has consented to  Procedure(s): ATRIAL FIBRILLATION ABLATION (N/A) as a surgical intervention .  The patient's history has been reviewed, patient examined, no change in status, stable for surgery.  I have reviewed the patient's chart and labs.  Questions were answered to the patient's satisfaction.    Reports compliance with xarelto without interruption.  TEE planned for this am.  [name] MD, [facility] 07/07/2018 7:31 AM

## 2018-07-07 NOTE — Anesthesia Procedure Notes (Signed)
Procedure Name: Intubation Date/Time: 07/07/2018 11:00 AM Performed by: Neldon Newport, CRNA Pre-anesthesia Checklist: Timeout performed, Patient being monitored, Suction available, Emergency Drugs available and Patient identified Patient Re-evaluated:Patient Re-evaluated prior to induction Oxygen Delivery Method: Circle system utilized Preoxygenation: Pre-oxygenation with 100% oxygen Induction Type: IV induction Ventilation: Mask ventilation without difficulty and Oral airway inserted - appropriate to patient size Laryngoscope Size: Mac and 4 Grade View: Grade II Tube type: Oral Tube size: 7.5 mm Number of attempts: 1 Placement Confirmation: breath sounds checked- equal and bilateral,  positive ETCO2 and ETT inserted through vocal cords under direct vision Secured at: 24 cm Tube secured with: Tape Dental Injury: Teeth and Oropharynx as per pre-operative assessment

## 2018-07-07 NOTE — Progress Notes (Signed)
  Echocardiogram Echocardiogram Transesophageal has been performed.  [name] 07/07/2018, 8:40 AM

## 2018-07-07 NOTE — Interval H&P Note (Signed)
History and Physical Interval Note:  07/07/2018 8:31 AM  [name]  has presented today for surgery, with the diagnosis of AFIB  The various methods of treatment have been discussed with the patient and family. After consideration of risks, benefits and other options for treatment, the patient has consented to  Procedure(s): TRANSESOPHAGEAL ECHOCARDIOGRAM (TEE) (N/A) as a surgical intervention .  The patient's history has been reviewed, patient examined, no change in status, stable for surgery.  I have reviewed the patient's chart and labs.  Questions were answered to the patient's satisfaction.     Mertie Moores

## 2018-07-07 NOTE — Anesthesia Postprocedure Evaluation (Signed)
Anesthesia Post Note  Patient: [name]  Procedure(s) Performed: ATRIAL FIBRILLATION ABLATION (N/A )     Patient location during evaluation: PACU Anesthesia Type: General Level of consciousness: awake and alert Pain management: pain level controlled Vital Signs Assessment: post-procedure vital signs reviewed and stable Respiratory status: spontaneous breathing, nonlabored ventilation and respiratory function stable Cardiovascular status: blood pressure returned to baseline and stable Postop Assessment: no apparent nausea or vomiting Anesthetic complications: no    Last Vitals:  Vitals:   07/07/18 1345 07/07/18 1405  BP: 110/68   Pulse: 60   Resp: 19   Temp:  (!) 36.3 C  SpO2: 92%     Last Pain:  Vitals:   07/07/18 1405  TempSrc: Temporal  PainSc:                  Brennan Bailey

## 2018-07-07 NOTE — Progress Notes (Addendum)
Site area: RFV x 3 Site Prior to Removal:  Level 0 Pressure Applied For: 30 min Manual:   yes Patient Status During Pull:  stable Post Pull Site:  Level 0 Post Pull Instructions Given:  yes Post Pull Pulses Present: palpable Dressing Applied:  clear Bedrest begins @ 1430 till 2030 Comments:

## 2018-07-08 ENCOUNTER — Encounter (HOSPITAL_COMMUNITY): Payer: Self-pay | Admitting: Cardiovascular Disease

## 2018-07-08 DIAGNOSIS — Z9109 Other allergy status, other than to drugs and biological substances: Secondary | ICD-10-CM | POA: Diagnosis not present

## 2018-07-08 DIAGNOSIS — I4819 Other persistent atrial fibrillation: Secondary | ICD-10-CM

## 2018-07-08 DIAGNOSIS — Z7901 Long term (current) use of anticoagulants: Secondary | ICD-10-CM | POA: Diagnosis not present

## 2018-07-08 DIAGNOSIS — I051 Rheumatic mitral insufficiency: Secondary | ICD-10-CM | POA: Diagnosis not present

## 2018-07-08 DIAGNOSIS — I2510 Atherosclerotic heart disease of native coronary artery without angina pectoris: Secondary | ICD-10-CM | POA: Diagnosis not present

## 2018-07-08 DIAGNOSIS — Z7951 Long term (current) use of inhaled steroids: Secondary | ICD-10-CM | POA: Diagnosis not present

## 2018-07-08 DIAGNOSIS — I495 Sick sinus syndrome: Secondary | ICD-10-CM | POA: Diagnosis not present

## 2018-07-08 DIAGNOSIS — E785 Hyperlipidemia, unspecified: Secondary | ICD-10-CM | POA: Diagnosis not present

## 2018-07-08 DIAGNOSIS — Z79899 Other long term (current) drug therapy: Secondary | ICD-10-CM | POA: Diagnosis not present

## 2018-07-08 DIAGNOSIS — I4892 Unspecified atrial flutter: Secondary | ICD-10-CM | POA: Diagnosis not present

## 2018-07-08 DIAGNOSIS — Z950 Presence of cardiac pacemaker: Secondary | ICD-10-CM | POA: Diagnosis not present

## 2018-07-08 DIAGNOSIS — Z87891 Personal history of nicotine dependence: Secondary | ICD-10-CM | POA: Diagnosis not present

## 2018-07-08 LAB — BASIC METABOLIC PANEL
Anion gap: 10 (ref 5–15)
BUN: 13 mg/dL (ref 8–23)
CHLORIDE: 99 mmol/L (ref 98–111)
CO2: 28 mmol/L (ref 22–32)
Calcium: 8.8 mg/dL — ABNORMAL LOW (ref 8.9–10.3)
Creatinine, Ser: 0.96 mg/dL (ref 0.61–1.24)
GFR calc Af Amer: >60 mL/min (ref >60)
GFR calc non Af Amer: >60 mL/min (ref >60)
Glucose, Bld: 144 mg/dL — ABNORMAL HIGH (ref 70–99)
Potassium: 3.6 mmol/L (ref 3.5–5.1)
SODIUM: 137 mmol/L (ref 135–145)

## 2018-07-08 LAB — MAGNESIUM: MAGNESIUM: 2 mg/dL (ref 1.7–2.4)

## 2018-07-08 MED ORDER — PANTOPRAZOLE SODIUM 40 MG PO TBEC: 40.0000 mg | DELAYED_RELEASE_TABLET | Freq: Every day | ORAL | 0 refills | Status: DC

## 2018-07-08 NOTE — Discharge Summary (Addendum)
ELECTROPHYSIOLOGY PROCEDURE DISCHARGE SUMMARY    Patient ID: [name],  MRN: [id number], DOB/AGE: 74-31-1947 74 y.o.  Admit date: 07/07/2018 Discharge date: 07/08/2018  Primary Care Physician: [name], MD Electrophysiologist: Caryl Comes  Primary Discharge Diagnosis:  Persistent atrial fibrillation status post ablation this admission  Secondary Discharge Diagnosis:  1.  Atrial flutter s/p prior ablation 2.  SSS s/p BSX dual chamber PPM 3.  HTN 4.  Hyperlipidemia 5.  GERD  Procedures This Admission:  1.  Electrophysiology study and radiofrequency catheter ablation on 07/07/18 by Dr [name].  This study demonstrated atrial fibrillation upon presentation; intracardiac echo reveals a severely enlarged right atrium with moderately enlarged left atrium with four separate pulmonary veins without evidence of pulmonary vein stenosis; successful electrical isolation and anatomical encircling of all four pulmonary veins with radiofrequency current.  A WACA approach was used; additional left atrial ablation was performed with a standard box lesion created along the posterior wall of the left atrium; atrial fibrillation cardioverted to sinus rhythm; no early apparent complications..    Brief HPI: [name] is a 72 y.o. male with a history of persistent atrial fibrillation.  They have failed medical therapy with Tikosyn. Risks, benefits, and alternatives to catheter ablation of atrial fibrillation were reviewed with the patient who wished to proceed.  The patient underwent TEE prior to the procedure which demonstrated normal LV function and no LAA thrombus.    Hospital Course:  The patient was admitted and underwent EPS/RFCA of atrial fibrillation with details as outlined above.  They were monitored on telemetry overnight which demonstrated atrial fibrillation.  Groin was without complication on the day of discharge.  The patient was examined and considered to be stable for discharge.   Wound care and restrictions were reviewed with the patient.  The patient will be seen back by Roderic Palau, NP in 4 weeks and Dr Rayann Heman in 12 weeks for post ablation follow up.   This patients CHA2DS2-VASc Score and unadjusted Ischemic Stroke Rate (% per year) is equal to 2.2 % stroke rate/year from a score of 2 Above score calculated as 1 point each if present [CHF, HTN, DM, Vascular=MI/PAD/Aortic Plaque, Age if 65-74, or Male] Above score calculated as 2 points each if present [Age > 75, or Stroke/TIA/TE]   Physical Exam: Vitals:   07/07/18 1445 07/07/18 1500 07/07/18 1938 07/08/18 0514  BP: 133/82 128/88 122/63 134/85  Pulse: (!) 118 78  85  Resp: (!) 8 14 17 12   Temp:  98.3 F (36.8 C) 98.4 F (36.9 C) 98.1 F (36.7 C)  TempSrc:  Oral Oral Oral  SpO2: 91% 93% 96% 95%  Weight:    91.9 kg  Height:        GEN- The patient is well appearing, alert and oriented x 3 today.   HEENT: normocephalic, atraumatic; sclera clear, conjunctiva pink; hearing intact; oropharynx clear; neck supple  Lungs- Clear to ausculation bilaterally, normal work of breathing.  No wheezes, rales, rhonchi Heart- Irregular rate and rhythm  GI- soft, non-tender, non-distended, bowel sounds present  Extremities- no clubbing, cyanosis, or edema;   groin without hematoma/bruit MS- no significant deformity or atrophy Skin- warm and dry, no rash or lesion Psych- euthymic mood, full affect Neuro- strength and sensation are intact   Labs:   Lab Results  Component Value Date   WBC 5.2 07/07/2018   HGB 16.3 07/07/2018   HCT 49.3 07/07/2018   MCV 96.5 07/07/2018   PLT 255 07/07/2018  Recent Labs  Lab 07/08/18 0259  NA 137  K 3.6  CL 99  CO2 28  BUN 13  CREATININE 0.96  CALCIUM 8.8*  GLUCOSE 144*     Discharge Medications:  Allergies as of 07/08/2018      Reactions   Horse-derived Products Hives      Medication List    TAKE these medications   cetirizine 10 MG tablet Commonly known as:   ZYRTEC Take 10 mg by mouth daily.   dofetilide 500 MCG capsule Commonly known as:  TIKOSYN TAKE 1 CAPSULE TWICE A DAY ( KEEP UPCOMING APPOINTMENT IN SEPTEMBER FOR FUTURE REFILLS ) What changed:  See the new instructions.   fluticasone 50 MCG/ACT nasal spray Commonly known as:  FLONASE Place 1-2 sprays into both nostrils at bedtime.   furosemide 40 MG tablet Commonly known as:  LASIX Take 3 tablets (120 mg total) by mouth daily.   metolazone 2.5 MG tablet Commonly known as:  ZAROXOLYN Take 1 tablet (2.5 mg total) by mouth once a week. Please keep upcoming appt with Dr. Caryl Comes in September before anymore refills. Thank you What changed:  when to take this   OVER THE COUNTER MEDICATION Take 20 drops by mouth daily as needed (pain, mood).   OVER THE COUNTER MEDICATION Take 1 tablet by mouth daily.   OVER THE COUNTER MEDICATION Take 1 Scoop by mouth daily.   pantoprazole 40 MG tablet Commonly known as:  PROTONIX Take 1 tablet (40 mg total) by mouth daily.   rivaroxaban 20 MG Tabs tablet Commonly known as:  XARELTO Take 20 mg by mouth every morning.   VIAGRA 100 MG tablet Generic drug:  sildenafil Take 1 tablet by mouth as needed for erectile dysfunction.       Disposition:  Discharge Instructions    Diet - low sodium heart healthy   Complete by:  As directed    Increase activity slowly   Complete by:  As directed      Follow-up Information    [facility] Follow up on 08/03/2018.   Specialty:  Cardiology Why:  at 1:30PM  Contact information: [address] [id number] [place] [phone]       [name], MD Follow up on 10/16/2018.   Specialty:  Cardiology Why:  at 1:45PM  Contact information: [place] [address] [phone]           Duration of Discharge Encounter: Greater than 30 minutes including physician time.  Signed, Chanetta Marshall,  NP 07/08/2018 8:20 AM   I have seen, examined the patient, and reviewed the above assessment and plan.  Changes to above are made where necessary.  On exam, iRRR.  Doing well post ablation.  He has had some ERAF.  DC to home with close outpatient follow-up.  Co Sign: [name], MD 07/08/2018 4:12 PM

## 2018-07-08 NOTE — Discharge Instructions (Signed)
No driving for 4 days. No lifting over 5 lbs for 1 week. No sexual activity for 1 week. You may return to work in 1 week. Keep procedure site clean & dry. If you notice increased pain, swelling, bleeding or pus, call/return!  You may shower, but no soaking baths/hot tubs/pools for 1 week.  ° ° °You have an appointment set up with the Atrial Fibrillation Clinic.  Multiple studies have shown that being followed by a dedicated atrial fibrillation clinic in addition to the standard care you receive from your other physicians improves health. We believe that enrollment in the atrial fibrillation clinic will allow us to better care for you.  ° °The phone number to the Atrial Fibrillation Clinic is [phone]. The clinic is staffed Monday through Friday from 8:30am to 5pm. ° °Parking Directions: The clinic is located in the Heart and Vascular Building connected to [facility]. °1)From [address] turn on to [address] and go to the 3rd entrance  (Heart and Vascular entrance) on the right. °2)Look to the right for Heart &Vascular Parking Garage. °3)A code for the entrance is required please call the clinic to receive this.   °4)Take the elevators to the 1st floor. Registration is in the room with the glass walls at the end of the hallway. ° °If you have any trouble parking or locating the clinic, please don’t hesitate to call [phone]. ° ° °

## 2018-07-09 MED FILL — Heparin Sod (Porcine)-NaCl IV Soln 1000 Unit/500ML-0.9%: INTRAVENOUS | Qty: 500 | Status: AC

## 2018-08-03 ENCOUNTER — Ambulatory Visit (HOSPITAL_COMMUNITY)
Admission: RE | Admit: 2018-08-03 | Discharge: 2018-08-03 | Disposition: A | Discharge status: Home / Self Care | Payer: Medicare Other | Source: Ambulatory Visit | Attending: Nurse Practitioner | Admitting: Nurse Practitioner

## 2018-08-03 VITALS — BP 146/82 | HR 78 | Ht 72.0 in | Wt 208.0 lb

## 2018-08-03 DIAGNOSIS — I2510 Atherosclerotic heart disease of native coronary artery without angina pectoris: Secondary | ICD-10-CM | POA: Insufficient documentation

## 2018-08-03 DIAGNOSIS — Z823 Family history of stroke: Secondary | ICD-10-CM | POA: Insufficient documentation

## 2018-08-03 DIAGNOSIS — I4819 Other persistent atrial fibrillation: Secondary | ICD-10-CM | POA: Insufficient documentation

## 2018-08-03 DIAGNOSIS — Z8249 Family history of ischemic heart disease and other diseases of the circulatory system: Secondary | ICD-10-CM | POA: Insufficient documentation

## 2018-08-03 DIAGNOSIS — I495 Sick sinus syndrome: Secondary | ICD-10-CM | POA: Insufficient documentation

## 2018-08-03 DIAGNOSIS — Z7901 Long term (current) use of anticoagulants: Secondary | ICD-10-CM | POA: Insufficient documentation

## 2018-08-03 DIAGNOSIS — Z950 Presence of cardiac pacemaker: Secondary | ICD-10-CM | POA: Insufficient documentation

## 2018-08-03 DIAGNOSIS — E785 Hyperlipidemia, unspecified: Secondary | ICD-10-CM | POA: Insufficient documentation

## 2018-08-03 DIAGNOSIS — Z87891 Personal history of nicotine dependence: Secondary | ICD-10-CM | POA: Insufficient documentation

## 2018-08-03 DIAGNOSIS — Z79899 Other long term (current) drug therapy: Secondary | ICD-10-CM | POA: Diagnosis not present

## 2018-08-03 DIAGNOSIS — I493 Ventricular premature depolarization: Secondary | ICD-10-CM | POA: Insufficient documentation

## 2018-08-03 DIAGNOSIS — K219 Gastro-esophageal reflux disease without esophagitis: Secondary | ICD-10-CM | POA: Diagnosis not present

## 2018-08-03 DIAGNOSIS — I10 Essential (primary) hypertension: Secondary | ICD-10-CM | POA: Diagnosis not present

## 2018-08-03 LAB — CBC
HCT: 47 % (ref 39.0–52.0)
Hemoglobin: 15.9 g/dL (ref 13.0–17.0)
MCH: 33.3 pg (ref 26.0–34.0)
MCHC: 33.8 g/dL (ref 30.0–36.0)
MCV: 98.5 fL (ref 80.0–100.0)
Platelets: 217 10*3/uL (ref 150–400)
RBC: 4.77 MIL/uL (ref 4.22–5.81)
RDW: 12.8 % (ref 11.5–15.5)
WBC: 6.1 10*3/uL (ref 4.0–10.5)
nRBC: 0 % (ref 0.0–0.2)

## 2018-08-03 LAB — BASIC METABOLIC PANEL
Anion gap: 9 (ref 5–15)
BUN: 17 mg/dL (ref 8–23)
CHLORIDE: 101 mmol/L (ref 98–111)
CO2: 31 mmol/L (ref 22–32)
Calcium: 9.3 mg/dL (ref 8.9–10.3)
Creatinine, Ser: 0.89 mg/dL (ref 0.61–1.24)
GFR calc Af Amer: >60 mL/min (ref >60)
GFR calc non Af Amer: >60 mL/min (ref >60)
Glucose, Bld: 109 mg/dL — ABNORMAL HIGH (ref 70–99)
Potassium: 4.4 mmol/L (ref 3.5–5.1)
Sodium: 141 mmol/L (ref 135–145)

## 2018-08-03 NOTE — Progress Notes (Signed)
Primary Care Physician: [name], MD Referring Physician: Dr. Stark Falls is a 72 y.o. male with a h/o persistent afib, that is in the afib clinic for f/u ablation one month ago. He left from the hospital in afib and interrogation today reveals persistent afib, rate controlled, since the procedure, despite being on dofetilide. Pt can not tell if he is in or out of afib.  Today, he denies symptoms of palpitations, chest pain, shortness of breath, orthopnea, PND, lower extremity edema, dizziness, presyncope, syncope, or neurologic sequela. The patient is tolerating medications without difficulties and is otherwise without complaint today.   Past Medical History:  Diagnosis Date  . 1st degree AV block   . Atrial flutter ([place])    s/p ablation 2006  . CAD (coronary artery disease)    Mild 2 vessel disease per cath in 2006; myoview neg 2011  . Chronic anticoagulation    on Xarelto - started 11/12/11  . Depression 11-05-11   On Cymbalta  . GERD (gastroesophageal reflux disease)   . Hyperlipidemia   . Hypertension   . Normal nuclear stress test 2011  . Persistent atrial fibrillation    recent new occurrence 3/13>>Tikosyn  . PVC's (premature ventricular contractions)   . Right ventricular enlargement    Mild noted echo 3/13  . Sick sinus syndrome ([place]) 11/23/2012   Dr Caryl Comes  . Tobacco abuse    Past Surgical History:  Procedure Laterality Date  . ABLATION OF DYSRHYTHMIC FOCUS  07/07/2018  . ATRIAL FIBRILLATION ABLATION N/A 07/07/2018   Procedure: ATRIAL FIBRILLATION ABLATION;  Surgeon: [name], MD;  Location: [facility];  Service: Cardiovascular;  Laterality: N/A;  . CARDIAC CATHETERIZATION  11/1988, 12/07/2004   showing essentially normal left  ventricular function, moderate two-vessel disease (50% LAD after the third diagonal and a 60+% stenosis in the second obtuse marginal)         . CARDIOVERSION N/A 12/05/2011   Procedure: CARDIOVERSION;  Surgeon: Deboraha Sprang, MD;  Location: [facility] CATH LAB;  Service: Cardiovascular;  Laterality: N/A;  . FOOT SURGERY  1996   left-retained hardware  . HERNIA REPAIR    . INGUINAL HERNIA REPAIR  01/28/2008   Recurrent right inguinal hernia Laparoscopic, preperitoneal repair of recurrent right inguinal hernia with mesh -- SURGEON:  Dr. Fanny Skates.  . INGUINAL HERNIA REPAIR  11/06/2011   Procedure: HERNIA REPAIR INGUINAL ADULT;  Surgeon: Adin Hector, MD;  Location: [facility];  Service: General;  Laterality: Left;  . PACEMAKER INSERTION  11/23/2012   Dr Caryl Comes  . PERMANENT PACEMAKER INSERTION N/A 11/23/2012   Procedure: PERMANENT PACEMAKER INSERTION;  Surgeon: Deboraha Sprang, MD;  Location: [facility] CATH LAB;  Service: Cardiovascular;  Laterality: N/A;  . TEE WITH CARDIOVERSION  2001, 2006   Invasive electrophysiology study, electroanatomical mapping,and radiofrequency catheter ablation. -- patient's arrhythmia was isthmus dependent flutter not withstanding the very long cycle length.  RF energy delivered across the cavotricuspid isthmus successfully interrupted conduction across the isthmus and eliminated the patient's sub-straight. PHYSICIAN:  Deboraha Sprang, M.D  . TEE WITHOUT CARDIOVERSION  12/31/2011   Procedure: TRANSESOPHAGEAL ECHOCARDIOGRAM (TEE);  Surgeon: Josue Hector, MD;  Location: [facility] ENDOSCOPY;  Service: Cardiovascular;  Laterality: N/A;  tba after for tikosyn  . TEE WITHOUT CARDIOVERSION N/A 07/07/2018   Procedure: TRANSESOPHAGEAL ECHOCARDIOGRAM (TEE);  Surgeon: Acie Fredrickson Wonda Cheng, MD;  Location: [place];  Service: Cardiovascular;  Laterality: N/A;  . VASECTOMY  Current Outpatient Medications  Medication Sig Dispense Refill  . cetirizine (ZYRTEC) 10 MG tablet Take 10 mg by mouth daily.    [name] dofetilide (TIKOSYN) 500 MCG capsule TAKE 1 CAPSULE TWICE A DAY ( KEEP UPCOMING APPOINTMENT IN SEPTEMBER FOR FUTURE REFILLS ) (Patient taking differently: Take 500 mcg by mouth 2 (two) times daily. ) 180 capsule  3  . fluticasone (FLONASE) 50 MCG/ACT nasal spray Place 1-2 sprays into both nostrils at bedtime.     . furosemide (LASIX) 40 MG tablet Take 3 tablets (120 mg total) by mouth daily. 270 tablet 3  . metolazone (ZAROXOLYN) 2.5 MG tablet Take 1 tablet (2.5 mg total) by mouth once a week. Please keep upcoming appt with Dr. Caryl Comes in September before anymore refills. Thank you (Patient taking differently: Take 2.5 mg by mouth every 14 (fourteen) days. Please keep upcoming appt with Dr. Caryl Comes in September before anymore refills. Thank you) 5 tablet 1  . OVER THE COUNTER MEDICATION Take 20 drops by mouth daily as needed (pain, mood).     [name] OVER THE COUNTER MEDICATION Take 1 tablet by mouth daily.    [name] OVER THE COUNTER MEDICATION Take 1 Scoop by mouth daily.    . pantoprazole (PROTONIX) 40 MG tablet Take 1 tablet (40 mg total) by mouth daily. 45 tablet 0  . rivaroxaban (XARELTO) 20 MG TABS tablet Take 20 mg by mouth every morning.     [name] VIAGRA 100 MG tablet Take 1 tablet by mouth as needed for erectile dysfunction.     No current facility-administered medications for this encounter.     Allergies  Allergen Reactions  . Horse-Derived Products Hives    Social History   Socioeconomic History  . Marital status: Widowed    Spouse name: Not on file  . Number of children: Not on file  . Years of education: Not on file  . Highest education level: Not on file  Occupational History  . Not on file  Social Needs  . Financial resource strain: Not on file  . Food insecurity:    Worry: Not on file    Inability: Not on file  . Transportation needs:    Medical: Not on file    Non-medical: Not on file  Tobacco Use  . Smoking status: Former Smoker    Years: 4.00    Types: Cigarettes    Last attempt to quit: 08/05/1966    Years since quitting: 52.0  . Smokeless tobacco: Never Used  Substance and Sexual Activity  . Alcohol use: Yes    Alcohol/week: 2.0 standard drinks    Types: 2 Shots of liquor per  week    Comment: DAILY  . Drug use: No  . Sexual activity: Not Currently  Lifestyle  . Physical activity:    Days per week: Not on file    Minutes per session: Not on file  . Stress: Not on file  Relationships  . Social connections:    Talks on phone: Not on file    Gets together: Not on file    Attends religious service: Not on file    Active member of club or organization: Not on file    Attends meetings of clubs or organizations: Not on file    Relationship status: Not on file  . Intimate partner violence:    Fear of current or ex partner: Not on file    Emotionally abused: Not on file    Physically abused: Not on file  Forced sexual activity: Not on file  Other Topics Concern  . Not on file  Social History Narrative  . Not on file    Family History  Problem Relation Age of Onset  . Heart attack Father 68  . Cancer Father        colon  . Kidney disease Father        End Stage  . Coronary artery disease Father        Had Valve Replacement  . Atrial fibrillation Mother        flutter  . Heart failure Mother 64  . Cancer Sister        colon  . Cancer Paternal Uncle        lung    ROS- All systems are reviewed and negative except as per the HPI above  Physical Exam: Vitals:   08/03/18 1326  Weight: 94.3 kg  Height: 6' (1.829 m)   Wt Readings from Last 3 Encounters:  08/03/18 94.3 kg  07/08/18 91.9 kg  06/12/18 92.4 kg    Labs: Lab Results  Component Value Date   NA 137 07/08/2018   K 3.6 07/08/2018   CL 99 07/08/2018   CO2 28 07/08/2018   GLUCOSE 144 (H) 07/08/2018   BUN 13 07/08/2018   CREATININE 0.96 07/08/2018   CALCIUM 8.8 (L) 07/08/2018   MG 2.0 07/08/2018   Lab Results  Component Value Date   INR 1.2 (H) 11/19/2012   Lab Results  Component Value Date   CHOL 212 (H) 10/26/2012   HDL 65.50 10/26/2012   TRIG 66.0 10/26/2012     GEN- The patient is well appearing, alert and oriented x 3 today.   Head- normocephalic,  atraumatic Eyes-  Sclera clear, conjunctiva pink Ears- hearing intact Oropharynx- clear Neck- supple, no JVP Lymph- no cervical lymphadenopathy Lungs- Clear to ausculation bilaterally, normal work of breathing Heart- Slightly irregular rate and rhythm, no murmurs, rubs or gallops, PMI not laterally displaced GI- soft, NT, ND, + BS Extremities- no clubbing, cyanosis, or edema MS- no significant deformity or atrophy Skin- no rash or lesion Psych- euthymic mood, full affect Neuro- strength and sensation are intact  EKG-afib at 78 bpm, QRS int 146 ms, QTc 501 ms Epic records reviewed Brief interrogation shows persistent afib since ablation   Assessment and Plan: 1. Persistent afib He is rate controlled and asymptomatic  Discussed cardioversion with pt, risks and benefit and he is willing to proceed States no missed xarelto  for at least the last 3 weeks Continue dofetilide 500 mcg bid  Cardioversion scheduled for 1/3 Bmet, cbc today  F/u here in one week  Butch Penny C. Carroll, [facility] [address] [place], [place] [address] [phone]

## 2018-08-03 NOTE — Patient Instructions (Signed)
Cardioversion scheduled for Friday, January 3rd  - Arrive at the [facility] and go to admitting at 8:45AM  -Do not eat or drink anything after midnight the night prior to your procedure.  - Take all your morning medication with a sip of water prior to arrival.  - You will not be able to drive home after your procedure.

## 2018-08-07 ENCOUNTER — Ambulatory Visit (HOSPITAL_COMMUNITY): Payer: Medicare Other | Admitting: Registered Nurse

## 2018-08-07 ENCOUNTER — Encounter (HOSPITAL_COMMUNITY): Payer: Self-pay | Admitting: Anesthesiology

## 2018-08-07 ENCOUNTER — Other Ambulatory Visit: Payer: Self-pay

## 2018-08-07 ENCOUNTER — Encounter (HOSPITAL_COMMUNITY)
Admission: RE | Disposition: A | Discharge status: Home / Self Care | Payer: Self-pay | Source: Home / Self Care | Attending: Cardiology

## 2018-08-07 ENCOUNTER — Ambulatory Visit (HOSPITAL_COMMUNITY)
Admission: RE | Admit: 2018-08-07 | Discharge: 2018-08-07 | Disposition: A | Discharge status: Home / Self Care | Payer: Medicare Other | Attending: Cardiology | Admitting: Cardiology

## 2018-08-07 DIAGNOSIS — I440 Atrioventricular block, first degree: Secondary | ICD-10-CM | POA: Insufficient documentation

## 2018-08-07 DIAGNOSIS — I4892 Unspecified atrial flutter: Secondary | ICD-10-CM | POA: Diagnosis not present

## 2018-08-07 DIAGNOSIS — Z7951 Long term (current) use of inhaled steroids: Secondary | ICD-10-CM | POA: Insufficient documentation

## 2018-08-07 DIAGNOSIS — I10 Essential (primary) hypertension: Secondary | ICD-10-CM | POA: Insufficient documentation

## 2018-08-07 DIAGNOSIS — E785 Hyperlipidemia, unspecified: Secondary | ICD-10-CM | POA: Insufficient documentation

## 2018-08-07 DIAGNOSIS — Z7901 Long term (current) use of anticoagulants: Secondary | ICD-10-CM | POA: Diagnosis not present

## 2018-08-07 DIAGNOSIS — K219 Gastro-esophageal reflux disease without esophagitis: Secondary | ICD-10-CM | POA: Diagnosis not present

## 2018-08-07 DIAGNOSIS — I2510 Atherosclerotic heart disease of native coronary artery without angina pectoris: Secondary | ICD-10-CM | POA: Diagnosis not present

## 2018-08-07 DIAGNOSIS — I4891 Unspecified atrial fibrillation: Secondary | ICD-10-CM | POA: Diagnosis not present

## 2018-08-07 DIAGNOSIS — Z87891 Personal history of nicotine dependence: Secondary | ICD-10-CM | POA: Insufficient documentation

## 2018-08-07 DIAGNOSIS — I4819 Other persistent atrial fibrillation: Secondary | ICD-10-CM | POA: Diagnosis not present

## 2018-08-07 DIAGNOSIS — Z950 Presence of cardiac pacemaker: Secondary | ICD-10-CM | POA: Insufficient documentation

## 2018-08-07 HISTORY — PX: CARDIOVERSION: SHX1299

## 2018-08-07 SURGERY — CARDIOVERSION
Anesthesia: Monitor Anesthesia Care

## 2018-08-07 MED ORDER — HYDROMORPHONE HCL 1 MG/ML IJ SOLN: 0.2500 mg | INTRAMUSCULAR | Status: DC | PRN

## 2018-08-07 MED ORDER — PROMETHAZINE HCL 25 MG/ML IJ SOLN: 6.2500 mg | INTRAMUSCULAR | Status: DC | PRN

## 2018-08-07 MED ORDER — OXYCODONE HCL 5 MG/5ML PO SOLN: 5.0000 mg | Freq: Once | ORAL | Status: DC | PRN

## 2018-08-07 MED ORDER — LIDOCAINE 2% (20 MG/ML) 5 ML SYRINGE
INTRAMUSCULAR | Status: DC | PRN
  Administered 2018-08-07: 80 mg via INTRAVENOUS

## 2018-08-07 MED ORDER — MEPERIDINE HCL 100 MG/ML IJ SOLN: 6.2500 mg | INTRAMUSCULAR | Status: DC | PRN

## 2018-08-07 MED ORDER — SODIUM CHLORIDE 0.9 % IV SOLN
INTRAVENOUS | Status: DC
  Administered 2018-08-07: 09:00:00 via INTRAVENOUS

## 2018-08-07 MED ORDER — PROPOFOL 10 MG/ML IV BOLUS
INTRAVENOUS | Status: DC | PRN
  Administered 2018-08-07: 50 mg via INTRAVENOUS
  Administered 2018-08-07: 30 mg via INTRAVENOUS

## 2018-08-07 MED ORDER — OXYCODONE HCL 5 MG PO TABS: 5.0000 mg | ORAL_TABLET | Freq: Once | ORAL | Status: DC | PRN

## 2018-08-07 NOTE — Transfer of Care (Signed)
Immediate Anesthesia Transfer of Care Note  Patient: [name]  Procedure(s) Performed: CARDIOVERSION (N/A )  Patient Location: PACU and Endoscopy Unit  Anesthesia Type:MAC  Level of Consciousness: awake, alert  and oriented  Airway & Oxygen Therapy: Patient Spontanous Breathing  Post-op Assessment: Report given to RN and Post -op Vital signs reviewed and stable  Post vital signs: Reviewed and stable  Last Vitals:  Vitals Value Taken Time  BP    Temp    Pulse    Resp    SpO2      Last Pain:  Vitals:   08/07/18 0921  TempSrc:   PainSc: 0-No pain         Complications: No apparent anesthesia complications

## 2018-08-07 NOTE — Anesthesia Procedure Notes (Signed)
Date/Time: 08/07/2018 9:24 AM Performed by: Trinna Post., CRNA Pre-anesthesia Checklist: Patient identified, Emergency Drugs available, Suction available, Patient being monitored and Timeout performed Patient Re-evaluated:Patient Re-evaluated prior to induction Oxygen Delivery Method: Ambu bag Preoxygenation: Pre-oxygenation with 100% oxygen Induction Type: IV induction Placement Confirmation: positive ETCO2

## 2018-08-07 NOTE — Anesthesia Preprocedure Evaluation (Addendum)
Anesthesia Evaluation  Patient identified by MRN, date of birth, ID band Patient awake    Reviewed: Allergy & Precautions, NPO status , Patient's Chart, lab work & pertinent test results  Airway Mallampati: III       Dental no notable dental hx. (+) Teeth Intact   Pulmonary former smoker,    Pulmonary exam normal breath sounds clear to auscultation       Cardiovascular hypertension, + CAD  + dysrhythmias Atrial Fibrillation + pacemaker  Rhythm:Irregular Rate:Normal     Neuro/Psych    GI/Hepatic Neg liver ROS, GERD  Medicated,  Endo/Other  negative endocrine ROS  Renal/GU negative Renal ROS     Musculoskeletal   Abdominal Normal abdominal exam  (+)   Peds  Hematology negative hematology ROS (+)   Anesthesia Other Findings Conclusion   SURGEON:  [name], MD  PREPROCEDURE DIAGNOSES: 1. Persistent atrial fibrillation.  POSTPROCEDURE DIAGNOSES: 1. Persistent atrial fibrillation.  PROCEDURES: 1. Comprehensive electrophysiologic study. 2. Coronary sinus pacing and recording. 3. Three-dimensional mapping of atrial fibrillation (with additional mapping and ablation within the left atrium due to persistence of afib) 4. Ablation of atrial fibrillation (with additional mapping and ablation within the left atrium due to persistence of afib) 5. Intracardiac echocardiography. 6. Transseptal puncture of an intact septum. 7. Arrhythmia induction with pacing 8. External cardioversion. 9. Dual chamber pacemaker interrogation and reprogramming   INTRODUCTION:  [name] is a 73 y.o. male with a history of persistent atrial fibrillation who now presents for EP study and radiofrequency ablation.  The patient reports initially being diagnosed with atrial fibrillation after presenting with symptomatic palpitations and fatgiue.  The patient has failed medical therapy with tikosyn.   He has symptomatic bradycardia  and is s/p [employer] PPM.   The patient therefore presents today for catheter ablation of atrial fibrillation.  DESCRIPTION OF PROCEDURE:  Informed written consent was obtained, and the patient was brought to the electrophysiology lab in a fasting state.  The patient was adequately sedated with intravenous medications as outlined in the anesthesia report.  The patient's left and right groins were prepped and draped in the usual sterile fashion by the EP lab staff.  Using a percutaneous Seldinger technique, two 7-French and one 11-French hemostasis sheaths were placed into the right common femoral vein.    Catheter Placement:  A 7-French Biosense Webster Decapolar coronary sinus catheter was introduced through the right common femoral vein and advanced into the coronary sinus for recording and pacing from this location.  A 7 F Biosense Webster 3.5 mm SmartTouch ablation catheter was introduced through the right common femoral vein and advanced into the right ventricle for recording and pacing.  This catheter was then pulled back to the His bundle location.    A luminal esophageal temperature probe was placed and used for continuous monitoring of the luminal esophageal temperature throughout the procedure as well as to localize the esophagus on fluoroscopy. In addition, the esophagus was directly visualized with intracardiac echo and its positioned marked on Carto.  During ablation at the posterior wall there was limited esophageal heating noted during RF energy delivery with the maximal temperature recorded by the luminal temperature probe of < 37.5 degrees C.  Initial Measurements: The patient presented to the electrophysiology lab in atrial fibrillation.  The QRS interval was 90 msec. The average RR interval measured 1012 msec.  The patient was V paced and therefore His electrogram could not be recorded.  Intracardiac Echocardiography: A 10-French Biosense  Webster AcuNav intracardiac  echocardiography catheter was introduced through the right common femoral vein and advanced into the right atrium. Intracardiac echocardiography was performed of the left atrium, and a three-dimensional anatomical rendering of the left atrium was performed using CARTO sound technology.  The patient was noted to have a severely enlarged right atrium.  He also had a  moderately enlarged left atrium.  The interatrial septum was prominent but not aneurysmal. All 4 pulmonary veins were visualized and noted to have separate ostia.  The pulmonary veins were moderate in size.  The left atrial appendage was visualized and did not reveal thrombus.   There was no evidence of pulmonary vein stenosis.   Transseptal Puncture: The middle right common femoral vein sheath was exchanged for an 8.5 [place] SL2 transseptal sheath and transseptal access was achieved in a standard fashion using a Baylis needle with intracardiac echocardiography confirmation of the transseptal puncture.  Once transseptal access had been achieved, heparin was administered intravenously and intra- arterially in order to maintain an ACT of greater than 350 seconds throughout the procedure.    3D Mapping and Ablation: The His bundle catheter was removed and in its place a 3.5 mm [employer] Thermocool ablation catheter was advanced into the right atrium.  The transseptal sheath was pulled back into the IVC over a guidewire.  The ablation catheter was advanced across the transseptal hole using the wire as a guide.  The transseptal sheath was then re-advanced over the guidewire into the left atrium.  A duodecapolar Biosense Webster pentaray mapping catheter was introduced through the transseptal sheath and positioned over the mouth of all 4 pulmonary veins.  Three-dimensional electroanatomical mapping was performed using CARTO technology.  This demonstrated electrical activity within all four pulmonary veins at baseline. The patient  underwent successful sequential electrical isolation and anatomical encircling of all four pulmonary veins using radiofrequency current with a pentaray mapping catheter as a guide. A WACA approach was used.  Sure [employer] was used.  The target Ablation Index was 380 on the posterior wall, 400 on the roof, and 500 on the anterior wall of the left atrium.  Due to persistence of atrial fibrillation, additional left atrial mapping and ablation was performed.  A series of radiofrequency lesions were delivered along the roof and floor of the left atrium in order to create a "standard box" lesion along the posterior wall of the left atrium.  Cardioversion: The patient was then cardioverted to sinus rhythm with a single synchronized 360-J biphasic shock with cardioversion electrodes in the anterior-posterior thoracic configuration.  After a couple minute, he returned to atrial fibrillation.  A second 360 J shock was successful in terminating atrial fibrillation however he quickly return to atrial flutter.   He was observed to have intermittent sinus rhythm, atypical atrial flutter, and atrial fibrillation.  His atypical atrial flutter would quickly terminate and was too unstable for mapping or ablation today.  The procedure was therefore considered completed.   All catheters were removed, and the sheaths were aspirated and flushed.  The patient was transferred to the recovery area for sheath removal per protocol.  EBL<60ml.  There were no early apparent complications. His [employer] PPM was interrogated and right atrial and right ventricular sensing, impedance, and threshold values were unchanged when compared to preprocedure values.  His device  (previously programmed DDIR) was reprogrammed DDD today.  CONCLUSIONS: 1. Atrial fibrillation upon presentation.   2. Intracardiac echo reveals a severely enlarged right atrium  with moderately enlarged left atrium with four separate pulmonary veins  without evidence of pulmonary vein stenosis. 3. Successful electrical isolation and anatomical encircling of all four pulmonary veins with radiofrequency current.  A WACA approach was used 3. Additional left atrial ablation was performed with a standard box lesion created along the posterior wall of the left atrium 4. Atrial fibrillation cardioverted to sinus rhythm. 5. No early apparent complications.   Jeneen Rinks Allred,MD 1:13 PM 07/07/2018    Reproductive/Obstetrics                                                             Anesthesia Evaluation  Patient identified by MRN, date of birth, ID band Patient awake    Reviewed: Allergy & Precautions, H&P , NPO status , Patient's Chart, lab work & pertinent test results  Airway Mallampati: III  TM Distance: >3 FB Neck ROM: Full    Dental no notable dental hx.    Pulmonary former smoker,    Pulmonary exam normal breath sounds clear to auscultation       Cardiovascular hypertension, Pt. on medications + CAD (non-obstructive 2 vessle disease)  Normal cardiovascular exam+ dysrhythmias (s/p ablation 2006 for flutter. recent afib. RBBB) Atrial Fibrillation + pacemaker (sick sinus syndrome)  Rhythm:Regular Rate:Normal  TTE 05/2018: EF 50-55%, biatrial enlargement mild MR   Neuro/Psych Depression negative neurological ROS  negative psych ROS   GI/Hepatic Neg liver ROS, GERD  ,  Endo/Other  negative endocrine ROS  Renal/GU negative Renal ROS  negative genitourinary   Musculoskeletal negative musculoskeletal ROS (+)   Abdominal   Peds negative pediatric ROS (+)  Hematology negative hematology ROS (+)   Anesthesia Other Findings   Reproductive/Obstetrics negative OB ROS                            Anesthesia Physical  Anesthesia Plan  ASA: III  Anesthesia Plan: General   Post-op Pain Management:    Induction: Intravenous  PONV Risk Score and Plan: 2  and Ondansetron and Midazolam  Airway Management Planned: Oral ETT  Additional Equipment:   Intra-op Plan:   Post-operative Plan: Extubation in OR  Informed Consent: I have reviewed the patients History and Physical, chart, labs and discussed the procedure including the risks, benefits and alternatives for the proposed anesthesia with the patient or authorized representative who has indicated his/her understanding and acceptance.   Dental advisory given  Plan Discussed with: CRNA  Anesthesia Plan Comments:         Anesthesia Quick Evaluation                                   Anesthesia Evaluation  Patient identified by MRN, date of birth, ID band Patient awake    Reviewed: Allergy & Precautions, H&P , NPO status , Patient's Chart, lab work & pertinent test results  Airway Mallampati: III  TM Distance: >3 FB Neck ROM: Full    Dental no notable dental hx.    Pulmonary former smoker,    Pulmonary exam normal breath sounds clear to auscultation       Cardiovascular hypertension, Pt. on medications + CAD (non-obstructive 2 vessle disease)  Normal  cardiovascular exam+ dysrhythmias (s/p ablation 2006 for flutter. recent afib. RBBB) Atrial Fibrillation + pacemaker (sick sinus syndrome)  Rhythm:Regular Rate:Normal  TTE 05/2018: EF 50-55%, biatrial enlargement mild MR   Neuro/Psych Depression negative neurological ROS  negative psych ROS   GI/Hepatic Neg liver ROS, GERD  ,  Endo/Other  negative endocrine ROS  Renal/GU negative Renal ROS  negative genitourinary   Musculoskeletal negative musculoskeletal ROS (+)   Abdominal   Peds negative pediatric ROS (+)  Hematology negative hematology ROS (+)   Anesthesia Other Findings   Reproductive/Obstetrics negative OB ROS                            Anesthesia Physical  Anesthesia Plan  ASA: III  Anesthesia Plan: General   Post-op Pain Management:    Induction:  Intravenous  PONV Risk Score and Plan: 2 and Ondansetron and Midazolam  Airway Management Planned: Oral ETT  Additional Equipment:   Intra-op Plan:   Post-operative Plan: Extubation in OR  Informed Consent: I have reviewed the patients History and Physical, chart, labs and discussed the procedure including the risks, benefits and alternatives for the proposed anesthesia with the patient or authorized representative who has indicated his/her understanding and acceptance.   Dental advisory given  Plan Discussed with: CRNA  Anesthesia Plan Comments:         Anesthesia Quick Evaluation  Anesthesia Physical Anesthesia Plan  ASA: III  Anesthesia Plan: General   Post-op Pain Management:    Induction: Intravenous  PONV Risk Score and Plan:   Airway Management Planned: Natural Airway and Simple Face Mask  Additional Equipment:   Intra-op Plan:   Post-operative Plan:   Informed Consent: I have reviewed the patients History and Physical, chart, labs and discussed the procedure including the risks, benefits and alternatives for the proposed anesthesia with the patient or authorized representative who has indicated his/her understanding and acceptance.     Plan Discussed with: CRNA  Anesthesia Plan Comments:         Anesthesia Quick Evaluation

## 2018-08-07 NOTE — Anesthesia Postprocedure Evaluation (Signed)
Anesthesia Post Note  Patient: [name]  Procedure(s) Performed: CARDIOVERSION (N/A )     Patient location during evaluation: Endoscopy Anesthesia Type: General Level of consciousness: awake Pain management: pain level controlled Vital Signs Assessment: post-procedure vital signs reviewed and stable Respiratory status: spontaneous breathing Cardiovascular status: stable Postop Assessment: no apparent nausea or vomiting Anesthetic complications: no    Last Vitals:  Vitals:   08/07/18 0945 08/07/18 0953  BP: 109/71 121/68  Pulse: 63 64  Resp: 13 15  Temp:    SpO2: 96% 98%    Last Pain:  Vitals:   08/07/18 0953  TempSrc:   PainSc: 0-No pain   Pain Goal:                 Huston Foley

## 2018-08-07 NOTE — H&P (Signed)
Sherran Needs, NP  Nurse Practitioner  Electrophysiology  Progress Notes    Signed  Date of Service:  08/03/2018 1:30 PM       Related encounter: ATRAIL FIB NEW PATIENT from 08/03/2018 in [place]      Signed      Expand All Collapse All    Show:Clear all [x] Manual[x] Template[] Copied  Added by: [x] Sherran Needs, NP  [] Hover for details   Primary Care Physician: [name], MD Referring Physician: Dr. Stark Falls is a 73 y.o. male with a h/o persistent afib, that is in the afib clinic for f/u ablation one month ago. He left from the hospital in afib and interrogation today reveals persistent afib, rate controlled, since the procedure, despite being on dofetilide. Pt can not tell if he is in or out of afib.  Today, he denies symptoms of palpitations, chest pain, shortness of breath, orthopnea, PND, lower extremity edema, dizziness, presyncope, syncope, or neurologic sequela. The patient is tolerating medications without difficulties and is otherwise without complaint today.       Past Medical History:  Diagnosis Date  . 1st degree AV block   . Atrial flutter ([place])    s/p ablation 2006  . CAD (coronary artery disease)    Mild 2 vessel disease per cath in 2006; myoview neg 2011  . Chronic anticoagulation    on Xarelto - started 11/12/11  . Depression 11-05-11   On Cymbalta  . GERD (gastroesophageal reflux disease)   . Hyperlipidemia   . Hypertension   . Normal nuclear stress test 2011  . Persistent atrial fibrillation    recent new occurrence 3/13>>Tikosyn  . PVC's (premature ventricular contractions)   . Right ventricular enlargement    Mild noted echo 3/13  . Sick sinus syndrome ([place]) 11/23/2012   Dr Caryl Comes  . Tobacco abuse         Past Surgical History:  Procedure Laterality Date  . ABLATION OF DYSRHYTHMIC FOCUS  07/07/2018  . ATRIAL FIBRILLATION ABLATION N/A 07/07/2018   Procedure:  ATRIAL FIBRILLATION ABLATION;  Surgeon: [name], MD;  Location: [place] [facility];  Service: Cardiovascular;  Laterality: N/A;  . CARDIAC CATHETERIZATION  11/1988, 12/07/2004   showing essentially normal left  ventricular function, moderate two-vessel disease (50% LAD after the third diagonal and a 60+% stenosis in the second obtuse marginal)         . CARDIOVERSION N/A 12/05/2011   Procedure: CARDIOVERSION;  Surgeon: Deboraha Sprang, MD;  Location[facility] CATH LAB;  Service: Cardiovascular;  Laterality: N/A;  . FOOT SURGERY  1996   left-retained hardware  . HERNIA REPAIR    . INGUINAL HERNIA REPAIR  01/28/2008   Recurrent right inguinal hernia Laparoscopic, preperitoneal repair of recurrent right inguinal hernia with mesh -- SURGEON:  Dr. Fanny Skates.  . INGUINAL HERNIA REPAIR  11/06/2011   Procedure: HERNIA REPAIR INGUINAL ADULT;  Surgeon: Adin Hector, MD;  Location: [facility];  Service: General;  Laterality: Left;  . PACEMAKER INSERTION  11/23/2012   Dr Caryl Comes  . PERMANENT PACEMAKER INSERTION N/A 11/23/2012   Procedure: PERMANENT PACEMAKER INSERTION;  Surgeon: Deboraha Sprang, MD;  Location: [facility] CATH LAB;  Service: Cardiovascular;  Laterality: N/A;  . TEE WITH CARDIOVERSION  2001, 2006   Invasive electrophysiology study, electroanatomical mapping,and radiofrequency catheter ablation. -- patient's arrhythmia was isthmus dependent flutter not withstanding the very long cycle length.  RF energy delivered across the cavotricuspid isthmus successfully  interrupted conduction across the isthmus and eliminated the patient's sub-straight. PHYSICIAN:  Deboraha Sprang, M.D  . TEE WITHOUT CARDIOVERSION  12/31/2011   Procedure: TRANSESOPHAGEAL ECHOCARDIOGRAM (TEE);  Surgeon: Josue Hector, MD;  Location: [facility] ENDOSCOPY;  Service: Cardiovascular;  Laterality: N/A;  tba after for tikosyn  . TEE WITHOUT CARDIOVERSION N/A 07/07/2018   Procedure: TRANSESOPHAGEAL ECHOCARDIOGRAM (TEE);  Surgeon:  Acie Fredrickson Wonda Cheng, MD;  Location: [facility] ENDOSCOPY;  Service: Cardiovascular;  Laterality: N/A;  . VASECTOMY            Current Outpatient Medications  Medication Sig Dispense Refill  . cetirizine (ZYRTEC) 10 MG tablet Take 10 mg by mouth daily.    [name] dofetilide (TIKOSYN) 500 MCG capsule TAKE 1 CAPSULE TWICE A DAY ( KEEP UPCOMING APPOINTMENT IN SEPTEMBER FOR FUTURE REFILLS ) (Patient taking differently: Take 500 mcg by mouth 2 (two) times daily. ) 180 capsule 3  . fluticasone (FLONASE) 50 MCG/ACT nasal spray Place 1-2 sprays into both nostrils at bedtime.     . furosemide (LASIX) 40 MG tablet Take 3 tablets (120 mg total) by mouth daily. 270 tablet 3  . metolazone (ZAROXOLYN) 2.5 MG tablet Take 1 tablet (2.5 mg total) by mouth once a week. Please keep upcoming appt with Dr. Caryl Comes in September before anymore refills. Thank you (Patient taking differently: Take 2.5 mg by mouth every 14 (fourteen) days. Please keep upcoming appt with Dr. Caryl Comes in September before anymore refills. Thank you) 5 tablet 1  . OVER THE COUNTER MEDICATION Take 20 drops by mouth daily as needed (pain, mood).     [name] OVER THE COUNTER MEDICATION Take 1 tablet by mouth daily.    [name] OVER THE COUNTER MEDICATION Take 1 Scoop by mouth daily.    . pantoprazole (PROTONIX) 40 MG tablet Take 1 tablet (40 mg total) by mouth daily. 45 tablet 0  . rivaroxaban (XARELTO) 20 MG TABS tablet Take 20 mg by mouth every morning.     [name] VIAGRA 100 MG tablet Take 1 tablet by mouth as needed for erectile dysfunction.     No current facility-administered medications for this encounter.         Allergies  Allergen Reactions  . Horse-Derived Products Hives    Social History        Socioeconomic History  . Marital status: Widowed    Spouse name: Not on file  . Number of children: Not on file  . Years of education: Not on file  . Highest education level: Not on file  Occupational History  . Not on file  Social Needs    . Financial resource strain: Not on file  . Food insecurity:    Worry: Not on file    Inability: Not on file  . Transportation needs:    Medical: Not on file    Non-medical: Not on file  Tobacco Use  . Smoking status: Former Smoker    Years: 4.00    Types: Cigarettes    Last attempt to quit: 08/05/1966    Years since quitting: 52.0  . Smokeless tobacco: Never Used  Substance and Sexual Activity  . Alcohol use: Yes    Alcohol/week: 2.0 standard drinks    Types: 2 Shots of liquor per week    Comment: DAILY  . Drug use: No  . Sexual activity: Not Currently  Lifestyle  . Physical activity:    Days per week: Not on file    Minutes per session: Not on file  .  Stress: Not on file  Relationships  . Social connections:    Talks on phone: Not on file    Gets together: Not on file    Attends religious service: Not on file    Active member of club or organization: Not on file    Attends meetings of clubs or organizations: Not on file    Relationship status: Not on file  . Intimate partner violence:    Fear of current or ex partner: Not on file    Emotionally abused: Not on file    Physically abused: Not on file    Forced sexual activity: Not on file  Other Topics Concern  . Not on file  Social History Narrative  . Not on file         Family History  Problem Relation Age of Onset  . Heart attack Father 40  . Cancer Father        colon  . Kidney disease Father        End Stage  . Coronary artery disease Father        Had Valve Replacement  . Atrial fibrillation Mother        flutter  . Heart failure Mother 49  . Cancer Sister        colon  . Cancer Paternal Uncle        lung    ROS- All systems are reviewed and negative except as per the HPI above  Physical Exam:    Vitals:   08/03/18 1326  Weight: 94.3 kg  Height: 6' (1.829 m)      Wt Readings from Last 3 Encounters:  08/03/18 94.3 kg   07/08/18 91.9 kg  06/12/18 92.4 kg    Labs: RecentLabs       Lab Results  Component Value Date   NA 137 07/08/2018   K 3.6 07/08/2018   CL 99 07/08/2018   CO2 28 07/08/2018   GLUCOSE 144 (H) 07/08/2018   BUN 13 07/08/2018   CREATININE 0.96 07/08/2018   CALCIUM 8.8 (L) 07/08/2018   MG 2.0 07/08/2018     RecentLabs       Lab Results  Component Value Date   INR 1.2 (H) 11/19/2012     RecentLabs  Lab Results  Component Value Date   CHOL 212 (H) 10/26/2012   HDL 65.50 10/26/2012   TRIG 66.0 10/26/2012       GEN- The patient is well appearing, alert and oriented x 3 today.   Head- normocephalic, atraumatic Eyes-  Sclera clear, conjunctiva pink Ears- hearing intact Oropharynx- clear Neck- supple, no JVP Lymph- no cervical lymphadenopathy Lungs- Clear to ausculation bilaterally, normal work of breathing Heart- Slightly irregular rate and rhythm, no murmurs, rubs or gallops, PMI not laterally displaced GI- soft, NT, ND, + BS Extremities- no clubbing, cyanosis, or edema MS- no significant deformity or atrophy Skin- no rash or lesion Psych- euthymic mood, full affect Neuro- strength and sensation are intact  EKG-afib at 78 bpm, QRS int 146 ms, QTc 501 ms Epic records reviewed Brief interrogation shows persistent afib since ablation   Assessment and Plan: 1. Persistent afib He is rate controlled and asymptomatic  Discussed cardioversion with pt, risks and benefit and he is willing to proceed States no missed xarelto  for at least the last 3 weeks Continue dofetilide 500 mcg bid  Cardioversion scheduled for 1/3 Bmet, cbc today  F/u here in one week  Butch Penny C. Carroll, [place]  [facility] [place], [place] [address] [phone]          For DCCV; no changes; compliant with xarelto. [name]

## 2018-08-07 NOTE — Interval H&P Note (Signed)
History and Physical Interval Note:  08/07/2018 9:16 AM  [name]  has presented today for surgery, with the diagnosis of A-FIB  The various methods of treatment have been discussed with the patient and family. After consideration of risks, benefits and other options for treatment, the patient has consented to  Procedure(s): CARDIOVERSION (N/A) as a surgical intervention .  The patient's history has been reviewed, patient examined, no change in status, stable for surgery.  I have reviewed the patient's chart and labs.  Questions were answered to the patient's satisfaction.     [name]

## 2018-08-07 NOTE — Procedures (Addendum)
Electrical Cardioversion Procedure Note [name] [id number] 03-Nov-1945  Procedure: Electrical Cardioversion Indications:  Atrial Fibrillation  Procedure Details Consent: Risks of procedure as well as the alternatives and risks of each were explained to the (patient/caregiver).  Consent for procedure obtained. Time Out: Verified patient identification, verified procedure, site/side was marked, verified correct patient position, special equipment/implants available, medications/allergies/relevent history reviewed, required imaging and test results available.  Performed  Patient placed on cardiac monitor, pulse oximetry, supplemental oxygen as necessary.  Sedation given: Pt sedated by anesthesia with lidocaine 80 mg and diprovan 80 mg IV. Pacer pads placed anterior and posterior chest.  Cardioverted 1 time(s).  Cardioverted at 120J.  Evaluation Findings: Post procedure EKG shows: AV pacing Complications: None Patient did tolerate procedure well.  Following DCCV, pt developed recurrent episodes of short bursts of atrial flutter but would then convert. Will continue tikosyn; fu EP.   [name] 08/07/2018, 9:17 AM

## 2018-08-09 ENCOUNTER — Encounter (HOSPITAL_COMMUNITY): Payer: Self-pay | Admitting: Cardiology

## 2018-08-14 ENCOUNTER — Encounter (HOSPITAL_COMMUNITY): Payer: Self-pay | Admitting: Nurse Practitioner

## 2018-08-14 ENCOUNTER — Ambulatory Visit (HOSPITAL_COMMUNITY)
Admission: RE | Admit: 2018-08-14 | Discharge: 2018-08-14 | Disposition: A | Discharge status: Home / Self Care | Payer: Medicare Other | Source: Ambulatory Visit | Attending: Nurse Practitioner | Admitting: Nurse Practitioner

## 2018-08-14 VITALS — BP 124/72 | HR 79 | Ht 72.0 in | Wt 206.4 lb

## 2018-08-14 DIAGNOSIS — I4819 Other persistent atrial fibrillation: Secondary | ICD-10-CM | POA: Diagnosis not present

## 2018-08-14 DIAGNOSIS — Z87891 Personal history of nicotine dependence: Secondary | ICD-10-CM | POA: Insufficient documentation

## 2018-08-14 DIAGNOSIS — Z79899 Other long term (current) drug therapy: Secondary | ICD-10-CM | POA: Diagnosis not present

## 2018-08-14 DIAGNOSIS — I10 Essential (primary) hypertension: Secondary | ICD-10-CM | POA: Diagnosis not present

## 2018-08-14 DIAGNOSIS — I495 Sick sinus syndrome: Secondary | ICD-10-CM | POA: Insufficient documentation

## 2018-08-14 DIAGNOSIS — Z8249 Family history of ischemic heart disease and other diseases of the circulatory system: Secondary | ICD-10-CM | POA: Diagnosis not present

## 2018-08-14 DIAGNOSIS — K219 Gastro-esophageal reflux disease without esophagitis: Secondary | ICD-10-CM | POA: Insufficient documentation

## 2018-08-14 DIAGNOSIS — E785 Hyperlipidemia, unspecified: Secondary | ICD-10-CM | POA: Diagnosis not present

## 2018-08-14 DIAGNOSIS — Z7901 Long term (current) use of anticoagulants: Secondary | ICD-10-CM | POA: Diagnosis not present

## 2018-08-14 DIAGNOSIS — I2510 Atherosclerotic heart disease of native coronary artery without angina pectoris: Secondary | ICD-10-CM | POA: Insufficient documentation

## 2018-08-14 NOTE — Progress Notes (Addendum)
Primary Care Physician: [name], MD Referring Physician: Dr. Stark Falls is a 73 y.o. male with a h/o persistent afib, that is in the afib clinic for f/u ablation one month ago. He left from the hospital in afib and interrogation today reveals persistent afib, rate controlled, since the procedure, despite being on dofetilide. Pt can not tell if he is in or out of afib.  F/u in afib clinic s/p successful cardioversion, 1/3. However, he is back in afib, he is unsure when he returned. He does say before the cardioversion , he was drinking alcohol "a lot." Has not had anything to drink since 1/1. Continues on tikosyn.   Today, he denies symptoms of palpitations, chest pain, shortness of breath, orthopnea, PND, lower extremity edema, dizziness, presyncope, syncope, or neurologic sequela. The patient is tolerating medications without difficulties and is otherwise without complaint today.   Past Medical History:  Diagnosis Date  . 1st degree AV block   . Atrial flutter ([place])    s/p ablation 2006  . CAD (coronary artery disease)    Mild 2 vessel disease per cath in 2006; myoview neg 2011  . Chronic anticoagulation    on Xarelto - started 11/12/11  . Depression 11-05-11   On Cymbalta  . GERD (gastroesophageal reflux disease)   . Hyperlipidemia   . Hypertension   . Normal nuclear stress test 2011  . Persistent atrial fibrillation    recent new occurrence 3/13>>Tikosyn  . PVC's (premature ventricular contractions)   . Right ventricular enlargement    Mild noted echo 3/13  . Sick sinus syndrome ([place]) 11/23/2012   Dr Caryl Comes  . Tobacco abuse    Past Surgical History:  Procedure Laterality Date  . ABLATION OF DYSRHYTHMIC FOCUS  07/07/2018  . ATRIAL FIBRILLATION ABLATION N/A 07/07/2018   Procedure: ATRIAL FIBRILLATION ABLATION;  Surgeon: [name], MD;  Location: [place] CV LAB;  Service: Cardiovascular;  Laterality: N/A;  . CARDIAC CATHETERIZATION  11/1988, 12/07/2004   showing essentially normal left  ventricular function, moderate two-vessel disease (50% LAD after the third diagonal and a 60+% stenosis in the second obtuse marginal)         . CARDIOVERSION N/A 12/05/2011   Procedure: CARDIOVERSION;  Surgeon: Deboraha Sprang, MD;  Location: [facility] CATH LAB;  Service: Cardiovascular;  Laterality: N/A;  . CARDIOVERSION N/A 08/07/2018   Procedure: CARDIOVERSION;  Surgeon: Lelon Perla, MD;  Location: [place];  Service: Cardiovascular;  Laterality: N/A;  . [place]   left-retained hardware  . HERNIA REPAIR    . INGUINAL HERNIA REPAIR  01/28/2008   Recurrent right inguinal hernia Laparoscopic, preperitoneal repair of recurrent right inguinal hernia with mesh -- SURGEON:  Dr. Fanny Skates.  . INGUINAL HERNIA REPAIR  11/06/2011   Procedure: HERNIA REPAIR INGUINAL ADULT;  Surgeon: Adin Hector, MD;  Location: [facility];  Service: General;  Laterality: Left;  . PACEMAKER INSERTION  11/23/2012   Dr Caryl Comes  . PERMANENT PACEMAKER INSERTION N/A 11/23/2012   Procedure: PERMANENT PACEMAKER INSERTION;  Surgeon: Deboraha Sprang, MD;  Location: [facility] CATH LAB;  Service: Cardiovascular;  Laterality: N/A;  . TEE WITH CARDIOVERSION  2001, 2006   Invasive electrophysiology study, electroanatomical mapping,and radiofrequency catheter ablation. -- patient's arrhythmia was isthmus dependent flutter not withstanding the very long cycle length.  RF energy delivered across the cavotricuspid isthmus successfully interrupted conduction across the isthmus and eliminated the patient's sub-straight. PHYSICIAN:  Deboraha Sprang, M.D  .  TEE WITHOUT CARDIOVERSION  12/31/2011   Procedure: TRANSESOPHAGEAL ECHOCARDIOGRAM (TEE);  Surgeon: Josue Hector, MD;  Location: [facility] ENDOSCOPY;  Service: Cardiovascular;  Laterality: N/A;  tba after for tikosyn  . TEE WITHOUT CARDIOVERSION N/A 07/07/2018   Procedure: TRANSESOPHAGEAL ECHOCARDIOGRAM (TEE);  Surgeon: Acie Fredrickson Wonda Cheng, MD;  Location: [facility]  ENDOSCOPY;  Service: Cardiovascular;  Laterality: N/A;  . VASECTOMY      Current Outpatient Medications  Medication Sig Dispense Refill  . cetirizine (ZYRTEC) 10 MG tablet Take 10 mg by mouth daily.    . diphenhydrAMINE (BENADRYL) 25 MG tablet Take 50 mg by mouth at bedtime.    . dofetilide (TIKOSYN) 500 MCG capsule TAKE 1 CAPSULE TWICE A DAY ( KEEP UPCOMING APPOINTMENT IN SEPTEMBER FOR FUTURE REFILLS ) (Patient taking differently: Take 500 mcg by mouth 2 (two) times daily. ) 180 capsule 3  . fluticasone (FLONASE) 50 MCG/ACT nasal spray Place 1-2 sprays into both nostrils at bedtime.     . furosemide (LASIX) 40 MG tablet Take 3 tablets (120 mg total) by mouth daily. 270 tablet 3  . metolazone (ZAROXOLYN) 2.5 MG tablet Take 1 tablet (2.5 mg total) by mouth once a week. Please keep upcoming appt with Dr. Caryl Comes in September before anymore refills. Thank you (Patient taking differently: Take 2.5 mg by mouth See admin instructions. Take 2.5 mg by mouth once a week as needed for swelling) 5 tablet 1  . OVER THE COUNTER MEDICATION Take 1 Dose by mouth daily as needed (pain, mood).     [name] OVER THE COUNTER MEDICATION Take 1 tablet by mouth daily.    [name] OVER THE COUNTER MEDICATION Take 1 Scoop by mouth daily.    . pantoprazole (PROTONIX) 40 MG tablet Take 1 tablet (40 mg total) by mouth daily. 45 tablet 0  . rivaroxaban (XARELTO) 20 MG TABS tablet Take 20 mg by mouth every morning.     [name] VIAGRA 100 MG tablet Take 100 mg by mouth as needed for erectile dysfunction.      No current facility-administered medications for this encounter.     Allergies  Allergen Reactions  . Horse-Derived Products Hives    Social History   Socioeconomic History  . Marital status: Widowed    Spouse name: Not on file  . Number of children: Not on file  . Years of education: Not on file  . Highest education level: Not on file  Occupational History  . Not on file  Social Needs  . Financial resource strain: Not on  file  . Food insecurity:    Worry: Not on file    Inability: Not on file  . Transportation needs:    Medical: Not on file    Non-medical: Not on file  Tobacco Use  . Smoking status: Former Smoker    Years: 4.00    Types: Cigarettes    Last attempt to quit: 08/05/1966    Years since quitting: 52.0  . Smokeless tobacco: Never Used  Substance and Sexual Activity  . Alcohol use: Yes    Alcohol/week: 2.0 standard drinks    Types: 2 Shots of liquor per week    Comment: DAILY  . Drug use: No  . Sexual activity: Not Currently  Lifestyle  . Physical activity:    Days per week: Not on file    Minutes per session: Not on file  . Stress: Not on file  Relationships  . Social connections:    Talks on phone: Not on file  Gets together: Not on file    Attends religious service: Not on file    Active member of club or organization: Not on file    Attends meetings of clubs or organizations: Not on file    Relationship status: Not on file  . Intimate partner violence:    Fear of current or ex partner: Not on file    Emotionally abused: Not on file    Physically abused: Not on file    Forced sexual activity: Not on file  Other Topics Concern  . Not on file  Social History Narrative  . Not on file    Family History  Problem Relation Age of Onset  . Heart attack Father 61  . Cancer Father        colon  . Kidney disease Father        End Stage  . Coronary artery disease Father        Had Valve Replacement  . Atrial fibrillation Mother        flutter  . Heart failure Mother 51  . Cancer Sister        colon  . Cancer Paternal Uncle        lung    ROS- All systems are reviewed and negative except as per the HPI above  Physical Exam: Vitals:   08/14/18 1015  BP: 124/72  Pulse: 79  Weight: 93.6 kg  Height: 6' (1.829 m)   Wt Readings from Last 3 Encounters:  08/14/18 93.6 kg  08/07/18 89.9 kg  08/03/18 94.3 kg    Labs: Lab Results  Component Value Date   NA 141  08/03/2018   K 4.4 08/03/2018   CL 101 08/03/2018   CO2 31 08/03/2018   GLUCOSE 109 (H) 08/03/2018   BUN 17 08/03/2018   CREATININE 0.89 08/03/2018   CALCIUM 9.3 08/03/2018   MG 2.0 07/08/2018   Lab Results  Component Value Date   INR 1.2 (H) 11/19/2012   Lab Results  Component Value Date   CHOL 212 (H) 10/26/2012   HDL 65.50 10/26/2012   TRIG 66.0 10/26/2012     GEN- The patient is well appearing, alert and oriented x 3 today.   Head- normocephalic, atraumatic Eyes-  Sclera clear, conjunctiva pink Ears- hearing intact Oropharynx- clear Neck- supple, no JVP Lymph- no cervical lymphadenopathy Lungs- Clear to ausculation bilaterally, normal work of breathing Heart-   irregular rate and rhythm, no murmurs, rubs or gallops, PMI not laterally displaced GI- soft, NT, ND, + BS Extremities- no clubbing, cyanosis, or edema MS- no significant deformity or atrophy Skin- no rash or lesion Psych- euthymic mood, full affect Neuro- strength and sensation are intact  EKG-afib at 79 bpm, QRS int 148 ms, QTc 502 ms Epic records reviewed   Assessment and Plan: 1. Persistent afib He is rate controlled and asymptomatic   Continue on xarelto without interruption  for the 4 weeks following cardioversion  Continue dofetilide 500 mcg bid   Avoid alcohol  I will ask Dr. Rayann Heman for the most appropriate treatment going forward as he has persistent afib s/p recent ablation and ERAF form cardioversion despite Tikosyn use  Addendum- 08/17/2018- I discussed with Dr. Rayann Heman and he recommends to not to pursue SR further at at this time, to see if healing more from the procedure will assist in returning SR. nI will move up March appointment to February.  Geroge Baseman [facility] [place],  [place] [address] [phone]

## 2018-08-17 NOTE — Addendum Note (Signed)
Encounter addended by: Sherran Needs, NP on: 08/17/2018 10:26 AM  Actions taken: Clinical Note Signed

## 2018-08-22 LAB — CUP PACEART REMOTE DEVICE CHECK
Brady Statistic RA Percent Paced: 1 %
Brady Statistic RV Percent Paced: 75 %
Date Time Interrogation Session: 20191202055200
Implantable Lead Implant Date: 20140421
Implantable Lead Implant Date: 20140421
Implantable Lead Location: 753859
Implantable Lead Location: 753860
Implantable Lead Model: 5076
Implantable Pulse Generator Implant Date: 20140421
Lead Channel Impedance Value: 521 Ohm
Lead Channel Impedance Value: 674 Ohm
Lead Channel Pacing Threshold Amplitude: 0.9 V
Lead Channel Pacing Threshold Pulse Width: 0.4 ms
Lead Channel Pacing Threshold Pulse Width: 0.4 ms
Lead Channel Setting Pacing Amplitude: 1.4 V
Lead Channel Setting Pacing Pulse Width: 0.4 ms
Lead Channel Setting Sensing Sensitivity: 2.5 mV
MDC IDC MSMT BATTERY REMAINING LONGEVITY: 54 mo
MDC IDC MSMT BATTERY REMAINING PERCENTAGE: 60 %
MDC IDC MSMT LEADCHNL RA PACING THRESHOLD AMPLITUDE: 0.6 V
MDC IDC SET LEADCHNL RA PACING AMPLITUDE: 2 V
Pulse Gen Serial Number: 112305

## 2018-09-23 ENCOUNTER — Ambulatory Visit (INDEPENDENT_AMBULATORY_CARE_PROVIDER_SITE_OTHER): Payer: Medicare Other | Admitting: Internal Medicine

## 2018-09-23 ENCOUNTER — Encounter: Payer: Self-pay | Admitting: Internal Medicine

## 2018-09-23 VITALS — BP 124/70 | HR 71 | Ht 72.0 in | Wt 208.0 lb

## 2018-09-23 DIAGNOSIS — I4819 Other persistent atrial fibrillation: Secondary | ICD-10-CM

## 2018-09-23 DIAGNOSIS — Z01812 Encounter for preprocedural laboratory examination: Secondary | ICD-10-CM | POA: Diagnosis not present

## 2018-09-23 NOTE — Progress Notes (Signed)
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 male who presents today for routine electrophysiology followup.  Since his recent afib ablation, the patient reports doing very well.  he denies procedure related complications.  He has had ERAF.  + mild SOB.  Today, he denies symptoms of palpitations, chest pain,  lower extremity edema, dizziness, presyncope, or syncope.  The patient is otherwise without complaint today.   Past Medical History:  Diagnosis Date  . 1st degree AV block   . Atrial flutter ([place])    s/p ablation 2006  . CAD (coronary artery disease)    Mild 2 vessel disease per cath in 2006; myoview neg 2011  . Chronic anticoagulation    on Xarelto - started 11/12/11  . Depression 11-05-11   On Cymbalta  . GERD (gastroesophageal reflux disease)   . Hyperlipidemia   . Hypertension   . Normal nuclear stress test 2011  . Persistent atrial fibrillation    recent new occurrence 3/13>>Tikosyn  . PVC's (premature ventricular contractions)   . Right ventricular enlargement    Mild noted echo 3/13  . Sick sinus syndrome ([place]) 11/23/2012   Dr Caryl Comes  . Tobacco abuse    Past Surgical History:  Procedure Laterality Date  . ABLATION OF DYSRHYTHMIC FOCUS  07/07/2018  . ATRIAL FIBRILLATION ABLATION N/A 07/07/2018   Procedure: ATRIAL FIBRILLATION ABLATION;  Surgeon: [name], MD;  Location: [facility];  Service: Cardiovascular;  Laterality: N/A;  . CARDIAC CATHETERIZATION  11/1988, 12/07/2004   showing essentially normal left  ventricular function, moderate two-vessel disease (50% LAD after the third diagonal and a 60+% stenosis in the second obtuse marginal)         . CARDIOVERSION N/A 12/05/2011   Procedure: CARDIOVERSION;  Surgeon: Deboraha Sprang, MD;  Location: [facility] C[facility] LAB;  Service: Cardiovascular;  Laterality: N/A;  . CARDIOVERSION N/A 08/07/2018   Procedure: CARDIOVERSION;  Surgeon: Lelon Perla, MD;  Location: [facility];  Service:  Cardiovascular;  Laterality: N/A;  . [place]   left-retained hardware  . HERNIA REPAIR    . INGUINAL HERNIA REPAIR  01/28/2008   Recurrent right inguinal hernia Laparoscopic, preperitoneal repair of recurrent right inguinal hernia with mesh -- SURGEON:  Dr. Fanny Skates.  . INGUINAL HERNIA REPAIR  11/06/2011   Procedure: HERNIA REPAIR INGUINAL ADULT;  Surgeon: Adin Hector, MD;  Location: [facility];  Service: General;  Laterality: Left;  . PACEMAKER INSERTION  11/23/2012   Dr Caryl Comes  . PERMANENT PACEMAKER INSERTION N/A 11/23/2012   Procedure: PERMANENT PACEMAKER INSERTION;  Surgeon: Deboraha Sprang, MD;  Location: [facility] CATH LAB;  Service: Cardiovascular;  Laterality: N/A;  . TEE WITH CARDIOVERSION  2001, 2006   Invasive electrophysiology study, electroanatomical mapping,and radiofrequency catheter ablation. -- patient's arrhythmia was isthmus dependent flutter not withstanding the very long cycle length.  RF energy delivered across the cavotricuspid isthmus successfully interrupted conduction across the isthmus and eliminated the patient's sub-straight. PHYSICIAN:  Deboraha Sprang, M.D  . TEE WITHOUT CARDIOVERSION  12/31/2011   Procedure: TRANSESOPHAGEAL ECHOCARDIOGRAM (TEE);  Surgeon: Josue Hector, MD;  Location: [facility] ENDOSCOPY;  Service: Cardiovascular;  Laterality: N/A;  tba after for tikosyn  . TEE WITHOUT CARDIOVERSION N/A 07/07/2018   Procedure: TRANSESOPHAGEAL ECHOCARDIOGRAM (TEE);  Surgeon: Acie Fredrickson Wonda Cheng, MD;  Location: [facility] ENDOSCOPY;  Service: Cardiovascular;  Laterality: N/A;  . VASECTOMY      ROS- all systems are personally  reviewed and negatives except as per HPI above  Current Outpatient Medications  Medication Sig Dispense Refill  . cetirizine (ZYRTEC) 10 MG tablet Take 10 mg by mouth daily.    . diphenhydrAMINE (BENADRYL) 25 MG tablet Take 50 mg by mouth at bedtime.    . dofetilide (TIKOSYN) 500 MCG capsule TAKE 1 CAPSULE TWICE A DAY ( KEEP UPCOMING APPOINTMENT IN  SEPTEMBER FOR FUTURE REFILLS ) (Patient taking differently: Take 500 mcg by mouth 2 (two) times daily. ) 180 capsule 3  . fluticasone (FLONASE) 50 MCG/ACT nasal spray Place 1-2 sprays into both nostrils at bedtime.     . furosemide (LASIX) 40 MG tablet Take 3 tablets (120 mg total) by mouth daily. 270 tablet 3  . metolazone (ZAROXOLYN) 2.5 MG tablet Take 1 tablet (2.5 mg total) by mouth once a week. Please keep upcoming appt with Dr. Caryl Comes in September before anymore refills. Thank you (Patient taking differently: Take 2.5 mg by mouth See admin instructions. Take 2.5 mg by mouth once a week as needed for swelling) 5 tablet 1  . OVER THE COUNTER MEDICATION Take 1 Dose by mouth daily as needed (pain, mood).     [name] OVER THE COUNTER MEDICATION Take 1 tablet by mouth daily.    [name] OVER THE COUNTER MEDICATION Take 1 Scoop by mouth daily.    . pantoprazole (PROTONIX) 40 MG tablet Take 1 tablet (40 mg total) by mouth daily. 45 tablet 0  . rivaroxaban (XARELTO) 20 MG TABS tablet Take 20 mg by mouth every morning.     [name] VIAGRA 100 MG tablet Take 100 mg by mouth as needed for erectile dysfunction.      No current facility-administered medications for this visit.     Physical Exam: Vitals:   09/23/18 1451  BP: 124/70  Pulse: 71  SpO2: 97%  Weight: 208 lb (94.3 kg)  Height: 6' (1.829 m)    GEN- The patient is well appearing, alert and oriented x 3 today.   Head- normocephalic, atraumatic Eyes-  Sclera clear, conjunctiva pink Ears- hearing intact Oropharynx- clear Lungs- Clear to ausculation bilaterally, normal work of breathing Heart- irregular rate and rhythm, no murmurs, rubs or gallops, PMI not laterally displaced GI- soft, NT, ND, + BS Extremities- no clubbing, cyanosis, or edema  EKG tracing ordered today is personally reviewed and shows atypical atrial flutter, RBBB, Qtc 502 msec  Assessment and Plan:  1. Persistent atrial fibrillation Now in atypical atrial flutter s/p recent AF  ablation He has severe RA enlargement and at least moderate LA enlargement  chads2vasc score is 3 He is on tikosyn and xarelto He will proceed with cardioversion at the end of his 3 month healing phase post ablation (in 2 weeks).  I will see him in about 6 weeks.  IF he has further afib, our options are chronic rate control, amiodarone, or consideration of convergent procedure or MAZE.  Given his severe atriopathy, I would not advise additional endovascular ablation.  Return to see me in 6 weeks  [name] MD, [facility] 09/23/2018 3:10 PM

## 2018-09-23 NOTE — Patient Instructions (Addendum)
Medication Instructions:  Your physician recommends that you continue on your current medications as directed. Please refer to the Current Medication list given to you today.  *If you need a refill on your cardiac medications before your next appointment, please call your pharmacy*  Labwork: Pre procedure labs today: BMET & CBC  Testing/Procedures: Your physician has recommended that you have a Cardioversion (DCCV). Electrical Cardioversion uses a jolt of electricity to your heart either through paddles or wired patches attached to your chest. This is a controlled, usually prescheduled, procedure. Defibrillation is done under light anesthesia in the hospital, and you usually go home the day of the procedure. This is done to get your heart back into a normal rhythm. You are not awake for the procedure.   CARDIOVERSION INSTRUCTIONS: Please arrive at the [facility] main entrance of [facility] hospital on 10/09/18 at 8:00 a.m. Do not eat or drink after midnight prior to procedure Hold any diuretics the morning of this procedure. Take your remaining medications as normal the morning of your procedure with a sip of water. You will need someone to drive you home at discharge   Follow-Up: Remote monitoring is used to monitor your Pacemaker or ICD from home. This monitoring reduces the number of office visits required to check your device to one time per year. It allows [place] to keep an eye on the functioning of your device to ensure it is working properly. You are scheduled for a device check from home on 10/05/2018. You may send your transmission at any time that day. If you have a wireless device, the transmission will be sent automatically. After your physician reviews your transmission, you will receive a postcard with your next transmission date.  Your physician recommends that you schedule a follow-up appointment in: April with Dr. Rayann Heman.   Thank you for choosing [facility]!!    Any Other  Special Instructions Will Be Listed Below (If Applicable).   Electrical Cardioversion  Electrical cardioversion is the delivery of a jolt of electricity to restore a normal rhythm to the heart. A rhythm that is too fast or is not regular keeps the heart from pumping well. In this procedure, sticky patches or metal paddles are placed on the chest to deliver electricity to the heart from a device. This procedure may be done in an emergency if:  There is low or no blood pressure as a result of the heart rhythm.  Normal rhythm must be restored as fast as possible to protect the brain and heart from further damage.  It may save a life. This procedure may also be done for irregular or fast heart rhythms that are not immediately life-threatening. Tell a health care provider about:  Any allergies you have.  All medicines you are taking, including vitamins, herbs, eye drops, creams, and over-the-counter medicines.  Any problems you or family members have had with anesthetic medicines.  Any blood disorders you have.  Any surgeries you have had.  Any medical conditions you have.  Whether you are pregnant or may be pregnant. What are the risks? Generally, this is a safe procedure. However, problems may occur, including:  Allergic reactions to medicines.  A blood clot that breaks free and travels to other parts of your body.  The possible return of an abnormal heart rhythm within hours or days after the procedure.  Your heart stopping (cardiac arrest). This is rare. What happens before the procedure? Medicines  Your health care provider may have you start  taking: ? Blood-thinning medicines (anticoagulants) so your blood does not clot as easily. ? Medicines may be given to help stabilize your heart rate and rhythm.  Ask your health care provider about changing or stopping your regular medicines. This is especially important if you are taking diabetes medicines or blood  thinners. General instructions  Plan to have someone take you home from the hospital or clinic.  If you will be going home right after the procedure, plan to have someone with you for 24 hours.  Follow instructions from your health care provider about eating or drinking restrictions. What happens during the procedure?  To lower your risk of infection: ? Your health care team will wash or sanitize their hands. ? Your skin will be washed with soap.  An IV tube will be inserted into one of your veins.  You will be given a medicine to help you relax (sedative).  Sticky patches (electrodes) or metal paddles may be placed on your chest.  An electrical shock will be delivered. The procedure may vary among health care providers and hospitals. What happens after the procedure?   Your blood pressure, heart rate, breathing rate, and blood oxygen level will be monitored until the medicines you were given have worn off.  Do not drive for 24 hours if you were given a sedative.  Your heart rhythm will be watched to make sure it does not change. This information is not intended to replace advice given to you by your health care provider. Make sure you discuss any questions you have with your health care provider. Document Released: 07/12/2002 Document Revised: 03/20/2016 Document Reviewed: 01/26/2016 Elsevier Interactive Patient Education  2019 [employer].

## 2018-09-24 LAB — CBC
Hematocrit: 43.2 % (ref 37.5–51.0)
Hemoglobin: 15.4 g/dL (ref 13.0–17.7)
MCH: 33.1 pg — ABNORMAL HIGH (ref 26.6–33.0)
MCHC: 35.6 g/dL (ref 31.5–35.7)
MCV: 93 fL (ref 79–97)
PLATELETS: 239 10*3/uL (ref 150–450)
RBC: 4.65 x10E6/uL (ref 4.14–5.80)
RDW: 12 % (ref 11.6–15.4)
WBC: 6.2 10*3/uL (ref 3.4–10.8)

## 2018-09-24 LAB — BASIC METABOLIC PANEL
BUN/Creatinine Ratio: 23 (ref 10–24)
BUN: 19 mg/dL (ref 8–27)
CO2: 26 mmol/L (ref 20–29)
Calcium: 9.4 mg/dL (ref 8.6–10.2)
Chloride: 102 mmol/L (ref 96–106)
Creatinine, Ser: 0.81 mg/dL (ref 0.76–1.27)
GFR calc Af Amer: 101 mL/min/{1.73_m2} (ref >59)
GFR calc non Af Amer: 88 mL/min/{1.73_m2} (ref >59)
Glucose: 84 mg/dL (ref 65–99)
Potassium: 4.6 mmol/L (ref 3.5–5.2)
Sodium: 146 mmol/L — ABNORMAL HIGH (ref 134–144)

## 2018-09-25 LAB — CUP PACEART INCLINIC DEVICE CHECK
Date Time Interrogation Session: 20200221104709
Implantable Lead Implant Date: 20140421
Implantable Lead Implant Date: 20140421
Implantable Lead Location: 753859
Implantable Lead Location: 753860
Implantable Lead Model: 5076
Implantable Lead Model: 5076
MDC IDC PG IMPLANT DT: 20140421
Pulse Gen Serial Number: 112305

## 2018-10-05 ENCOUNTER — Other Ambulatory Visit: Payer: Self-pay | Admitting: Internal Medicine

## 2018-10-05 ENCOUNTER — Ambulatory Visit (INDEPENDENT_AMBULATORY_CARE_PROVIDER_SITE_OTHER): Payer: Medicare Other | Admitting: *Deleted

## 2018-10-05 DIAGNOSIS — M720 Palmar fascial fibromatosis [Dupuytren]: Secondary | ICD-10-CM | POA: Diagnosis not present

## 2018-10-05 DIAGNOSIS — Z0000 Encounter for general adult medical examination without abnormal findings: Secondary | ICD-10-CM | POA: Diagnosis not present

## 2018-10-05 DIAGNOSIS — F324 Major depressive disorder, single episode, in partial remission: Secondary | ICD-10-CM | POA: Diagnosis not present

## 2018-10-05 DIAGNOSIS — R739 Hyperglycemia, unspecified: Secondary | ICD-10-CM | POA: Diagnosis not present

## 2018-10-05 DIAGNOSIS — I4891 Unspecified atrial fibrillation: Secondary | ICD-10-CM | POA: Diagnosis not present

## 2018-10-05 DIAGNOSIS — Z1211 Encounter for screening for malignant neoplasm of colon: Secondary | ICD-10-CM | POA: Diagnosis not present

## 2018-10-05 DIAGNOSIS — I495 Sick sinus syndrome: Secondary | ICD-10-CM

## 2018-10-05 DIAGNOSIS — R6882 Decreased libido: Secondary | ICD-10-CM | POA: Diagnosis not present

## 2018-10-05 DIAGNOSIS — Z13220 Encounter for screening for lipoid disorders: Secondary | ICD-10-CM | POA: Diagnosis not present

## 2018-10-05 DIAGNOSIS — N529 Male erectile dysfunction, unspecified: Secondary | ICD-10-CM | POA: Diagnosis not present

## 2018-10-05 DIAGNOSIS — I4819 Other persistent atrial fibrillation: Secondary | ICD-10-CM

## 2018-10-05 DIAGNOSIS — J309 Allergic rhinitis, unspecified: Secondary | ICD-10-CM | POA: Diagnosis not present

## 2018-10-05 DIAGNOSIS — Z125 Encounter for screening for malignant neoplasm of prostate: Secondary | ICD-10-CM | POA: Diagnosis not present

## 2018-10-05 LAB — CUP PACEART REMOTE DEVICE CHECK
Battery Remaining Longevity: 54 mo
Battery Remaining Percentage: 55 %
Brady Statistic RA Percent Paced: 0 %
Brady Statistic RV Percent Paced: 32 %
Date Time Interrogation Session: 20200302151514
Implantable Lead Implant Date: 20140421
Implantable Lead Location: 753859
Implantable Lead Location: 753860
Implantable Lead Model: 5076
Implantable Pulse Generator Implant Date: 20140421
Lead Channel Impedance Value: 505 Ohm
Lead Channel Impedance Value: 669 Ohm
Lead Channel Pacing Threshold Amplitude: 1.3 V
Lead Channel Pacing Threshold Pulse Width: 0.4 ms
Lead Channel Pacing Threshold Pulse Width: 0.4 ms
Lead Channel Setting Pacing Amplitude: 1.8 V
Lead Channel Setting Pacing Amplitude: 3.5 V
Lead Channel Setting Pacing Pulse Width: 0.4 ms
Lead Channel Setting Sensing Sensitivity: 2.5 mV
MDC IDC LEAD IMPLANT DT: 20140421
MDC IDC MSMT LEADCHNL RA PACING THRESHOLD AMPLITUDE: 4.5 V
Pulse Gen Serial Number: 112305

## 2018-10-08 NOTE — Anesthesia Preprocedure Evaluation (Addendum)
Anesthesia Evaluation  Patient identified by MRN, date of birth, ID band Patient awake    Reviewed: Allergy & Precautions, NPO status , Patient's Chart, lab work & pertinent test results  Airway Mallampati: II  TM Distance: >3 FB Neck ROM: Full    Dental no notable dental hx. (+) Teeth Intact   Pulmonary former smoker,    Pulmonary exam normal breath sounds clear to auscultation       Cardiovascular hypertension, Pt. on medications + CAD  Normal cardiovascular exam+ dysrhythmias Atrial Fibrillation + pacemaker  Rhythm:Regular Rate:Normal  07/07/2018 TEE Left ventricle: The cavity size was normal. Wall thickness was   normal. Systolic function was normal. The estimated ejection   fraction was in the range of 60% to 65%. - Aortic valve: No evidence of vegetation. - Mitral valve: There was mild to moderate regurgitation. - Left atrium: No evidence of thrombus in the atrial cavity or   appendage.    Neuro/Psych PSYCHIATRIC DISORDERS Depression    GI/Hepatic GERD  Medicated,  Endo/Other    Renal/GU      Musculoskeletal   Abdominal   Peds  Hematology   Anesthesia Other Findings   Reproductive/Obstetrics                            Anesthesia Physical Anesthesia Plan  ASA: III  Anesthesia Plan: General   Post-op Pain Management:    Induction: Intravenous  PONV Risk Score and Plan: Treatment may vary due to age or medical condition  Airway Management Planned: Nasal Cannula and Natural Airway  Additional Equipment:   Intra-op Plan:   Post-operative Plan:   Informed Consent:     Dental advisory given  Plan Discussed with: CRNA  Anesthesia Plan Comments:        Anesthesia Quick Evaluation

## 2018-10-09 ENCOUNTER — Ambulatory Visit (HOSPITAL_COMMUNITY): Payer: Medicare Other | Admitting: Anesthesiology

## 2018-10-09 ENCOUNTER — Ambulatory Visit (HOSPITAL_COMMUNITY)
Admission: RE | Admit: 2018-10-09 | Discharge: 2018-10-09 | Disposition: A | Discharge status: Home / Self Care | Payer: Medicare Other | Attending: Internal Medicine | Admitting: Internal Medicine

## 2018-10-09 ENCOUNTER — Other Ambulatory Visit: Payer: Self-pay

## 2018-10-09 ENCOUNTER — Encounter (HOSPITAL_COMMUNITY): Payer: Self-pay | Admitting: *Deleted

## 2018-10-09 ENCOUNTER — Encounter (HOSPITAL_COMMUNITY)
Admission: RE | Disposition: A | Discharge status: Home / Self Care | Payer: Self-pay | Source: Home / Self Care | Attending: Internal Medicine

## 2018-10-09 DIAGNOSIS — F329 Major depressive disorder, single episode, unspecified: Secondary | ICD-10-CM | POA: Diagnosis not present

## 2018-10-09 DIAGNOSIS — K219 Gastro-esophageal reflux disease without esophagitis: Secondary | ICD-10-CM | POA: Diagnosis not present

## 2018-10-09 DIAGNOSIS — I2510 Atherosclerotic heart disease of native coronary artery without angina pectoris: Secondary | ICD-10-CM | POA: Diagnosis not present

## 2018-10-09 DIAGNOSIS — I10 Essential (primary) hypertension: Secondary | ICD-10-CM | POA: Insufficient documentation

## 2018-10-09 DIAGNOSIS — E785 Hyperlipidemia, unspecified: Secondary | ICD-10-CM | POA: Diagnosis not present

## 2018-10-09 DIAGNOSIS — I4892 Unspecified atrial flutter: Secondary | ICD-10-CM | POA: Insufficient documentation

## 2018-10-09 DIAGNOSIS — I483 Typical atrial flutter: Secondary | ICD-10-CM | POA: Diagnosis not present

## 2018-10-09 DIAGNOSIS — Z7901 Long term (current) use of anticoagulants: Secondary | ICD-10-CM | POA: Diagnosis not present

## 2018-10-09 DIAGNOSIS — I4891 Unspecified atrial fibrillation: Secondary | ICD-10-CM | POA: Diagnosis not present

## 2018-10-09 DIAGNOSIS — Z79899 Other long term (current) drug therapy: Secondary | ICD-10-CM | POA: Insufficient documentation

## 2018-10-09 DIAGNOSIS — I495 Sick sinus syndrome: Secondary | ICD-10-CM | POA: Diagnosis not present

## 2018-10-09 HISTORY — PX: CARDIOVERSION: SHX1299

## 2018-10-09 LAB — POCT I-STAT 4, (NA,K, GLUC, HGB,HCT)
Glucose, Bld: 91 mg/dL (ref 70–99)
HCT: 41 % (ref 39.0–52.0)
Hemoglobin: 13.9 g/dL (ref 13.0–17.0)
Potassium: 3.4 mmol/L — ABNORMAL LOW (ref 3.5–5.1)
Sodium: 141 mmol/L (ref 135–145)

## 2018-10-09 SURGERY — CARDIOVERSION
Anesthesia: General

## 2018-10-09 MED ORDER — LIDOCAINE HCL (CARDIAC) PF 100 MG/5ML IV SOSY
PREFILLED_SYRINGE | INTRAVENOUS | Status: DC | PRN
  Administered 2018-10-09: 80 mg via INTRAVENOUS

## 2018-10-09 MED ORDER — PROPOFOL 10 MG/ML IV BOLUS
INTRAVENOUS | Status: DC | PRN
  Administered 2018-10-09: 80 mg via INTRAVENOUS

## 2018-10-09 MED ORDER — SODIUM CHLORIDE 0.9 % IV SOLN
INTRAVENOUS | Status: AC | PRN
  Administered 2018-10-09: 500 mL via INTRAVENOUS
  Administered 2018-10-09: 09:00:00 via INTRAVENOUS

## 2018-10-09 NOTE — Anesthesia Postprocedure Evaluation (Signed)
Anesthesia Post Note  Patient: [name]  Procedure(s) Performed: CARDIOVERSION (N/A )     Patient location during evaluation: Endoscopy Anesthesia Type: General Level of consciousness: awake and alert Pain management: pain level controlled Vital Signs Assessment: post-procedure vital signs reviewed and stable Respiratory status: spontaneous breathing, nonlabored ventilation, respiratory function stable and patient connected to nasal cannula oxygen Cardiovascular status: blood pressure returned to baseline and stable Postop Assessment: no apparent nausea or vomiting Anesthetic complications: no    Last Vitals:  Vitals:   10/09/18 0759 10/09/18 0938  BP: 131/70 109/69  Pulse:  70  Resp: 16 13  Temp: 36.6 C 36.6 C  SpO2: 98% 99%    Last Pain:  Vitals:   10/09/18 0938  TempSrc: Oral  PainSc: 0-No pain                 Barnet Glasgow

## 2018-10-09 NOTE — Discharge Instructions (Signed)
Electrical Cardioversion, Care After °This sheet gives you information about how to care for yourself after your procedure. Your health care provider may also give you more specific instructions. If you have problems or questions, contact your health care provider. °What can I expect after the procedure? °After the procedure, it is common to have: °· Some redness on the skin where the shocks were given. °Follow these instructions at home: ° °· Do not drive for 24 hours if you were given a medicine to help you relax (sedative). °· Take over-the-counter and prescription medicines only as told by your health care provider. °· Ask your health care provider how to check your pulse. Check it often. °· Rest for 48 hours after the procedure or as told by your health care provider. °· Avoid or limit your caffeine use as told by your health care provider. °Contact a health care provider if: °· You feel like your heart is beating too quickly or your pulse is not regular. °· You have a serious muscle cramp that does not go away. °Get help right away if: ° °· You have discomfort in your chest. °· You are dizzy or you feel faint. °· You have trouble breathing or you are short of breath. °· Your speech is slurred. °· You have trouble moving an arm or leg on one side of your body. °· Your fingers or toes turn cold or blue. °This information is not intended to replace advice given to you by your health care provider. Make sure you discuss any questions you have with your health care provider. °Document Released: 05/12/2013 Document Revised: 02/23/2016 Document Reviewed: 01/26/2016 °Elsevier Interactive Patient Education © 2019 Elsevier Inc. ° °

## 2018-10-09 NOTE — H&P (Signed)
   INTERVAL PROCEDURE H&P  History and Physical Interval Note:  10/09/2018 9:13 AM  [name] has presented today for their planned procedure. The various methods of treatment have been discussed with the patient and family. After consideration of risks, benefits and other options for treatment, the patient has consented to the procedure.  The patients' outpatient history has been reviewed, patient examined, and no change in status from most recent office note within the past 30 days. I have reviewed the patients' chart and labs and will proceed as planned. Questions were answered to the patient's satisfaction.   Paul Casino, MD, [facility] [place] Director of the Advanced Lipid Disorders &  Cardiovascular Risk Reduction Clinic Diplomate of the American Board of Clinical Lipidology Attending Cardiologist  Direct Dial: [phone]  Fax: [phone]  Website:  [name] 10/09/2018, 9:13 AM

## 2018-10-09 NOTE — Transfer of Care (Signed)
Immediate Anesthesia Transfer of Care Note  Patient: [name]  Procedure(s) Performed: CARDIOVERSION (N/A )  Patient Location: Endoscopy Unit  Anesthesia Type:General  Level of Consciousness: sedated  Airway & Oxygen Therapy: Patient Spontanous Breathing and Patient connected to nasal cannula oxygen  Post-op Assessment: Report given to RN, Post -op Vital signs reviewed and stable and Patient moving all extremities  Post vital signs: Reviewed and stable  Last Vitals:  Vitals Value Taken Time  BP    Temp    Pulse 60 10/09/2018  9:37 AM  Resp 11 10/09/2018  9:37 AM  SpO2 99 % 10/09/2018  9:37 AM  Vitals shown include unvalidated device data.  Last Pain:  Vitals:   10/09/18 0759  TempSrc: Oral  PainSc: 0-No pain         Complications: No apparent anesthesia complications

## 2018-10-09 NOTE — CV Procedure (Signed)
   CARDIOVERSION NOTE  Procedure: Electrical Cardioversion Indications:  Atrial Flutter  Procedure Details:  Consent: Risks of procedure as well as the alternatives and risks of each were explained to the (patient/caregiver).  Consent for procedure obtained.  Time Out: Verified patient identification, verified procedure, site/side was marked, verified correct patient position, special equipment/implants available, medications/allergies/relevent history reviewed, required imaging and test results available.  Performed  Patient placed on cardiac monitor, pulse oximetry, supplemental oxygen as necessary.  Sedation given: propofol per anesthesia Pacer pads placed anterior and posterior chest.  Cardioverted 1 time(s).  Cardioverted at 150J biphasic.  Impression: Findings: Post procedure EKG shows: AV Paced rhythm Complications: None - device interrogated post-procedure, working normally Patient did tolerate procedure well.  Plan: 1. Successful DCCV to AV Paced rhythm with a single 150J Biphasic shock.  Time Spent Directly with the Patient:  30 minutes   Pixie Casino, MD, [facility] [place] Director of the Advanced Lipid Disorders &  Cardiovascular Risk Reduction Clinic Diplomate of the American Board of Clinical Lipidology Attending Cardiologist  Direct Dial: [phone]  Fax: [phone]  Website:  [email or web address] 10/09/2018, 9:40 AM

## 2018-10-10 ENCOUNTER — Encounter (HOSPITAL_COMMUNITY): Payer: Self-pay | Admitting: Internal Medicine

## 2018-10-13 NOTE — Progress Notes (Signed)
Remote pacemaker transmission.   

## 2018-10-15 ENCOUNTER — Other Ambulatory Visit: Payer: Self-pay

## 2018-10-15 DIAGNOSIS — R6882 Decreased libido: Secondary | ICD-10-CM | POA: Diagnosis not present

## 2018-10-15 MED ORDER — RIVAROXABAN 20 MG PO TABS: 20.0000 mg | ORAL_TABLET | Freq: Every day | ORAL | 1 refills | Status: DC

## 2018-10-15 NOTE — Telephone Encounter (Signed)
Scr 0.81 on 09/23/2018 Last OV 09/23/2018 crcl 72ml/min rx for xarelto 20mg  sent to pharmacy

## 2018-10-16 ENCOUNTER — Other Ambulatory Visit: Payer: Self-pay

## 2018-10-16 ENCOUNTER — Encounter: Payer: Medicare Other | Admitting: Internal Medicine

## 2018-10-16 MED ORDER — RIVAROXABAN 20 MG PO TABS: 20.0000 mg | ORAL_TABLET | Freq: Every day | ORAL | 1 refills | Status: DC

## 2018-10-16 NOTE — Telephone Encounter (Signed)
Age 73, weight 94kg, SCr 0.81 on 09/23/18 in Epic, last OV Feb 2020. CrCl > 169mL/min.

## 2018-10-19 ENCOUNTER — Other Ambulatory Visit: Payer: Self-pay

## 2018-10-20 ENCOUNTER — Telehealth: Payer: Self-pay | Admitting: *Deleted

## 2018-10-20 NOTE — Telephone Encounter (Signed)
Received triage alert for AT/AF burden, s/p DCCV on 10/09/18. Per alert transmission from 10/19/18, patient has been back in AT/A-flutter as of 10/15/18 at 22:20. V rate controlled  Patient reports compliance with dofetilide and rivaroxaban. He noticed some HR variability the first few days after the cardioversion, but feels fine now. Reports he is not as winded when walking as he was prior to cardioversion.  Advised I will route this message to Dr. Rayann Heman for review/recommendations. Pt is open to a telehealth appt if necessary, next scheduled f/u is on 11/25/18 with Dr. Rayann Heman. Pt verbalizes understanding and agreement with plan.

## 2018-10-25 NOTE — Telephone Encounter (Signed)
Schedule telehealth visit with AF clinic Not urgent.

## 2018-10-28 ENCOUNTER — Encounter (HOSPITAL_COMMUNITY): Payer: Self-pay

## 2018-10-29 ENCOUNTER — Ambulatory Visit (HOSPITAL_COMMUNITY)
Admission: RE | Admit: 2018-10-29 | Discharge: 2018-10-29 | Disposition: A | Discharge status: Home / Self Care | Payer: Medicare Other | Source: Ambulatory Visit | Attending: Nurse Practitioner | Admitting: Nurse Practitioner

## 2018-10-29 ENCOUNTER — Encounter (HOSPITAL_COMMUNITY): Payer: Self-pay

## 2018-10-29 ENCOUNTER — Other Ambulatory Visit: Payer: Self-pay

## 2018-10-29 ENCOUNTER — Telehealth: Payer: Self-pay | Admitting: Internal Medicine

## 2018-10-29 DIAGNOSIS — I4892 Unspecified atrial flutter: Secondary | ICD-10-CM

## 2018-10-29 NOTE — Telephone Encounter (Signed)
Virtual visit scheduled with [name] at 2pm today.

## 2018-10-29 NOTE — Progress Notes (Addendum)
Electrophysiology TeleHealth Note   Due to national recommendations of social distancing due to [place] 19, video telehealth visit is felt to be most appropriate for this patient at this time.  See MyChart message/consent below from today for patient consent regarding telehealth for the [facility].    Date:  10/29/2018   ID:  [name], DOB 1945-09-16, MRN [id number]  Location: home  Provider location: [address] [place], [place] [address] Evaluation Performed:/Follow up   PCP:  [name], MD  Primary Cardiologist:  none Primary Electrophysiologist: Dr. Rayann Heman  CC: A flutter   History of Present Illness: [name] is a 73 y.o. male who presents via video conferencing for a telehealth visit today.   The patient is referred for  return to atach/flutte ras seen on PPM report after recent cardioversion  by Dr. Rayann Heman.  He had ablation 07/08/18 and despite being on Tikosyn, he did not enjoy SR after the procedure. He had a cardioversion at one month after the procedure but had ERAF. At the 3 month mark /u ablation, he was still out of rhythm and was set up for a repeat cardioversion , again  with ERAF. Per Dr. Jackalyn Lombard last note if he continued to be out of rhythm, he would recommend living in rate controlled a fib, amiodarone with stop of tikosyn, convergent or Maze procedure.   I discussed these options in detail with the patient today. He feels very well at rest and reports that his HR is in the 70"s for the most part. His HR felt as though it was racing when he first went out of rhythm after the cardioversion.His BP mostly around 353 systolic.He does have some  exertional dyspnea, unchanged from his usual when in afib. [name] His weight is stable. Occassionally will feel  palpitations.  Today, he denies symptoms of  PND, lower extremity edema, claudication, dizziness, presyncope, syncope, bleeding, or neurologic sequela. The patient is tolerating medications  without difficulties and is otherwise without complaint today.   he denies symptoms of cough, fevers, chills, or new SOB worrisome for COVID 19.     Atrial Fibrillation Risk Factors:  he does have symptoms or diagnosis of sleep apnea.  CPAP therapy. he does not have a history of rheumatic fever. he does have a history of alcohol use. The patient does not have a history of early familial atrial fibrillation or other arrhythmias.  he has a BMI of There is no height or weight on file to calculate BMI.. There were no vitals filed for this visit.  Past Medical History:  Diagnosis Date   1st degree AV block    Atrial flutter ([place])    s/p ablation 2006   CAD (coronary artery disease)    Mild 2 vessel disease per cath in 2006; myoview neg 2011   Chronic anticoagulation    on Xarelto - started 11/12/11   Depression 11-05-11   On Cymbalta   GERD (gastroesophageal reflux disease)    Hyperlipidemia    Hypertension    Normal nuclear stress test 2011   Persistent atrial fibrillation    recent new occurrence 3/13>>Tikosyn   PVC's (premature ventricular contractions)    Right ventricular enlargement    Mild noted echo 3/13   Sick sinus syndrome ([place]) 11/23/2012   Dr Caryl Comes   Tobacco abuse    Past Surgical History:  Procedure Laterality Date   ABLATION OF DYSRHYTHMIC FOCUS  07/07/2018   ATRIAL FIBRILLATION ABLATION N/A  07/07/2018   Procedure: ATRIAL FIBRILLATION ABLATION;  Surgeon: [name], MD;  Location: [place] [facility];  Service: Cardiovascular;  Laterality: N/A;   CARDIAC CATHETERIZATION  11/1988, 12/07/2004   showing essentially normal left  ventricular function, moderate two-vessel disease (50% LAD after the third diagonal and a 60+% stenosis in the second obtuse marginal)          CARDIOVERSION N/A 12/05/2011   Procedure: CARDIOVERSION;  Surgeon: Deboraha Sprang, MD;  Location: [facility] CATH LAB;  Service: Cardiovascular;  Laterality: N/A;   CARDIOVERSION N/A 08/07/2018     Procedure: CARDIOVERSION;  Surgeon: Lelon Perla, MD;  Location: [facility] ENDOSCOPY;  Service: Cardiovascular;  Laterality: N/A;   CARDIOVERSION N/A 10/09/2018   Procedure: CARDIOVERSION;  Surgeon: Pixie Casino, MD;  Location: [place];  Service: Cardiovascular;  Laterality: N/A;   Ewa Gentry   left-retained hardware   [place]  01/28/2008   Recurrent right inguinal hernia Laparoscopic, preperitoneal repair of recurrent right inguinal hernia with mesh -- SURGEON:  Dr. Fanny Skates.   INGUINAL HERNIA REPAIR  11/06/2011   Procedure: HERNIA REPAIR INGUINAL ADULT;  Surgeon: Adin Hector, MD;  [facility]: [facility];  Service: General;  Laterality: Left;   PACEMAKER INSERTION  11/23/2012   Dr Caryl Comes   PERMANENT PACEMAKER INSERTION N/A 11/23/2012   Procedure: PERMANENT PACEMAKER INSERTION;  Surgeon: Deboraha Sprang, MD;  Location[facility] CATH LAB;  Service: Cardiovascular;  Laterality: N/A;   TEE WITH CARDIOVERSION  2001, 2006   Invasive electrophysiology study, electroanatomical mapping,and radiofrequency catheter ablation. -- patient's arrhythmia was isthmus dependent flutter not withstanding the very long cycle length.  RF energy delivered across the cavotricuspid isthmus successfully interrupted conduction across the isthmus and eliminated the patient's sub-straight. PHYSICIAN:  Deboraha Sprang, M.D   TEE WITHOUT CARDIOVERSION  12/31/2011   Procedure: TRANSESOPHAGEAL ECHOCARDIOGRAM (TEE);  Surgeon: Josue Hector, MD;  Location: [place];  Service: Cardiovascular;  Laterality: N/A;  tba after for tikosyn   TEE WITHOUT CARDIOVERSION N/A 07/07/2018   Procedure: TRANSESOPHAGEAL ECHOCARDIOGRAM (TEE);  Surgeon: Acie Fredrickson Wonda Cheng, MD;  Location: [facility] ENDOSCOPY;  Service: Cardiovascular;  Laterality: N/A;   VASECTOMY       Current Outpatient Medications  Medication Sig Dispense Refill   cetirizine (ZYRTEC) 10 MG tablet Take 10 mg by mouth daily.      diphenhydrAMINE (BENADRYL) 25 MG tablet Take 50 mg by mouth at bedtime.     dofetilide (TIKOSYN) 500 MCG capsule TAKE 1 CAPSULE TWICE A DAY ( KEEP UPCOMING APPOINTMENT IN SEPTEMBER FOR FUTURE REFILLS ) (Patient taking differently: Take 500 mcg by mouth 2 (two) times daily. ) 180 capsule 3   fluticasone (FLONASE) 50 MCG/ACT nasal spray Place 1-2 sprays into both nostrils at bedtime.      furosemide (LASIX) 40 MG tablet Take 3 tablets (120 mg total) by mouth daily. 270 tablet 3   metolazone (ZAROXOLYN) 2.5 MG tablet Take 1 tablet (2.5 mg total) by mouth daily. (Patient taking differently: Take 2.5 mg by mouth daily as needed (swelling). ) 5 tablet 10   OVER THE COUNTER MEDICATION Take 1 Dose by mouth daily as needed (pain, mood).      OVER THE COUNTER MEDICATION Take 1 tablet by mouth daily.     OVER THE COUNTER MEDICATION Take 1 Scoop by mouth daily.     pantoprazole (PROTONIX) 40 MG tablet Take 1 tablet (40 mg total) by mouth daily. (Patient  not taking: Reported on 10/06/2018) 45 tablet 0   rivaroxaban (XARELTO) 20 MG TABS tablet Take 1 tablet (20 mg total) by mouth daily with supper. 90 tablet 1   VIAGRA 100 MG tablet Take 100 mg by mouth as needed for erectile dysfunction.      No current facility-administered medications for this encounter.     Allergies:   Horse-derived products   Social History:  The patient  reports that he quit smoking about 52 years ago. His smoking use included cigarettes. He quit after 4.00 years of use. He has never used smokeless tobacco. He reports current alcohol use of about 2.0 standard drinks of alcohol per week. He reports that he does not use drugs.   Family History:  The patient's  family history includes Atrial fibrillation in his mother; Cancer in his father, paternal uncle, and sister; Coronary artery disease in his father; Heart attack (age of onset: 21) in his father; Heart failure (age of onset: 34) in his mother; Kidney disease in his father.     ROS:  Please see the history of present illness.   All other systems are personally reviewed and negative.   Exam: Well appearing, alert and conversant, regular work of breathing,  good skin color  Recent Labs: 07/08/2018: Magnesium 2.0 09/23/2018: BUN 19; Creatinine, Ser 0.81; Platelets 239 10/09/2018: Hemoglobin 13.9; Potassium 3.4; Sodium 141  personally reviewed    Other studies personally reviewed: Epic records reviewed,  Including report from device clinic     ASSESSMENT AND PLAN:  1.  Persisitent atrial fibrillation/atach/a flutter Persistent despite ablation, tikosyn and cardioversion x 2 since ablation Discussed options with the patient of living in rate controlled afib/ amiodarone  (with d/c of tikosyn) or maze or convergent procedure. He did not recommend repeat ablation. He really is not that excited about the options and feels at this point he may live in afib, but he will think about it  a little longer. He will let me know if he wants to make a change before he sees Dr.  Rayann Heman back on 4/19.  This patients CHA2DS2-VASc Score and unadjusted Ischemic Stroke Rate (% per year) is equal to 3.2 % stroke rate/year from a score of 3  Above score calculated as 1 point each if present [CHF, HTN, DM, Vascular=MI/PAD/Aortic Plaque, Age if 65-74, or Male] Above score calculated as 2 points each if present [Age > 75, or Stroke/TIA/TE]  Continue xarelto 20 mg daily    2.COVID screen The patient does not have any symptoms that suggest any further testing/ screening at this time.    Follow-up:  With Dr. Rayann Heman 4/19, sooner in afib clinic if needed.  Current medicines are reviewed at length with the patient today.   The patient does not have concerns regarding his medicines.  The following changes were made today:  none  Labs/ tests ordered today include:  No orders of the defined types were placed in this encounter.   Patient Risk:  after full review of this patients  clinical status, I feel that they are at moderate risk at this time.   Today, I have spent 38 minutes with the patient with telehealth technology discussing options to restore SR  Signed, Roderic Palau NP  10/29/2018 2:54 PM  [facility] [address] [place], [place] [address] [phone]   I hereby voluntarily request, consent and authorize the [facility] and its employed or contracted physicians, physician assistants, nurse practitioners or other  licensed health care professionals (the Practitioner), to provide me with telemedicine health care services (the "Services") as deemed necessary by the treating Practitioner. I acknowledge and consent to receive the Services by the Practitioner via telemedicine. I understand that the telemedicine visit will involve communicating with the Practitioner through live audiovisual communication technology and the disclosure of certain medical information by electronic transmission. I acknowledge that I have been given the opportunity to request an in-person assessment or other available alternative prior to the telemedicine visit and am voluntarily participating in the telemedicine visit.   I understand that I have the right to withhold or withdraw my consent to the use of telemedicine in the course of my care at any time, without affecting my right to future care or treatment, and that the Practitioner or I may terminate the telemedicine visit at any time. I understand that I have the right to inspect all information obtained and/or recorded in the course of the telemedicine visit and may receive copies of available information for a reasonable fee.  I understand that some of the potential risks of receiving the Services via telemedicine include:   Delay or interruption in medical evaluation due to technological equipment failure or disruption;  Information transmitted may not be sufficient (e.g. poor resolution of  images) to allow for appropriate medical decision making by the Practitioner; and/or  In rare instances, security protocols could fail, causing a breach of personal health information.   Furthermore, I acknowledge that it is my responsibility to provide information about my medical history, conditions and care that is complete and accurate to the best of my ability. I acknowledge that Practitioner's advice, recommendations, and/or decision may be based on factors not within their control, such as incomplete or inaccurate data provided by me or distortions of diagnostic images or specimens that may result from electronic transmissions. I understand that the practice of medicine is not an exact science and that Practitioner makes no warranties or guarantees regarding treatment outcomes. I acknowledge that I will receive a copy of this consent concurrently upon execution via email to the email address I last provided but may also request a printed copy by calling the office of the [facility].  I understand that my insurance will be billed for this visit.   I have read or had this consent read to me.  I understand the contents of this consent, which adequately explains the benefits and risks of the Services being provided via telemedicine.  I have been provided ample opportunity to ask questions regarding this consent and the Services and have had my questions answered to my satisfaction.  I give my informed consent for the services to be provided through the use of telemedicine in my medical care  By participating in this telemedicine visit I agree to the above.

## 2018-10-29 NOTE — Telephone Encounter (Signed)
Offered virtual visit to patient.

## 2018-10-29 NOTE — Telephone Encounter (Signed)
New message   Patient Is returning call about Virtual Visit.

## 2018-11-24 ENCOUNTER — Telehealth: Payer: Self-pay

## 2018-11-24 NOTE — Telephone Encounter (Signed)
Spoke with pt regarding appt on 4/22/0. Pt stated he will check vitals prior to appt. Pt questions and concerns were address.

## 2018-11-25 ENCOUNTER — Telehealth (INDEPENDENT_AMBULATORY_CARE_PROVIDER_SITE_OTHER): Payer: Medicare Other | Admitting: Internal Medicine

## 2018-11-25 VITALS — BP 120/75 | HR 70

## 2018-11-25 DIAGNOSIS — I495 Sick sinus syndrome: Secondary | ICD-10-CM

## 2018-11-25 DIAGNOSIS — G4733 Obstructive sleep apnea (adult) (pediatric): Secondary | ICD-10-CM

## 2018-11-25 DIAGNOSIS — I4819 Other persistent atrial fibrillation: Secondary | ICD-10-CM | POA: Diagnosis not present

## 2018-11-25 NOTE — Progress Notes (Signed)
Electrophysiology TeleHealth Note   Due to national recommendations of social distancing due to COVID 19, an audio/video telehealth visit is felt to be most appropriate for this patient at this time.  See MyChart message from today for the patient's consent to telehealth for [facility].   Date:  11/25/2018   ID:  [name], DOB 05-14-1946, MRN [id number]  Location: patient's home  Provider location: [address] [place]  Evaluation Performed: Follow-up visit  PCP:  [name], MD   Electrophysiologist:  Dr Caryl Comes  Chief Complaint:  afib  History of Present Illness:    [name] is a 73 y.o. male who presents via audio/video conferencing for a telehealth visit today.  Since last being seen in our clinic, the patient reports doing reasonably well. He did have atypical atrial flutter in February for which he underwent cardioversion in March.  Unfortunately, he quickly returned to afib.  He feels well at rest.  With activity, he gets SOB.  Today, he denies symptoms of palpitations, chest pain,  lower extremity edema, dizziness, presyncope, or syncope.  The patient is otherwise without complaint today.  The patient denies symptoms of fevers, chills, cough, or new SOB worrisome for COVID 19.  Past Medical History:  Diagnosis Date  . 1st degree AV block   . Atrial flutter ([place])    s/p ablation 2006  . CAD (coronary artery disease)    Mild 2 vessel disease per cath in 2006; myoview neg 2011  . Chronic anticoagulation    on Xarelto - started 11/12/11  . Depression 11-05-11   On Cymbalta  . GERD (gastroesophageal reflux disease)   . Hyperlipidemia   . Hypertension   . Normal nuclear stress test 2011  . Persistent atrial fibrillation    recent new occurrence 3/13>>Tikosyn  . PVC's (premature ventricular contractions)   . Right ventricular enlargement    Mild noted echo 3/13  . Sick sinus syndrome ([place]) 11/23/2012   Dr Caryl Comes  . Tobacco abuse     Past  Surgical History:  Procedure Laterality Date  . ABLATION OF DYSRHYTHMIC FOCUS  07/07/2018  . ATRIAL FIBRILLATION ABLATION N/A 07/07/2018   Procedure: ATRIAL FIBRILLATION ABLATION;  Surgeon: [name], MD;  Location: [place] CV LAB;  Service: Cardiovascular;  Laterality: N/A;  . CARDIAC CATHETERIZATION  11/1988, 12/07/2004   showing essentially normal left  ventricular function, moderate two-vessel disease (50% LAD after the third diagonal and a 60+% stenosis in the second obtuse marginal)         . CARDIOVERSION N/A 12/05/2011   Procedure: CARDIOVERSION;  Surgeon: Deboraha Sprang, MD;  Location: [facility] CATH LAB;  Service: Cardiovascular;  Laterality: N/A;  . CARDIOVERSION N/A 08/07/2018   Procedure: CARDIOVERSION;  Surgeon: Lelon Perla, MD;  Location: [facility] ENDOSCOPY;  Service: Cardiovascular;  Laterality: N/A;  . CARDIOVERSION N/A 10/09/2018   Procedure: CARDIOVERSION;  Surgeon: Pixie Casino, MD;  Location: [facility] ENDOSCOPY;  Service: Cardiovascular;  Laterality: N/A;  . [place]   left-retained hardware  . HERNIA REPAIR    . INGUINAL HERNIA REPAIR  01/28/2008   Recurrent right inguinal hernia Laparoscopic, preperitoneal repair of recurrent right inguinal hernia with mesh -- SURGEON:  Dr. Fanny Skates.  . INGUINAL HERNIA REPAIR  11/06/2011   Procedure: HERNIA REPAIR INGUINAL ADULT;  Surgeon: Adin Hector, MD;  Location: [facility];  Service: General;  Laterality: Left;  . PACEMAKER INSERTION  11/23/2012   Dr  Caryl Comes  . PERMANENT PACEMAKER INSERTION N/A 11/23/2012   Procedure: PERMANENT PACEMAKER INSERTION;  Surgeon: Deboraha Sprang, MD;  Location: [facility] CATH LAB;  Service: Cardiovascular;  Laterality: N/A;  . TEE WITH CARDIOVERSION  2001, 2006   Invasive electrophysiology study, electroanatomical mapping,and radiofrequency catheter ablation. -- patient's arrhythmia was isthmus dependent flutter not withstanding the very long cycle length.  RF energy delivered across the cavotricuspid  isthmus successfully interrupted conduction across the isthmus and eliminated the patient's sub-straight. PHYSICIAN:  Deboraha Sprang, M.D  . TEE WITHOUT CARDIOVERSION  12/31/2011   Procedure: TRANSESOPHAGEAL ECHOCARDIOGRAM (TEE);  Surgeon: Josue Hector, MD;  Location: [facility] ENDOSCOPY;  Service: Cardiovascular;  Laterality: N/A;  tba after for tikosyn  . TEE WITHOUT CARDIOVERSION N/A 07/07/2018   Procedure: TRANSESOPHAGEAL ECHOCARDIOGRAM (TEE);  Surgeon: Acie Fredrickson Wonda Cheng, MD;  Location: [facility] ENDOSCOPY;  Service: Cardiovascular;  Laterality: N/A;  . VASECTOMY      Current Outpatient Medications  Medication Sig Dispense Refill  . cetirizine (ZYRTEC) 10 MG tablet Take 10 mg by mouth daily.    . diphenhydrAMINE (BENADRYL) 25 MG tablet Take 50 mg by mouth at bedtime.    . fluticasone (FLONASE) 50 MCG/ACT nasal spray Place 1-2 sprays into both nostrils at bedtime.     . furosemide (LASIX) 40 MG tablet Take 3 tablets (120 mg total) by mouth daily. 270 tablet 3  . metolazone (ZAROXOLYN) 2.5 MG tablet Take 2.5 mg by mouth daily.    [name] OVER THE COUNTER MEDICATION Take 1 Dose by mouth daily as needed (pain, mood).     [name] OVER THE COUNTER MEDICATION Take 1 tablet by mouth daily.    [name] OVER THE COUNTER MEDICATION Take 1 Scoop by mouth daily.    . rivaroxaban (XARELTO) 20 MG TABS tablet Take 1 tablet (20 mg total) by mouth daily with supper. 90 tablet 1  . VIAGRA 100 MG tablet Take 100 mg by mouth as needed for erectile dysfunction.      No current facility-administered medications for this visit.     Allergies:   Horse-derived products   Social History:  The patient  reports that he quit smoking about 52 years ago. His smoking use included cigarettes. He quit after 4.00 years of use. He has never used smokeless tobacco. He reports current alcohol use of about 2.0 standard drinks of alcohol per week. He reports that he does not use drugs.   Family History:  The patient's  family history includes Atrial  fibrillation in his mother; Cancer in his father, paternal uncle, and sister; Coronary artery disease in his father; Heart attack (age of onset: 68) in his father; Heart failure (age of onset: 69) in his mother; Kidney disease in his father.   ROS:  Please see the history of present illness.   All other systems are personally reviewed and negative.    Exam:    Vital Signs:  BP 120/75   Pulse 70   Well appearing, alert and conversant, regular work of breathing,  good skin color Eyes- anicteric, neuro- grossly intact, skin- no apparent rash or lesions or cyanosis, mouth- oral mucosa is pink   Labs/Other Tests and Data Reviewed:    Recent Labs: 07/08/2018: Magnesium 2.0 09/23/2018: BUN 19; Creatinine, Ser 0.81; Platelets 239 10/09/2018: Hemoglobin 13.9; Potassium 3.4; Sodium 141   Wt Readings from Last 3 Encounters:  10/09/18 208 lb (94.3 kg)  09/23/18 208 lb (94.3 kg)  08/14/18 206 lb 6.4 oz (93.6 kg)  Other studies personally reviewed: Additional studies/ records that were reviewed today include: my prior office notes, AF clinic  notes  Review of the above records today demonstrates: as above  Last device remote is reviewed from [place] PDF dated 10/05/2018 which reveals normal device function, persistent afib   ASSESSMENT & PLAN:    1.  Persistent atrial fibrillation S/p afib ablation 07/2019.  Despite ablation and tikosyn, he remains in afib. He did have atypical atrial flutter in February for which he underwent cardioversion in March.   Unfortunately, he quickly returned to afib.  He has severe RA enlargement and at least moderate LA enlargement and is unlikely to benefit from additional endovascular ablation. chads2vasc score is 3.  He is on xarelto I will stop tikosyn today due to its ineffectivelyness. Additional AF management options include long term rate control, amiodarone, or Convergent procedure/ MAZE were discussed at length today. I have advised that he consider  amiodarone.  He is very reluctant to consider this and is leaning towards long term rate control. I have encouraged him to follow-up with Dr Caryl Comes in 2 months for further discussions about amiodarone as an option.  2. Sick sinus syndrome Normal device function by remotes  3. HTN Stable No change required today  4. OSA Compliance with CPAP is encouraged  5. COVID 19 screen The patient denies symptoms of COVID 19 at this time.  The importance of social distancing was discussed today.  Follow-up:  Dr Caryl Comes with telehealth visit in 2 months Next remote: 01/2019  Current medicines are reviewed at length with the patient today.   The patient does not have concerns regarding his medicines.  The following changes were made today:  none  Labs/ tests ordered today include:  No orders of the defined types were placed in this encounter.   Patient Risk:  after full review of this patients clinical status, I feel that they are at moderate risk at this time.  Today, I have spent 20 minutes with the patient with telehealth technology discussing afib .    SignedThompson Grayer, MD  11/25/2018 2:40 PM     [address] [place] [address] [phone] (office) [phone] (fax)

## 2019-01-04 ENCOUNTER — Ambulatory Visit (INDEPENDENT_AMBULATORY_CARE_PROVIDER_SITE_OTHER): Payer: Medicare Other | Admitting: *Deleted

## 2019-01-04 DIAGNOSIS — I495 Sick sinus syndrome: Secondary | ICD-10-CM

## 2019-01-05 LAB — CUP PACEART REMOTE DEVICE CHECK
Battery Remaining Longevity: 54 mo
Battery Remaining Percentage: 60 %
Brady Statistic RA Percent Paced: 1 %
Brady Statistic RV Percent Paced: 43 %
Date Time Interrogation Session: 20200601045100
Implantable Lead Implant Date: 20140421
Implantable Lead Implant Date: 20140421
Implantable Lead Location: 753859
Implantable Lead Location: 753860
Implantable Lead Model: 5076
Implantable Lead Model: 5076
Implantable Pulse Generator Implant Date: 20140421
Lead Channel Impedance Value: 529 Ohm
Lead Channel Impedance Value: 622 Ohm
Lead Channel Pacing Threshold Amplitude: 0.4 V
Lead Channel Pacing Threshold Amplitude: 1.3 V
Lead Channel Pacing Threshold Pulse Width: 0.4 ms
Lead Channel Pacing Threshold Pulse Width: 0.4 ms
Lead Channel Setting Pacing Amplitude: 1.6 V
Lead Channel Setting Pacing Amplitude: 2.5 V
Lead Channel Setting Pacing Pulse Width: 0.4 ms
Lead Channel Setting Sensing Sensitivity: 2.5 mV
Pulse Gen Serial Number: 112305

## 2019-01-06 DIAGNOSIS — H40013 Open angle with borderline findings, low risk, bilateral: Secondary | ICD-10-CM | POA: Diagnosis not present

## 2019-01-06 DIAGNOSIS — H02831 Dermatochalasis of right upper eyelid: Secondary | ICD-10-CM | POA: Diagnosis not present

## 2019-01-06 DIAGNOSIS — Z961 Presence of intraocular lens: Secondary | ICD-10-CM | POA: Diagnosis not present

## 2019-01-06 DIAGNOSIS — H02834 Dermatochalasis of left upper eyelid: Secondary | ICD-10-CM | POA: Diagnosis not present

## 2019-01-12 ENCOUNTER — Encounter: Payer: Self-pay | Admitting: Cardiology

## 2019-01-12 NOTE — Progress Notes (Signed)
Remote pacemaker transmission.   

## 2019-01-25 ENCOUNTER — Telehealth: Payer: Self-pay | Admitting: Internal Medicine

## 2019-01-25 ENCOUNTER — Encounter: Payer: Self-pay | Admitting: Internal Medicine

## 2019-01-25 ENCOUNTER — Telehealth: Payer: Self-pay | Admitting: *Deleted

## 2019-01-25 ENCOUNTER — Telehealth (INDEPENDENT_AMBULATORY_CARE_PROVIDER_SITE_OTHER): Payer: Medicare Other | Admitting: Internal Medicine

## 2019-01-25 ENCOUNTER — Other Ambulatory Visit: Payer: Self-pay

## 2019-01-25 VITALS — BP 118/73 | HR 65 | Ht 72.0 in | Wt 202.0 lb

## 2019-01-25 DIAGNOSIS — I4819 Other persistent atrial fibrillation: Secondary | ICD-10-CM | POA: Diagnosis not present

## 2019-01-25 DIAGNOSIS — Z950 Presence of cardiac pacemaker: Secondary | ICD-10-CM

## 2019-01-25 DIAGNOSIS — R0609 Other forms of dyspnea: Secondary | ICD-10-CM

## 2019-01-25 DIAGNOSIS — F32A Depression, unspecified: Secondary | ICD-10-CM

## 2019-01-25 DIAGNOSIS — F329 Major depressive disorder, single episode, unspecified: Secondary | ICD-10-CM

## 2019-01-25 NOTE — Progress Notes (Signed)
Electrophysiology TeleHealth Note   Due to national recommendations of social distancing due to COVID 19, an audio/video telehealth visit is felt to be most appropriate for this patient at this time.  See MyChart message from today for the patient's consent to telehealth for [facility].   Date:  01/25/2019   ID:  [name], DOB Jul 23, 1946, MRN [id number]  Location: patient's home  Provider location: [address] [place]  Evaluation Performed: Follow-up visit  PCP:  [name], MD  Cardiologist:     Electrophysiologist:  SK   Chief Complaint:  Atrial fib  History of Present Illness:    [name] is a 73 y.o. male who presents via audio/video conferencing for a telehealth visit today.  Since last being seen in our clinic for persistent atrial fibrillation, the patient reports consultation with Dr Greggory Brandy which was followed by AFib ablation (12/19) and DCCV 3/20  Failed to hold sinus rhythm and his dofetilide was stopped Discussions outlined in last note JA, amio, hybrid ablation but did not feel like endovascular approach would be fruitful with significant R atriopathy, noted on TEE although not described on TTE    C/o DOE at less than 3-4 min ( walking to mailbox)  Weight stable low 200's; scant edema  AFib now permanent. See Below   No bleeding  Struggles with depression and has been intolerant of many antidepressants in the past     Date Cr K Hgb  2/17  1.08 3.7   8/17  0.93 4.1   9/18 0.91 4.9 16.0  3/20 0.81 3.4 13.9    DATE TEST    4/13    Echo   EF 55-65 %   3/14    Myoview   Images Personally reviewed  Normal   11/19 Echo  LAE (45/2.05/26)  12/19 TEE  Severe R Atriopathy     The patient denies symptoms of fevers, chills, cough, or new SOB worrisome for COVID 19.    Past Medical History:  Diagnosis Date  . 1st degree AV block   . Atrial flutter ([place])    s/p ablation 2006  . CAD (coronary artery disease)    Mild 2 vessel disease per cath in 2006; myoview neg 2011  . Chronic anticoagulation    on Xarelto - started 11/12/11  . Depression 11-05-11   On Cymbalta  . GERD (gastroesophageal reflux disease)   . Hyperlipidemia   . Hypertension   . Normal nuclear stress test 2011  . Persistent atrial fibrillation    recent new occurrence 3/13>>Tikosyn  . PVC's (premature ventricular contractions)   . Right ventricular enlargement    Mild noted echo 3/13  . Sick sinus syndrome ([place]) 11/23/2012   Dr Caryl Comes  . Tobacco abuse     Past Surgical History:  Procedure Laterality Date  . ABLATION OF DYSRHYTHMIC FOCUS  07/07/2018  . ATRIAL FIBRILLATION ABLATION N/A 07/07/2018   Procedure: ATRIAL FIBRILLATION ABLATION;  Surgeon: [name], MD;  Location: [facility] CV LAB;  Service: Cardiovascular;  Laterality: N/A;  . CARDIAC CATHETERIZATION  11/1988, 12/07/2004   showing essentially normal left  ventricular function, moderate two-vessel disease (50% LAD after the third diagonal and a 60+% stenosis in the second obtuse marginal)         . CARDIOVERSION N/A 12/05/2011   Procedure: CARDIOVERSION;  Surgeon: Deboraha Sprang, MD;  Location: [facility] CATH LAB;  Service: Cardiovascular;  Laterality: N/A;  . CARDIOVERSION N/A 08/07/2018  Procedure: CARDIOVERSION;  Surgeon: Lelon Perla, MD;  Location: [facility] ENDOSCOPY;  Service: Cardiovascular;  Laterality: N/A;  . CARDIOVERSION N/A 10/09/2018   Procedure: CARDIOVERSION;  Surgeon: Pixie Casino, MD;  Location: [facility];  Service: Cardiovascular;  Laterality: N/A;  . [place]   left-retained hardware  . HERNIA REPAIR    . INGUINAL HERNIA REPAIR  01/28/2008   Recurrent right inguinal hernia Laparoscopic, preperitoneal repair of recurrent right inguinal hernia with mesh -- SURGEON:  Dr. Fanny Skates.  . INGUINAL HERNIA REPAIR  11/06/2011   Procedure: HERNIA REPAIR INGUINAL ADULT;  Surgeon: Adin Hector, MD;  Location: [facility] ORS;  Service: General;   Laterality: Left;  . PACEMAKER INSERTION  11/23/2012   Dr Caryl Comes  . PERMANENT PACEMAKER INSERTION N/A 11/23/2012   Procedure: PERMANENT PACEMAKER INSERTION;  Surgeon: Deboraha Sprang, MD;  Location: [facility] CATH LAB;  Service: Cardiovascular;  Laterality: N/A;  . TEE WITH CARDIOVERSION  2001, 2006   Invasive electrophysiology study, electroanatomical mapping,and radiofrequency catheter ablation. -- patient's arrhythmia was isthmus dependent flutter not withstanding the very long cycle length.  RF energy delivered across the cavotricuspid isthmus successfully interrupted conduction across the isthmus and eliminated the patient's sub-straight. PHYSICIAN:  Deboraha Sprang, M.D  . TEE WITHOUT CARDIOVERSION  12/31/2011   Procedure: TRANSESOPHAGEAL ECHOCARDIOGRAM (TEE);  Surgeon: Josue Hector, MD;  Location: [facility] ENDOSCOPY;  Service: Cardiovascular;  Laterality: N/A;  tba after for tikosyn  . TEE WITHOUT CARDIOVERSION N/A 07/07/2018   Procedure: TRANSESOPHAGEAL ECHOCARDIOGRAM (TEE);  Surgeon: Acie Fredrickson Wonda Cheng, MD;  Location: [facility] ENDOSCOPY;  Service: Cardiovascular;  Laterality: N/A;  . VASECTOMY      Current Outpatient Medications  Medication Sig Dispense Refill  . cetirizine (ZYRTEC) 10 MG tablet Take 10 mg by mouth daily.    . diphenhydrAMINE (BENADRYL) 25 MG tablet Take 50 mg by mouth at bedtime.    . fluticasone (FLONASE) 50 MCG/ACT nasal spray Place 1-2 sprays into both nostrils at bedtime.     . furosemide (LASIX) 40 MG tablet Take 3 tablets (120 mg total) by mouth daily. 270 tablet 3  . metolazone (ZAROXOLYN) 2.5 MG tablet Take 2.5 mg by mouth as needed (edema).    [name] OVER THE COUNTER MEDICATION Take 1 Dose by mouth daily as needed (pain, mood).     [name] OVER THE COUNTER MEDICATION Take 1 tablet by mouth daily.    [name] OVER THE COUNTER MEDICATION Take 1 Scoop by mouth daily.    . rivaroxaban (XARELTO) 20 MG TABS tablet Take 1 tablet (20 mg total) by mouth daily with supper. 90 tablet 1  . VIAGRA 100 MG tablet  Take 100 mg by mouth as needed for erectile dysfunction.      No current facility-administered medications for this visit.     Allergies:   Horse-derived products   Social History:  The patient  reports that he quit smoking about 52 years ago. His smoking use included cigarettes. He quit after 4.00 years of use. He has never used smokeless tobacco. He reports current alcohol use of about 2.0 standard drinks of alcohol per week. He reports that he does not use drugs.   Family History:  The patient's   family history includes Atrial fibrillation in his mother; Cancer in his father, paternal uncle, and sister; Coronary artery disease in his father; Heart attack (age of onset: 1) in his father; Heart failure (age of onset: 67) in his mother; Kidney disease in his father.  ROS:  Please see the history of present illness.   All other systems are personally reviewed and negative.    Exam:    Vital Signs:  BP 118/73   Pulse 65   Ht 6' (1.829 m)   Wt 202 lb (91.6 kg)   BMI 27.40 kg/m  *   Well appearing, alert and conversant, regular work of breathing,  good skin color Eyes- anicteric, neuro- grossly intact, skin- no apparent rash or lesions or cyanosis, mouth- oral mucosa is pink   Labs/Other Tests and Data Reviewed:    Recent Labs: 07/08/2018: Magnesium 2.0 09/23/2018: BUN 19; Creatinine, Ser 0.81; Platelets 239 10/09/2018: Hemoglobin 13.9; Potassium 3.4; Sodium 141   Wt Readings from Last 3 Encounters:  01/25/19 202 lb (91.6 kg)  10/09/18 208 lb (94.3 kg)  09/23/18 208 lb (94.3 kg)     Other studies personally reviewed: Additional studies/ records that were reviewed today include: As above   Last device remote is reviewed from [place] PDF dated 6/20 which reveals normal device function,   arrhythmias - atrail fibrillation long term persistent Vp 43<<33 %   ASSESSMENT & PLAN:    Sinus node dysfunction  Atrial  Fibrillation now permanent  Pacemaker  BSx  Hypertension   Deconditioning  1AVB profound/ IVCD  HFpEF  Depression  Anemia  Low K  Pt is quasi resigned to atrial fibrillation   Maybe better in sinus but not sure, but is reluctant to try amiodarone ( side effects)  And not interested in cardioversion  He will let us know  BP well controlled  Not sure that fluid restriction and diuresis are helpful for symptoms although clearly for edema  Spoke also about exercise-encouraged  His last labs notable for K 3.4 and Hgb 16.3>>13.9 12/19>>3/20     COVID 19 screen The patient denies symptoms of COVID 19 at this time.  The importance of social distancing was discussed today.  Follow-up:  31m Next remote: As Scheduled   Current medicines are reviewed at length with the patient today.   The patient does not have concerns regarding his medicines.  The following changes were made today:  none  Labs/ tests ordered today include: CBC and BMET ( did not mention this to him when we spoke )  No orders of the defined types were placed in this encounter.   Future tests ( post COVID )     Patient Risk:  after full review of this patients clinical status, I feel that they are at moderate risk at this time.  Today, I have spent  30  minutes with the patient with telehealth technology discussing the above. More than 50% of 45  min was spent in counseling related to the above   Signed, Virl Axe, MD  01/25/2019 2:07 PM     [place] [address] [phone] (office) [phone] (fax)

## 2019-01-25 NOTE — Telephone Encounter (Signed)
    COVID-19 Pre-Screening Questions:  . In the past 7 to 10 days have you had a cough,  shortness of breath, headache, congestion, fever (100 or greater) body aches, chills, sore throat, or sudden loss of taste or sense of smell? . Have you been around anyone with known Covid 19. . Have you been around anyone who is awaiting Covid 19 test results in the past 7 to 10 days? . Have you been around anyone who has been exposed to Covid 19, or has mentioned symptoms of Covid 19 within the past 7 to 10 days?  If you have any concerns/questions about symptoms patients report during screening (either on the phone or at threshold). Contact the provider seeing the patient or DOD for further guidance.  If neither are available contact a member of the leadership team.           Contacted patient via telephone call. NO to all Covid 19 questions and has a mask. KB

## 2019-01-25 NOTE — Telephone Encounter (Signed)
CMA will address when pt is called to set up his appt.

## 2019-01-25 NOTE — Patient Instructions (Signed)
Called pt and discussed the following. He has agreed to come by the office for lab work. He has verbalized understanding and stated he has no questions.   Medication Instructions:  Your physician recommends that you continue on your current medications as directed. Please refer to the Current Medication list given to you today.   Labwork: You will have labs drawn today: CBC and BMP  Testing/Procedures: None ordered.   Follow-Up: Your physician recommends that you schedule a follow-up appointment in: 6 months with Dr. Caryl Comes  Any Other Special Instructions Will Be Listed Below (If Applicable).     If you need a refill on your cardiac medications before your next appointment, please call your pharmacy.

## 2019-01-25 NOTE — Telephone Encounter (Signed)
New message:    Patient has a appt at 1:30 and having trouble with his my chart

## 2019-01-27 ENCOUNTER — Other Ambulatory Visit: Payer: Self-pay

## 2019-01-27 ENCOUNTER — Other Ambulatory Visit: Payer: Medicare Other | Admitting: *Deleted

## 2019-01-27 DIAGNOSIS — Z950 Presence of cardiac pacemaker: Secondary | ICD-10-CM

## 2019-01-27 DIAGNOSIS — R0609 Other forms of dyspnea: Secondary | ICD-10-CM | POA: Diagnosis not present

## 2019-01-27 DIAGNOSIS — I4819 Other persistent atrial fibrillation: Secondary | ICD-10-CM | POA: Diagnosis not present

## 2019-01-27 DIAGNOSIS — F329 Major depressive disorder, single episode, unspecified: Secondary | ICD-10-CM

## 2019-01-27 DIAGNOSIS — F32A Depression, unspecified: Secondary | ICD-10-CM

## 2019-01-28 LAB — CBC
Hematocrit: 46.9 % (ref 37.5–51.0)
Hemoglobin: 16.6 g/dL (ref 13.0–17.7)
MCH: 32.2 pg (ref 26.6–33.0)
MCHC: 35.4 g/dL (ref 31.5–35.7)
MCV: 91 fL (ref 79–97)
Platelets: 223 10*3/uL (ref 150–450)
RBC: 5.16 x10E6/uL (ref 4.14–5.80)
RDW: 12.9 % (ref 11.6–15.4)
WBC: 5.8 10*3/uL (ref 3.4–10.8)

## 2019-01-28 LAB — BASIC METABOLIC PANEL
BUN/Creatinine Ratio: 15 (ref 10–24)
BUN: 15 mg/dL (ref 8–27)
CO2: 28 mmol/L (ref 20–29)
Calcium: 10.1 mg/dL (ref 8.6–10.2)
Chloride: 97 mmol/L (ref 96–106)
Creatinine, Ser: 1 mg/dL (ref 0.76–1.27)
GFR calc Af Amer: 85 mL/min/{1.73_m2} (ref >59)
GFR calc non Af Amer: 74 mL/min/{1.73_m2} (ref >59)
Glucose: 88 mg/dL (ref 65–99)
Potassium: 4.3 mmol/L (ref 3.5–5.2)
Sodium: 143 mmol/L (ref 134–144)

## 2019-04-05 ENCOUNTER — Ambulatory Visit (INDEPENDENT_AMBULATORY_CARE_PROVIDER_SITE_OTHER): Payer: Medicare Other | Admitting: *Deleted

## 2019-04-05 DIAGNOSIS — I495 Sick sinus syndrome: Secondary | ICD-10-CM | POA: Diagnosis not present

## 2019-04-05 LAB — CUP PACEART REMOTE DEVICE CHECK
Battery Remaining Longevity: 54 mo
Battery Remaining Percentage: 58 %
Brady Statistic RA Percent Paced: 1 %
Brady Statistic RV Percent Paced: 45 %
Date Time Interrogation Session: 20200831123300
Implantable Lead Implant Date: 20140421
Implantable Lead Implant Date: 20140421
Implantable Lead Location: 753859
Implantable Lead Location: 753860
Implantable Lead Model: 5076
Implantable Lead Model: 5076
Implantable Pulse Generator Implant Date: 20140421
Lead Channel Impedance Value: 571 Ohm
Lead Channel Impedance Value: 626 Ohm
Lead Channel Pacing Threshold Amplitude: 0.4 V
Lead Channel Pacing Threshold Amplitude: 1 V
Lead Channel Pacing Threshold Pulse Width: 0.4 ms
Lead Channel Pacing Threshold Pulse Width: 0.4 ms
Lead Channel Setting Pacing Amplitude: 1.5 V
Lead Channel Setting Pacing Amplitude: 2.5 V
Lead Channel Setting Pacing Pulse Width: 0.4 ms
Lead Channel Setting Sensing Sensitivity: 2.5 mV
Pulse Gen Serial Number: 112305

## 2019-04-14 ENCOUNTER — Encounter: Payer: Self-pay | Admitting: Cardiology

## 2019-04-14 ENCOUNTER — Other Ambulatory Visit: Payer: Self-pay | Admitting: Internal Medicine

## 2019-04-14 NOTE — Telephone Encounter (Signed)
Pt last saw Dr Caryl Comes 01/25/19 telemedicine visit Covid-19, last labs 01/27/19 Creat 1.0, age 73, weight 91.6kg, CrCl 82.69, based on CrCl pt is on appropriate dosage of Xarelto 20mg  QD.  Will refill rx.

## 2019-04-14 NOTE — Progress Notes (Signed)
Remote pacemaker transmission.   

## 2019-04-27 ENCOUNTER — Other Ambulatory Visit: Payer: Self-pay | Admitting: Internal Medicine

## 2019-07-05 ENCOUNTER — Ambulatory Visit (INDEPENDENT_AMBULATORY_CARE_PROVIDER_SITE_OTHER): Payer: Medicare Other | Admitting: *Deleted

## 2019-07-05 DIAGNOSIS — Z950 Presence of cardiac pacemaker: Secondary | ICD-10-CM | POA: Diagnosis not present

## 2019-07-05 LAB — CUP PACEART REMOTE DEVICE CHECK
Battery Remaining Longevity: 54 mo
Battery Remaining Percentage: 55 %
Brady Statistic RA Percent Paced: 0 %
Brady Statistic RV Percent Paced: 45 %
Date Time Interrogation Session: 20201130005100
Implantable Lead Implant Date: 20140421
Implantable Lead Implant Date: 20140421
Implantable Lead Location: 753859
Implantable Lead Location: 753860
Implantable Lead Model: 5076
Implantable Lead Model: 5076
Implantable Pulse Generator Implant Date: 20140421
Lead Channel Impedance Value: 540 Ohm
Lead Channel Impedance Value: 612 Ohm
Lead Channel Pacing Threshold Amplitude: 0.4 V
Lead Channel Pacing Threshold Amplitude: 1 V
Lead Channel Pacing Threshold Pulse Width: 0.4 ms
Lead Channel Pacing Threshold Pulse Width: 0.4 ms
Lead Channel Setting Pacing Amplitude: 1.5 V
Lead Channel Setting Pacing Amplitude: 2.5 V
Lead Channel Setting Pacing Pulse Width: 0.4 ms
Lead Channel Setting Sensing Sensitivity: 2.5 mV
Pulse Gen Serial Number: 112305

## 2019-07-08 DIAGNOSIS — H26493 Other secondary cataract, bilateral: Secondary | ICD-10-CM | POA: Diagnosis not present

## 2019-07-08 DIAGNOSIS — H02834 Dermatochalasis of left upper eyelid: Secondary | ICD-10-CM | POA: Diagnosis not present

## 2019-07-08 DIAGNOSIS — H02831 Dermatochalasis of right upper eyelid: Secondary | ICD-10-CM | POA: Diagnosis not present

## 2019-07-08 DIAGNOSIS — Z961 Presence of intraocular lens: Secondary | ICD-10-CM | POA: Diagnosis not present

## 2019-07-08 DIAGNOSIS — H40013 Open angle with borderline findings, low risk, bilateral: Secondary | ICD-10-CM | POA: Diagnosis not present

## 2019-07-08 DIAGNOSIS — H524 Presbyopia: Secondary | ICD-10-CM | POA: Diagnosis not present

## 2019-07-28 ENCOUNTER — Telehealth: Payer: Self-pay

## 2019-07-28 NOTE — Progress Notes (Signed)
PPM Remote  

## 2019-07-28 NOTE — Telephone Encounter (Signed)
LVM for pt regarding changing his appt to in clinic to virtual with Dr Caryl Comes on 1/7; Pt was instructed to call back and reschedule if he prefers in clinic visit.

## 2019-08-10 DIAGNOSIS — I495 Sick sinus syndrome: Secondary | ICD-10-CM | POA: Insufficient documentation

## 2019-08-12 ENCOUNTER — Telehealth (INDEPENDENT_AMBULATORY_CARE_PROVIDER_SITE_OTHER): Payer: Medicare Other | Admitting: Internal Medicine

## 2019-08-12 ENCOUNTER — Other Ambulatory Visit: Payer: Self-pay

## 2019-08-12 VITALS — BP 127/80 | HR 60 | Wt 193.0 lb

## 2019-08-12 DIAGNOSIS — I495 Sick sinus syndrome: Secondary | ICD-10-CM | POA: Diagnosis not present

## 2019-08-12 DIAGNOSIS — Z950 Presence of cardiac pacemaker: Secondary | ICD-10-CM | POA: Diagnosis not present

## 2019-08-12 DIAGNOSIS — I4821 Permanent atrial fibrillation: Secondary | ICD-10-CM | POA: Diagnosis not present

## 2019-08-12 NOTE — Patient Instructions (Signed)
Phone call to pt for follow up after provider's virtual visit.  Pt advised of need for lab work.  Appt scheduled for 08/18/2019 and advised pt he may come in anytime between 8am and 430pm.  Pt advised of 6 month appt and will send out reminder letter to schedule.  Pt verbalized understanding of instructions and agrees with plan.

## 2019-08-12 NOTE — Progress Notes (Signed)
Electrophysiology TeleHealth Note   Due to national recommendations of social distancing due to COVID 19, an audio/video telehealth visit is felt to be most appropriate for this patient at this time.  See MyChart message from today for the patient's consent to telehealth for [facility].   Date:  08/12/2019   ID:  [name], DOB 11-02-1945, MRN [id number]  Location: patient's home  Provider location: [address] [place]  Evaluation Performed: Follow-up visit  PCP:  [name], MD  Cardiologist:     Electrophysiologist:  SK   Chief Complaint:  Atrial fib  History of Present Illness:    [name] is a 74 y.o. male who presents via audio/video conferencing for a telehealth visit today.  Since last being seen in our clinic for persistent atrial fibrillation, the patient reports consultation with Dr Greggory Brandy which was followed by AFib ablation (12/19) and DCCV 3/20  Failed to hold sinus rhythm and his dofetilide was stopped Discussions outlined in last note JA, amio, hybrid ablation but did not feel like endovascular approach would be fruitful with significant R atriopathy, noted on TEE although not described on TTE  Remains not very active.  Dyspnea stable walking to the mailbox.  No edema or chest pain.  Occasional palpitations and pounding.  No tachypalpitations. No bleeding.    He is looking into the use of [place]. John's wort for melancholy.     Date Cr K Hgb  2/17  1.08 3.7   8/17  0.93 4.1   9/18 0.91 4.9 16.0  3/20 0.81 3.4 13.9  6/20 1.0 4.3 16.6    DATE TEST    4/13    Echo   EF 55-65 %   3/14    Myoview   Images Personally reviewed  Normal   11/19 Echo  LAE (45/2.05/26)  12/19 TEE 60-65% Severe R Atriopathy     The patient denies symptoms of fevers, chills, cough, or new SOB worrisome for COVID 19.    Past Medical History:  Diagnosis Date  . 1st degree AV block   . Atrial flutter ([place])    s/p ablation 2006  . CAD  (coronary artery disease)    Mild 2 vessel disease per cath in 2006; myoview neg 2011  . Chronic anticoagulation    on Xarelto - started 11/12/11  . Depression 11-05-11   On Cymbalta  . GERD (gastroesophageal reflux disease)   . Hyperlipidemia   . Hypertension   . Normal nuclear stress test 2011  . Persistent atrial fibrillation    recent new occurrence 3/13>>Tikosyn  . PVC's (premature ventricular contractions)   . Right ventricular enlargement    Mild noted echo 3/13  . Sick sinus syndrome ([place]) 11/23/2012   Dr Caryl Comes  . Tobacco abuse     Past Surgical History:  Procedure Laterality Date  . ABLATION OF DYSRHYTHMIC FOCUS  07/07/2018  . ATRIAL FIBRILLATION ABLATION N/A 07/07/2018   Procedure: ATRIAL FIBRILLATION ABLATION;  Surgeon: [name], MD;  Location: [facility] CV LAB;  Service: Cardiovascular;  Laterality: N/A;  . CARDIAC CATHETERIZATION  11/1988, 12/07/2004   showing essentially normal left  ventricular function, moderate two-vessel disease (50% LAD after the third diagonal and a 60+% stenosis in the second obtuse marginal)         . CARDIOVERSION N/A 12/05/2011   Procedure: CARDIOVERSION;  Surgeon: Deboraha Sprang, MD;  Location: [facility] CATH LAB;  Service: Cardiovascular;  Laterality: N/A;  .  CARDIOVERSION N/A 08/07/2018   Procedure: CARDIOVERSION;  Surgeon: Lelon Perla, MD;  Location: [facility] ENDOSCOPY;  Service: Cardiovascular;  Laterality: N/A;  . CARDIOVERSION N/A 10/09/2018   Procedure: CARDIOVERSION;  Surgeon: Pixie Casino, MD;  Location: [facility] ENDOSCOPY;  Service: Cardiovascular;  Laterality: N/A;  . [place]   left-retained hardware  . HERNIA REPAIR    . INGUINAL HERNIA REPAIR  01/28/2008   Recurrent right inguinal hernia Laparoscopic, preperitoneal repair of recurrent right inguinal hernia with mesh -- SURGEON:  Dr. Fanny Skates.  . INGUINAL HERNIA REPAIR  11/06/2011   Procedure: HERNIA REPAIR INGUINAL ADULT;  Surgeon: Adin Hector, MD;  Location: [facility] ORS;   Service: General;  Laterality: Left;  . PACEMAKER INSERTION  11/23/2012   Dr Caryl Comes  . PERMANENT PACEMAKER INSERTION N/A 11/23/2012   Procedure: PERMANENT PACEMAKER INSERTION;  Surgeon: Deboraha Sprang, MD;  Location: [facility] CATH LAB;  Service: Cardiovascular;  Laterality: N/A;  . TEE WITH CARDIOVERSION  2001, 2006   Invasive electrophysiology study, electroanatomical mapping,and radiofrequency catheter ablation. -- patient's arrhythmia was isthmus dependent flutter not withstanding the very long cycle length.  RF energy delivered across the cavotricuspid isthmus successfully interrupted conduction across the isthmus and eliminated the patient's sub-straight. PHYSICIAN:  Deboraha Sprang, M.D  . TEE WITHOUT CARDIOVERSION  12/31/2011   Procedure: TRANSESOPHAGEAL ECHOCARDIOGRAM (TEE);  Surgeon: Josue Hector, MD;  Location: [facility] ENDOSCOPY;  Service: Cardiovascular;  Laterality: N/A;  tba after for tikosyn  . TEE WITHOUT CARDIOVERSION N/A 07/07/2018   Procedure: TRANSESOPHAGEAL ECHOCARDIOGRAM (TEE);  Surgeon: Acie Fredrickson Wonda Cheng, MD;  Location: [facility] ENDOSCOPY;  Service: Cardiovascular;  Laterality: N/A;  . VASECTOMY      Current Outpatient Medications  Medication Sig Dispense Refill  . cetirizine (ZYRTEC) 10 MG tablet Take 10 mg by mouth daily.    . diphenhydrAMINE (BENADRYL) 25 MG tablet Take 50 mg by mouth at bedtime.    . fluticasone (FLONASE) 50 MCG/ACT nasal spray Place 1-2 sprays into both nostrils at bedtime.     . furosemide (LASIX) 40 MG tablet TAKE 3 TABLETS DAILY 270 tablet 2  . metolazone (ZAROXOLYN) 2.5 MG tablet Take 2.5 mg by mouth as needed (edema).    [name] OVER THE COUNTER MEDICATION Take 1 Dose by mouth daily as needed (pain, mood).     [name] OVER THE COUNTER MEDICATION Take 1 tablet by mouth daily.    [name] OVER THE COUNTER MEDICATION Take 1 Scoop by mouth daily.    [name] VIAGRA 100 MG tablet Take 100 mg by mouth as needed for erectile dysfunction.     Alveda Reasons 20 MG TABS tablet TAKE 1 TABLET DAILY WITH  SUPPER 90 tablet 2   No current facility-administered medications for this visit.    Allergies:   Horse-derived products   Social History:  The patient  reports that he quit smoking about 53 years ago. His smoking use included cigarettes. He quit after 4.00 years of use. He has never used smokeless tobacco. He reports current alcohol use of about 2.0 standard drinks of alcohol per week. He reports that he does not use drugs.   Family History:  The patient's   family history includes Atrial fibrillation in his mother; Cancer in his father, paternal uncle, and sister; Coronary artery disease in his father; Heart attack (age of onset: 72) in his father; Heart failure (age of onset: 62) in his mother; Kidney disease in his father.   ROS:  Please see the history  of present illness.   All other systems are personally reviewed and negative.    Exam:    Vital Signs:  BP 127/80   Pulse 60   Wt 193 lb (87.5 kg)   BMI 26.18 kg/m  *   Well appearing, alert and conversant, regular work of breathing,  good skin color Eyes- anicteric, neuro- grossly intact, skin- no apparent rash or lesions or cyanosis, mouth- oral mucosa is pink   Labs/Other Tests and Data Reviewed:    Recent Labs: 01/27/2019: BUN 15; Creatinine, Ser 1.00; Hemoglobin 16.6; Platelets 223; Potassium 4.3; Sodium 143   Wt Readings from Last 3 Encounters:  08/12/19 193 lb (87.5 kg)  01/25/19 202 lb (91.6 kg)  10/09/18 208 lb (94.3 kg)     Other studies personally reviewed: Additional studies/ records that were reviewed today include: As above   Last device remote is reviewed from [place] PDF dated 6/20 which reveals normal device function,   arrhythmias - atrail fibrillation long term persistent Vp 43<<33 %   ASSESSMENT & PLAN:    Sinus node dysfunction  Atrial  Fibrillation now permanent  Pacemaker  BSx  Hypertension  Deconditioning   HFpEF  Depression  Anemia  Low K   AFib permanent,  Volume  status pretty good, using metolazone infrequently,  Discussed its physiology   On Anticoagulation;  No bleeding issues x while shaving  BP well controlled         COVID 19 screen The patient denies symptoms of COVID 19 at this time.  The importance of social distancing was discussed today.  Follow-up: 65m OV Next remote: As Scheduled     Future tests ( post COVID )     Patient Risk:  after full review of this patients clinical status, I feel that they are at moderate risk at this time.  Today, I have spent  17  minutes with the patient with telehealth technology discussing the above. We spent more than 50% of our >25 min visit in face to face counseling regarding the above    Signed, Virl Axe, MD  08/12/2019 2:07 PM     [place] [address] [phone] (office) [phone] (fax)

## 2019-08-18 ENCOUNTER — Other Ambulatory Visit: Payer: Self-pay

## 2019-08-18 ENCOUNTER — Other Ambulatory Visit: Payer: Medicare Other | Admitting: *Deleted

## 2019-08-18 DIAGNOSIS — Z85828 Personal history of other malignant neoplasm of skin: Secondary | ICD-10-CM | POA: Diagnosis not present

## 2019-08-18 DIAGNOSIS — Z950 Presence of cardiac pacemaker: Secondary | ICD-10-CM

## 2019-08-18 DIAGNOSIS — D485 Neoplasm of uncertain behavior of skin: Secondary | ICD-10-CM | POA: Diagnosis not present

## 2019-08-18 DIAGNOSIS — L821 Other seborrheic keratosis: Secondary | ICD-10-CM | POA: Diagnosis not present

## 2019-08-18 DIAGNOSIS — L309 Dermatitis, unspecified: Secondary | ICD-10-CM | POA: Diagnosis not present

## 2019-08-18 DIAGNOSIS — L578 Other skin changes due to chronic exposure to nonionizing radiation: Secondary | ICD-10-CM | POA: Diagnosis not present

## 2019-08-18 DIAGNOSIS — Z808 Family history of malignant neoplasm of other organs or systems: Secondary | ICD-10-CM | POA: Diagnosis not present

## 2019-08-18 DIAGNOSIS — D2271 Melanocytic nevi of right lower limb, including hip: Secondary | ICD-10-CM | POA: Diagnosis not present

## 2019-08-18 DIAGNOSIS — I495 Sick sinus syndrome: Secondary | ICD-10-CM

## 2019-08-18 DIAGNOSIS — D2262 Melanocytic nevi of left upper limb, including shoulder: Secondary | ICD-10-CM | POA: Diagnosis not present

## 2019-08-18 DIAGNOSIS — L111 Transient acantholytic dermatosis [Grover]: Secondary | ICD-10-CM | POA: Diagnosis not present

## 2019-08-18 DIAGNOSIS — I4821 Permanent atrial fibrillation: Secondary | ICD-10-CM

## 2019-08-18 DIAGNOSIS — D225 Melanocytic nevi of trunk: Secondary | ICD-10-CM | POA: Diagnosis not present

## 2019-08-18 LAB — CBC
Hematocrit: 43.9 % (ref 37.5–51.0)
Hemoglobin: 15.7 g/dL (ref 13.0–17.7)
MCH: 33.9 pg — ABNORMAL HIGH (ref 26.6–33.0)
MCHC: 35.8 g/dL — ABNORMAL HIGH (ref 31.5–35.7)
MCV: 95 fL (ref 79–97)
Platelets: 211 10*3/uL (ref 150–450)
RBC: 4.63 x10E6/uL (ref 4.14–5.80)
RDW: 12.5 % (ref 11.6–15.4)
WBC: 5.3 10*3/uL (ref 3.4–10.8)

## 2019-08-18 LAB — BASIC METABOLIC PANEL
BUN/Creatinine Ratio: 19 (ref 10–24)
BUN: 17 mg/dL (ref 8–27)
CO2: 28 mmol/L (ref 20–29)
Calcium: 9.1 mg/dL (ref 8.6–10.2)
Chloride: 97 mmol/L (ref 96–106)
Creatinine, Ser: 0.89 mg/dL (ref 0.76–1.27)
GFR calc Af Amer: 97 mL/min/{1.73_m2} (ref >59)
GFR calc non Af Amer: 84 mL/min/{1.73_m2} (ref >59)
Glucose: 91 mg/dL (ref 65–99)
Potassium: 3.8 mmol/L (ref 3.5–5.2)
Sodium: 140 mmol/L (ref 134–144)

## 2019-08-25 ENCOUNTER — Ambulatory Visit: Payer: Medicare Other | Attending: Internal Medicine

## 2019-08-25 DIAGNOSIS — Z23 Encounter for immunization: Secondary | ICD-10-CM | POA: Insufficient documentation

## 2019-08-25 NOTE — Progress Notes (Signed)
   Covid-19 Vaccination Clinic  Name:  [name]    MRN: [id number] DOB: 1946/04/20  08/25/2019  [name] was observed post Covid-19 immunization for 15 minutes without incidence. He was provided with Vaccine Information Sheet and instruction to access the V-Safe system.   [name] was instructed to call 911 with any severe reactions post vaccine: [name] Difficulty breathing  . Swelling of your face and throat  . A fast heartbeat  . A bad rash all over your body  . Dizziness and weakness    Immunizations Administered    Name Date Dose VIS Date Route   Pfizer COVID-19 Vaccine 08/25/2019 12:02 PM 0.3 mL 07/16/2019 Intramuscular   Manufacturer: [place]   Lot: [id number]   [place]: [id number]

## 2019-09-13 ENCOUNTER — Ambulatory Visit: Payer: Medicare Other | Attending: Internal Medicine

## 2019-09-13 DIAGNOSIS — Z23 Encounter for immunization: Secondary | ICD-10-CM | POA: Insufficient documentation

## 2019-09-13 NOTE — Progress Notes (Signed)
   Covid-19 Vaccination Clinic  Name:  [name]    MRN: [id number] DOB: [birth year]  09/13/2019  [name] was observed post Covid-19 immunization for 15 minutes without incidence. He was provided with Vaccine Information Sheet and instruction to access the V-Safe system.   [name] was instructed to call 911 with any severe reactions post vaccine: [name] Difficulty breathing  . Swelling of your face and throat  . A fast heartbeat  . A bad rash all over your body  . Dizziness and weakness    Immunizations Administered    Name Date Dose VIS Date Route   Pfizer COVID-19 Vaccine 09/13/2019  6:17 PM 0.3 mL 07/16/2019 Intramuscular   Manufacturer: [place]   Lot: [id number]   [place]: [id number]

## 2019-09-16 DIAGNOSIS — H26491 Other secondary cataract, right eye: Secondary | ICD-10-CM | POA: Diagnosis not present

## 2019-10-04 ENCOUNTER — Ambulatory Visit (INDEPENDENT_AMBULATORY_CARE_PROVIDER_SITE_OTHER): Payer: Medicare Other | Admitting: *Deleted

## 2019-10-04 DIAGNOSIS — Z950 Presence of cardiac pacemaker: Secondary | ICD-10-CM

## 2019-10-04 LAB — CUP PACEART REMOTE DEVICE CHECK
Battery Remaining Longevity: 48 mo
Battery Remaining Percentage: 52 %
Brady Statistic RA Percent Paced: 0 %
Brady Statistic RV Percent Paced: 44 %
Date Time Interrogation Session: 20210301005100
Implantable Lead Implant Date: 20140421
Implantable Lead Implant Date: 20140421
Implantable Lead Location: 753859
Implantable Lead Location: 753860
Implantable Lead Model: 5076
Implantable Lead Model: 5076
Implantable Pulse Generator Implant Date: 20140421
Lead Channel Impedance Value: 537 Ohm
Lead Channel Impedance Value: 621 Ohm
Lead Channel Pacing Threshold Amplitude: 0.4 V
Lead Channel Pacing Threshold Amplitude: 1.2 V
Lead Channel Pacing Threshold Pulse Width: 0.4 ms
Lead Channel Pacing Threshold Pulse Width: 0.4 ms
Lead Channel Setting Pacing Amplitude: 1.5 V
Lead Channel Setting Pacing Amplitude: 2.5 V
Lead Channel Setting Pacing Pulse Width: 0.4 ms
Lead Channel Setting Sensing Sensitivity: 2.5 mV
Pulse Gen Serial Number: 112305

## 2019-10-05 NOTE — Progress Notes (Signed)
PPM Remote  

## 2019-10-26 DIAGNOSIS — L111 Transient acantholytic dermatosis [Grover]: Secondary | ICD-10-CM | POA: Diagnosis not present

## 2019-10-26 DIAGNOSIS — M720 Palmar fascial fibromatosis [Dupuytren]: Secondary | ICD-10-CM | POA: Diagnosis not present

## 2019-10-26 DIAGNOSIS — I4891 Unspecified atrial fibrillation: Secondary | ICD-10-CM | POA: Diagnosis not present

## 2019-10-26 DIAGNOSIS — Z6827 Body mass index (BMI) 27.0-27.9, adult: Secondary | ICD-10-CM | POA: Diagnosis not present

## 2019-10-26 DIAGNOSIS — Z136 Encounter for screening for cardiovascular disorders: Secondary | ICD-10-CM | POA: Diagnosis not present

## 2019-10-26 DIAGNOSIS — R739 Hyperglycemia, unspecified: Secondary | ICD-10-CM | POA: Diagnosis not present

## 2019-10-26 DIAGNOSIS — Z125 Encounter for screening for malignant neoplasm of prostate: Secondary | ICD-10-CM | POA: Diagnosis not present

## 2019-10-26 DIAGNOSIS — R7309 Other abnormal glucose: Secondary | ICD-10-CM | POA: Diagnosis not present

## 2019-10-26 DIAGNOSIS — J309 Allergic rhinitis, unspecified: Secondary | ICD-10-CM | POA: Diagnosis not present

## 2019-10-26 DIAGNOSIS — Z0000 Encounter for general adult medical examination without abnormal findings: Secondary | ICD-10-CM | POA: Diagnosis not present

## 2019-10-26 DIAGNOSIS — N529 Male erectile dysfunction, unspecified: Secondary | ICD-10-CM | POA: Diagnosis not present

## 2019-11-01 DIAGNOSIS — Z1211 Encounter for screening for malignant neoplasm of colon: Secondary | ICD-10-CM | POA: Diagnosis not present

## 2019-11-09 ENCOUNTER — Other Ambulatory Visit: Payer: Self-pay | Admitting: Internal Medicine

## 2019-11-09 MED ORDER — METOLAZONE 2.5 MG PO TABS: 2.5000 mg | ORAL_TABLET | ORAL | 2 refills | Status: DC | PRN

## 2019-12-01 DIAGNOSIS — R972 Elevated prostate specific antigen [PSA]: Secondary | ICD-10-CM | POA: Diagnosis not present

## 2019-12-13 DIAGNOSIS — N451 Epididymitis: Secondary | ICD-10-CM | POA: Diagnosis not present

## 2020-01-03 LAB — CUP PACEART REMOTE DEVICE CHECK
Battery Remaining Longevity: 48 mo
Battery Remaining Percentage: 50 %
Brady Statistic RA Percent Paced: 0 %
Brady Statistic RV Percent Paced: 44 %
Date Time Interrogation Session: 20210531005100
Implantable Lead Implant Date: 20140421
Implantable Lead Implant Date: 20140421
Implantable Lead Location: 753859
Implantable Lead Location: 753860
Implantable Lead Model: 5076
Implantable Lead Model: 5076
Implantable Pulse Generator Implant Date: 20140421
Lead Channel Impedance Value: 544 Ohm
Lead Channel Impedance Value: 636 Ohm
Lead Channel Pacing Threshold Amplitude: 0.4 V
Lead Channel Pacing Threshold Amplitude: 1.2 V
Lead Channel Pacing Threshold Pulse Width: 0.4 ms
Lead Channel Pacing Threshold Pulse Width: 0.4 ms
Lead Channel Setting Pacing Amplitude: 1.6 V
Lead Channel Setting Pacing Amplitude: 2.5 V
Lead Channel Setting Pacing Pulse Width: 0.4 ms
Lead Channel Setting Sensing Sensitivity: 2.5 mV
Pulse Gen Serial Number: 112305

## 2020-01-05 ENCOUNTER — Ambulatory Visit (INDEPENDENT_AMBULATORY_CARE_PROVIDER_SITE_OTHER): Payer: Medicare Other | Admitting: *Deleted

## 2020-01-05 DIAGNOSIS — I495 Sick sinus syndrome: Secondary | ICD-10-CM | POA: Diagnosis not present

## 2020-01-10 ENCOUNTER — Other Ambulatory Visit: Payer: Self-pay | Admitting: Internal Medicine

## 2020-01-10 NOTE — Telephone Encounter (Signed)
Pt last saw Dr Caryl Comes 08/12/19, last labs 08/18/19 Creat 0.89, age 74, weight 87.5kg, CrCl 88.76, based on CrCl pt is on appropriate dosage of Xarelto 20mg  QD.  Will refill rx.

## 2020-01-11 NOTE — Progress Notes (Signed)
Remote pacemaker transmission.   

## 2020-02-13 ENCOUNTER — Other Ambulatory Visit: Payer: Self-pay | Admitting: Internal Medicine

## 2020-02-15 DIAGNOSIS — I5030 Unspecified diastolic (congestive) heart failure: Secondary | ICD-10-CM | POA: Insufficient documentation

## 2020-02-17 NOTE — Progress Notes (Addendum)
Patient Care Team: [name], MD as PCP - General (Family Medicine)   HPI  [name] is a 74 y.o. male Seen in followup for atrial fibrillation for which he took [place], underwent AFib ablation Greggory Brandy- 12/19) and DCCV 3/20 but failed to hold sinus and with severe R atriopathy decided to let his afib be permanent  Known coronary artery disease catheterization 2006 demonstrated moderate two-vessel disease with a 50% LAD-mid to distal and a 60% stenosis in the OM  anticoagulation with Rivaroxaban no bleeding  Still with shortness of breath/dyspnea on exertion.  Worsening.  Some edema.  Does not note that his breathlessness is improved following metolazone and 2 or 3 pounds of weight loss.  No chest pain.  Hx of  chronotropic incompetence-- Chiropractor (DOI 4/14).    Date Cr K Hgb  2/17  1.08 3.7   8/17  0.93 4.1   9/18 0.91 4.9 16.0  3/20 0.81 3.4 13.9  6/20 1.0 4.3 16.6  1/21 0.89 3.8 15.7    DATE TEST    4/13 Echo EF 55-65 %   3/14 Myoview  Images Personally reviewed Normal   11/19 Echo  LAE (45/2.05/26)  12/19 TEE 60-65% Severe R Atriopathy     Thromboembolic risk factors ( age  -2, HTN-1, Vasc disease -1) for a CHADSVASc Score of 4     Past Medical History:  Diagnosis Date  . 1st degree AV block   . Atrial flutter ([place])    s/p ablation 2006  . CAD (coronary artery disease)    Mild 2 vessel disease per cath in 2006; myoview neg 2011  . Chronic anticoagulation    on Xarelto - started 11/12/11  . Depression 11-05-11   On Cymbalta  . GERD (gastroesophageal reflux disease)   . Hyperlipidemia   . Hypertension   . Normal nuclear stress test 2011  . Persistent atrial fibrillation (HCC)    recent new occurrence 3/13>>Tikosyn  . PVC's (premature ventricular contractions)   . Right ventricular enlargement    Mild noted echo 3/13  . Sick sinus syndrome ([place]) 11/23/2012   Dr Caryl Comes  . Tobacco abuse     Past  Surgical History:  Procedure Laterality Date  . ABLATION OF DYSRHYTHMIC FOCUS  07/07/2018  . ATRIAL FIBRILLATION ABLATION N/A 07/07/2018   Procedure: ATRIAL FIBRILLATION ABLATION;  Surgeon: [name], MD;  Location: [facility];  Service: Cardiovascular;  Laterality: N/A;  . CARDIAC CATHETERIZATION  11/1988, 12/07/2004   showing essentially normal left  ventricular function, moderate two-vessel disease (50% LAD after the third diagonal and a 60+% stenosis in the second obtuse marginal)         . CARDIOVERSION N/A 12/05/2011   Procedure: CARDIOVERSION;  Surgeon: Deboraha Sprang, MD;  Location: [facility] CATH LAB;  Service: Cardiovascular;  Laterality: N/A;  . CARDIOVERSION N/A 08/07/2018   Procedure: CARDIOVERSION;  Surgeon: Lelon Perla, MD;  Location: [facility] ENDOSCOPY;  Service: Cardiovascular;  Laterality: N/A;  . CARDIOVERSION N/A 10/09/2018   Procedure: CARDIOVERSION;  Surgeon: Pixie Casino, MD;  Location: [facility] ENDOSCOPY;  Service: Cardiovascular;  Laterality: N/A;  . Blunt   left-retained hardware  . HERNIA REPAIR    . INGUINAL HERNIA REPAIR  01/28/2008   Recurrent right inguinal hernia Laparoscopic, preperitoneal repair of recurrent right inguinal hernia with mesh -- SURGEON:  Dr. Fanny Skates.  . INGUINAL HERNIA REPAIR  11/06/2011   Procedure: HERNIA REPAIR INGUINAL ADULT;  Surgeon: Adin Hector, MD;  Location: [facility];  Service: General;  Laterality: Left;  . PACEMAKER INSERTION  11/23/2012   Dr Caryl Comes  . PERMANENT PACEMAKER INSERTION N/A 11/23/2012   Procedure: PERMANENT PACEMAKER INSERTION;  Surgeon: Deboraha Sprang, MD;  Location: [facility] CATH LAB;  Service: Cardiovascular;  Laterality: N/A;  . TEE WITH CARDIOVERSION  2001, 2006   Invasive electrophysiology study, electroanatomical mapping,and radiofrequency catheter ablation. -- patient's arrhythmia was isthmus dependent flutter not withstanding the very long cycle length.  RF energy delivered across the cavotricuspid  isthmus successfully interrupted conduction across the isthmus and eliminated the patient's sub-straight. PHYSICIAN:  Deboraha Sprang, M.D  . TEE WITHOUT CARDIOVERSION  12/31/2011   Procedure: TRANSESOPHAGEAL ECHOCARDIOGRAM (TEE);  Surgeon: Josue Hector, MD;  Location: [facility] ENDOSCOPY;  Service: Cardiovascular;  Laterality: N/A;  tba after for tikosyn  . TEE WITHOUT CARDIOVERSION N/A 07/07/2018   Procedure: TRANSESOPHAGEAL ECHOCARDIOGRAM (TEE);  Surgeon: Acie Fredrickson Wonda Cheng, MD;  Location: [facility] ENDOSCOPY;  Service: Cardiovascular;  Laterality: N/A;  . VASECTOMY      Current Outpatient Medications  Medication Sig Dispense Refill  . cetirizine (ZYRTEC) 10 MG tablet Take 10 mg by mouth daily.    . diphenhydrAMINE (BENADRYL) 25 MG tablet Take 50 mg by mouth at bedtime.    . fluticasone (FLONASE) 50 MCG/ACT nasal spray Place 1-2 sprays into both nostrils at bedtime.     . furosemide (LASIX) 40 MG tablet TAKE 3 TABLETS DAILY 270 tablet 3  . metolazone (ZAROXOLYN) 2.5 MG tablet Take 1 tablet (2.5 mg total) by mouth as needed (edema). 90 tablet 2  . OVER THE COUNTER MEDICATION Take 1 tablet by mouth daily.    [name] OVER THE COUNTER MEDICATION Take 1 Scoop by mouth daily.    [name] VIAGRA 100 MG tablet Take 100 mg by mouth as needed for erectile dysfunction.     Alveda Reasons 20 MG TABS tablet TAKE 1 TABLET DAILY WITH SUPPER 90 tablet 1   No current facility-administered medications for this visit.    Allergies  Allergen Reactions  . Horse-Derived Products Hives    Review of Systems negative except from HPI and PMH  Physical Exam BP 132/80   Pulse 60   Ht 5\' 11"  (1.803 m)   Wt 201 lb (91.2 kg)   SpO2 99%   BMI 28.03 kg/m  Well developed and nourished in no acute distress HENT normal Neck supple with JVP-flat Carotids brisk and full without bruits Clear Irregularly irregular rate and rhythm with controlled ventricular response, no murmurs or gallops Abd-soft with active BS without hepatomegaly No  Clubbing cyanosis trace edema Skin-warm and dry A & Oriented  Grossly normal sensory and motor function    ECG demonstrates afib Vpacing 60 with some intrinsic conduction   Assessment and  Plan  Coronary artery disease-cath 2006 moderate nonobstructive  Atrial  Fibrillation-permanent  Pacemaker  BSx  Hypertension  HFpEF   Atrial fib flutter is now permanent. with 45% Vpacing   Will decrease LRL to 50 to decrease Vp burden  Dyspnea is likely multifactorial; with diastolic dysfunction, atrial fibrillation and possibly progression of his coronary disease contributing.  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  Lengthy discussion.  We will undertake an echocardiogram and CTA.  Continue his current diuretic regime.  Is here with his fiance.

## 2020-02-18 ENCOUNTER — Other Ambulatory Visit: Payer: Self-pay

## 2020-02-18 ENCOUNTER — Ambulatory Visit (INDEPENDENT_AMBULATORY_CARE_PROVIDER_SITE_OTHER): Payer: Medicare Other | Admitting: Internal Medicine

## 2020-02-18 ENCOUNTER — Encounter: Payer: Self-pay | Admitting: Internal Medicine

## 2020-02-18 VITALS — BP 132/80 | HR 60 | Ht 71.0 in | Wt 201.0 lb

## 2020-02-18 DIAGNOSIS — I5030 Unspecified diastolic (congestive) heart failure: Secondary | ICD-10-CM

## 2020-02-18 DIAGNOSIS — R0600 Dyspnea, unspecified: Secondary | ICD-10-CM

## 2020-02-18 DIAGNOSIS — Z01812 Encounter for preprocedural laboratory examination: Secondary | ICD-10-CM

## 2020-02-18 DIAGNOSIS — I495 Sick sinus syndrome: Secondary | ICD-10-CM | POA: Diagnosis not present

## 2020-02-18 DIAGNOSIS — Z950 Presence of cardiac pacemaker: Secondary | ICD-10-CM | POA: Diagnosis not present

## 2020-02-18 DIAGNOSIS — I4821 Permanent atrial fibrillation: Secondary | ICD-10-CM | POA: Diagnosis not present

## 2020-02-18 DIAGNOSIS — I4892 Unspecified atrial flutter: Secondary | ICD-10-CM

## 2020-02-18 LAB — CUP PACEART INCLINIC DEVICE CHECK
Brady Statistic RA Percent Paced: 1 % — CL
Brady Statistic RV Percent Paced: 33 %
Date Time Interrogation Session: 20210716000000
Implantable Lead Implant Date: 20140421
Implantable Lead Implant Date: 20140421
Implantable Lead Location: 753859
Implantable Lead Location: 753860
Implantable Lead Model: 5076
Implantable Lead Model: 5076
Implantable Pulse Generator Implant Date: 20140421
Lead Channel Pacing Threshold Amplitude: 1.1 V
Lead Channel Pacing Threshold Pulse Width: 0.4 ms
Lead Channel Sensing Intrinsic Amplitude: 13.8 mV
Lead Channel Sensing Intrinsic Amplitude: 4.1 mV
Lead Channel Setting Pacing Amplitude: 1.6 V
Lead Channel Setting Pacing Amplitude: 2.5 V
Lead Channel Setting Pacing Pulse Width: 0.4 ms
Lead Channel Setting Sensing Sensitivity: 2.5 mV
Pulse Gen Serial Number: 112305

## 2020-02-18 LAB — BASIC METABOLIC PANEL
BUN/Creatinine Ratio: 16 (ref 10–24)
BUN: 16 mg/dL (ref 8–27)
CO2: 30 mmol/L — ABNORMAL HIGH (ref 20–29)
Calcium: 9.3 mg/dL (ref 8.6–10.2)
Chloride: 93 mmol/L — ABNORMAL LOW (ref 96–106)
Creatinine, Ser: 1.01 mg/dL (ref 0.76–1.27)
GFR calc Af Amer: 84 mL/min/{1.73_m2} (ref >59)
GFR calc non Af Amer: 72 mL/min/{1.73_m2} (ref >59)
Glucose: 102 mg/dL — ABNORMAL HIGH (ref 65–99)
Potassium: 3.9 mmol/L (ref 3.5–5.2)
Sodium: 136 mmol/L (ref 134–144)

## 2020-02-18 NOTE — Patient Instructions (Addendum)
Medication Instructions:  Your physician recommends that you continue on your current medications as directed. Please refer to the Current Medication list given to you today.  Labwork: BMET today  Testing/Procedures: Your physician has requested that you have an echocardiogram. Echocardiography is a painless test that uses sound waves to create images of your heart. It provides your doctor with information about the size and shape of your heart and how well your heart's chambers and valves are working. This procedure takes approximately one hour. There are no restrictions for this procedure.  Non-Cardiac CT Angiography (CTA), is a special type of CT scan that uses a computer to produce multi-dimensional views of major blood vessels throughout the body. In CT angiography, a contrast material is injected through an IV to help visualize the blood vessels    Follow-Up: Your physician wants you to follow-up in: 6 months with Dr Caryl Comes. You will receive a reminder letter in the mail two months in advance. If you don't receive a letter, please call our office to schedule the follow-up appointment.  Remote monitoring is used to monitor your Pacemaker of ICD from home. This monitoring reduces the number of office visits required to check your device to one time per year. It allows [place] to keep an eye on the functioning of your device to ensure it is working properly.   Any Other Special Instructions Will Be Listed Below (If Applicable).  If you need a refill on your cardiac medications before your next appointment, please call your pharmacy.

## 2020-02-24 ENCOUNTER — Other Ambulatory Visit: Payer: Self-pay

## 2020-02-24 ENCOUNTER — Ambulatory Visit (HOSPITAL_COMMUNITY)
Admission: RE | Admit: 2020-02-24 | Discharge: 2020-02-24 | Disposition: A | Discharge status: Home / Self Care | Payer: Medicare Other | Source: Ambulatory Visit | Attending: Internal Medicine | Admitting: Internal Medicine

## 2020-02-24 DIAGNOSIS — I700 Atherosclerosis of aorta: Secondary | ICD-10-CM | POA: Diagnosis not present

## 2020-02-24 DIAGNOSIS — I4821 Permanent atrial fibrillation: Secondary | ICD-10-CM | POA: Diagnosis not present

## 2020-02-24 DIAGNOSIS — I495 Sick sinus syndrome: Secondary | ICD-10-CM | POA: Diagnosis not present

## 2020-02-24 DIAGNOSIS — Z950 Presence of cardiac pacemaker: Secondary | ICD-10-CM

## 2020-02-24 DIAGNOSIS — I270 Primary pulmonary hypertension: Secondary | ICD-10-CM | POA: Diagnosis not present

## 2020-02-24 DIAGNOSIS — I5030 Unspecified diastolic (congestive) heart failure: Secondary | ICD-10-CM

## 2020-02-24 DIAGNOSIS — I2510 Atherosclerotic heart disease of native coronary artery without angina pectoris: Secondary | ICD-10-CM | POA: Diagnosis not present

## 2020-02-24 DIAGNOSIS — I6503 Occlusion and stenosis of bilateral vertebral arteries: Secondary | ICD-10-CM | POA: Diagnosis not present

## 2020-02-24 DIAGNOSIS — R0600 Dyspnea, unspecified: Secondary | ICD-10-CM | POA: Diagnosis not present

## 2020-02-24 DIAGNOSIS — I4892 Unspecified atrial flutter: Secondary | ICD-10-CM | POA: Diagnosis not present

## 2020-02-24 MED ORDER — SODIUM CHLORIDE (PF) 0.9 % IJ SOLN
INTRAMUSCULAR | Status: AC
  Filled 2020-02-24: qty 50

## 2020-02-24 MED ORDER — IOHEXOL 350 MG/ML SOLN
100.0000 mL | Freq: Once | INTRAVENOUS | Status: AC | PRN
  Administered 2020-02-24: 100 mL via INTRAVENOUS

## 2020-03-02 DIAGNOSIS — N50811 Right testicular pain: Secondary | ICD-10-CM | POA: Diagnosis not present

## 2020-03-02 DIAGNOSIS — R972 Elevated prostate specific antigen [PSA]: Secondary | ICD-10-CM | POA: Diagnosis not present

## 2020-03-02 DIAGNOSIS — I861 Scrotal varices: Secondary | ICD-10-CM | POA: Diagnosis not present

## 2020-03-08 ENCOUNTER — Other Ambulatory Visit: Payer: Self-pay

## 2020-03-08 ENCOUNTER — Ambulatory Visit (HOSPITAL_COMMUNITY): Payer: Medicare Other | Attending: Cardiovascular Disease

## 2020-03-08 DIAGNOSIS — I4821 Permanent atrial fibrillation: Secondary | ICD-10-CM | POA: Diagnosis not present

## 2020-03-08 LAB — ECHOCARDIOGRAM COMPLETE
Area-P 1/2: 3.27 cm2
S' Lateral: 3.7 cm

## 2020-03-21 ENCOUNTER — Telehealth: Payer: Self-pay

## 2020-03-21 DIAGNOSIS — R0609 Other forms of dyspnea: Secondary | ICD-10-CM

## 2020-03-21 DIAGNOSIS — R9430 Abnormal result of cardiovascular function study, unspecified: Secondary | ICD-10-CM

## 2020-03-21 DIAGNOSIS — R0600 Dyspnea, unspecified: Secondary | ICD-10-CM

## 2020-03-21 DIAGNOSIS — I208 Other forms of angina pectoris: Secondary | ICD-10-CM

## 2020-03-21 DIAGNOSIS — R079 Chest pain, unspecified: Secondary | ICD-10-CM

## 2020-03-21 DIAGNOSIS — I861 Scrotal varices: Secondary | ICD-10-CM | POA: Diagnosis not present

## 2020-03-21 DIAGNOSIS — R9389 Abnormal findings on diagnostic imaging of other specified body structures: Secondary | ICD-10-CM

## 2020-03-21 DIAGNOSIS — I2089 Other forms of angina pectoris: Secondary | ICD-10-CM

## 2020-03-21 NOTE — Telephone Encounter (Signed)
Spoke with pt and advised per Dr Caryl Comes due to CTA being abnormal it is suggested that he have a cardiac CT.  Pt verbalizes understanding and agrees to have testing scheduled.

## 2020-04-04 ENCOUNTER — Other Ambulatory Visit: Payer: Medicare Other | Admitting: *Deleted

## 2020-04-04 ENCOUNTER — Other Ambulatory Visit: Payer: Self-pay

## 2020-04-04 DIAGNOSIS — R0600 Dyspnea, unspecified: Secondary | ICD-10-CM | POA: Diagnosis not present

## 2020-04-04 DIAGNOSIS — R9389 Abnormal findings on diagnostic imaging of other specified body structures: Secondary | ICD-10-CM

## 2020-04-04 DIAGNOSIS — I208 Other forms of angina pectoris: Secondary | ICD-10-CM | POA: Diagnosis not present

## 2020-04-05 ENCOUNTER — Ambulatory Visit (INDEPENDENT_AMBULATORY_CARE_PROVIDER_SITE_OTHER): Payer: Medicare Other | Admitting: *Deleted

## 2020-04-05 ENCOUNTER — Telehealth (HOSPITAL_COMMUNITY): Payer: Self-pay | Admitting: Emergency Medicine

## 2020-04-05 DIAGNOSIS — I495 Sick sinus syndrome: Secondary | ICD-10-CM

## 2020-04-05 LAB — BASIC METABOLIC PANEL
BUN/Creatinine Ratio: 15 (ref 10–24)
BUN: 15 mg/dL (ref 8–27)
CO2: 31 mmol/L — ABNORMAL HIGH (ref 20–29)
Calcium: 9.7 mg/dL (ref 8.6–10.2)
Chloride: 92 mmol/L — ABNORMAL LOW (ref 96–106)
Creatinine, Ser: 0.98 mg/dL (ref 0.76–1.27)
GFR calc Af Amer: 86 mL/min/{1.73_m2} (ref >59)
GFR calc non Af Amer: 75 mL/min/{1.73_m2} (ref >59)
Glucose: 132 mg/dL — ABNORMAL HIGH (ref 65–99)
Potassium: 3.9 mmol/L (ref 3.5–5.2)
Sodium: 138 mmol/L (ref 134–144)

## 2020-04-05 LAB — CUP PACEART REMOTE DEVICE CHECK
Battery Remaining Longevity: 42 mo
Battery Remaining Percentage: 47 %
Brady Statistic RA Percent Paced: 0 %
Brady Statistic RV Percent Paced: 53 %
Date Time Interrogation Session: 20210830065300
Implantable Lead Implant Date: 20140421
Implantable Lead Implant Date: 20140421
Implantable Lead Location: 753859
Implantable Lead Location: 753860
Implantable Lead Model: 5076
Implantable Lead Model: 5076
Implantable Pulse Generator Implant Date: 20140421
Lead Channel Impedance Value: 562 Ohm
Lead Channel Impedance Value: 649 Ohm
Lead Channel Pacing Threshold Amplitude: 0.4 V
Lead Channel Pacing Threshold Amplitude: 0.9 V
Lead Channel Pacing Threshold Pulse Width: 0.4 ms
Lead Channel Pacing Threshold Pulse Width: 0.4 ms
Lead Channel Setting Pacing Amplitude: 1.4 V
Lead Channel Setting Pacing Amplitude: 2.5 V
Lead Channel Setting Pacing Pulse Width: 0.4 ms
Lead Channel Setting Sensing Sensitivity: 2.5 mV
Pulse Gen Serial Number: 112305

## 2020-04-05 NOTE — Telephone Encounter (Signed)
Attempted to call patient regarding upcoming cardiac CT appointment. °Left message on voicemail with name and callback number °Kaiyan Luczak RN Navigator Cardiac Imaging °[facility] °[phone] Office °[phone] Cell ° °

## 2020-04-05 NOTE — Telephone Encounter (Signed)
Pt returning phone call regarding upcoming cardiac imaging study; pt verbalizes understanding of appt date/time, parking situation and where to check in, pre-test NPO status and medications ordered, and verified current allergies; name and call back number provided for further questions should they arise Wanita Derenzo RN Navigator Cardiac Imaging [facility] [phone] office [phone] cell   

## 2020-04-07 ENCOUNTER — Ambulatory Visit (HOSPITAL_COMMUNITY)
Admission: RE | Admit: 2020-04-07 | Discharge: 2020-04-07 | Disposition: A | Discharge status: Home / Self Care | Payer: Medicare Other | Source: Ambulatory Visit | Attending: Internal Medicine | Admitting: Internal Medicine

## 2020-04-07 ENCOUNTER — Encounter (HOSPITAL_COMMUNITY): Payer: Self-pay

## 2020-04-07 ENCOUNTER — Other Ambulatory Visit: Payer: Self-pay

## 2020-04-07 DIAGNOSIS — I208 Other forms of angina pectoris: Secondary | ICD-10-CM

## 2020-04-07 NOTE — Progress Notes (Signed)
Remote pacemaker transmission.   

## 2020-04-26 DIAGNOSIS — R972 Elevated prostate specific antigen [PSA]: Secondary | ICD-10-CM | POA: Diagnosis not present

## 2020-05-01 DIAGNOSIS — N401 Enlarged prostate with lower urinary tract symptoms: Secondary | ICD-10-CM | POA: Diagnosis not present

## 2020-05-01 DIAGNOSIS — R972 Elevated prostate specific antigen [PSA]: Secondary | ICD-10-CM | POA: Diagnosis not present

## 2020-05-01 DIAGNOSIS — R3912 Poor urinary stream: Secondary | ICD-10-CM | POA: Diagnosis not present

## 2020-05-01 DIAGNOSIS — N50811 Right testicular pain: Secondary | ICD-10-CM | POA: Diagnosis not present

## 2020-05-16 ENCOUNTER — Ambulatory Visit: Payer: Medicare Other | Attending: Critical Care Medicine

## 2020-05-16 DIAGNOSIS — Z23 Encounter for immunization: Secondary | ICD-10-CM

## 2020-05-16 NOTE — Progress Notes (Signed)
   Covid-19 Vaccination Clinic  Name:  [name]    MRN: [id number] DOB: 1945/09/18  05/16/2020  [name] was observed post Covid-19 immunization for 15 minutes without incident. He was provided with Vaccine Information Sheet and instruction to access the V-Safe system.   [name] was instructed to call 911 with any severe reactions post vaccine: [name] Difficulty breathing  . Swelling of face and throat  . A fast heartbeat  . A bad rash all over body  . Dizziness and weakness

## 2020-05-23 DIAGNOSIS — L570 Actinic keratosis: Secondary | ICD-10-CM | POA: Diagnosis not present

## 2020-05-23 DIAGNOSIS — L821 Other seborrheic keratosis: Secondary | ICD-10-CM | POA: Diagnosis not present

## 2020-05-23 DIAGNOSIS — D485 Neoplasm of uncertain behavior of skin: Secondary | ICD-10-CM | POA: Diagnosis not present

## 2020-05-23 DIAGNOSIS — L820 Inflamed seborrheic keratosis: Secondary | ICD-10-CM | POA: Diagnosis not present

## 2020-07-05 ENCOUNTER — Ambulatory Visit (INDEPENDENT_AMBULATORY_CARE_PROVIDER_SITE_OTHER): Payer: Medicare Other

## 2020-07-05 DIAGNOSIS — Z950 Presence of cardiac pacemaker: Secondary | ICD-10-CM | POA: Diagnosis not present

## 2020-07-06 LAB — CUP PACEART REMOTE DEVICE CHECK
Battery Remaining Longevity: 42 mo
Battery Remaining Percentage: 43 %
Brady Statistic RA Percent Paced: 0 %
Brady Statistic RV Percent Paced: 50 %
Date Time Interrogation Session: 20211201073500
Implantable Lead Implant Date: 20140421
Implantable Lead Implant Date: 20140421
Implantable Lead Location: 753859
Implantable Lead Location: 753860
Implantable Lead Model: 5076
Implantable Lead Model: 5076
Implantable Pulse Generator Implant Date: 20140421
Lead Channel Impedance Value: 623 Ohm
Lead Channel Impedance Value: 718 Ohm
Lead Channel Pacing Threshold Amplitude: 0.4 V
Lead Channel Pacing Threshold Amplitude: 0.9 V
Lead Channel Pacing Threshold Pulse Width: 0.4 ms
Lead Channel Pacing Threshold Pulse Width: 0.4 ms
Lead Channel Setting Pacing Amplitude: 1.4 V
Lead Channel Setting Pacing Amplitude: 2.5 V
Lead Channel Setting Pacing Pulse Width: 0.4 ms
Lead Channel Setting Sensing Sensitivity: 2.5 mV
Pulse Gen Serial Number: 112305

## 2020-07-07 ENCOUNTER — Other Ambulatory Visit: Payer: Self-pay | Admitting: Internal Medicine

## 2020-07-07 NOTE — Telephone Encounter (Signed)
Prescription refill request for Xarelto received.  Indication: Afib Last office visit: Caryl Comes 02/18/2020 Weight: 91.2 kg  Age: 74 Scr: 0.89, 08/18/2019 CrCl: 74ml/min   Prescription refill sent.

## 2020-07-13 NOTE — Progress Notes (Signed)
Remote pacemaker transmission.   

## 2020-08-17 DIAGNOSIS — C44622 Squamous cell carcinoma of skin of right upper limb, including shoulder: Secondary | ICD-10-CM | POA: Diagnosis not present

## 2020-08-17 DIAGNOSIS — D2371 Other benign neoplasm of skin of right lower limb, including hip: Secondary | ICD-10-CM | POA: Diagnosis not present

## 2020-08-17 DIAGNOSIS — C44319 Basal cell carcinoma of skin of other parts of face: Secondary | ICD-10-CM | POA: Diagnosis not present

## 2020-09-11 DIAGNOSIS — C44622 Squamous cell carcinoma of skin of right upper limb, including shoulder: Secondary | ICD-10-CM | POA: Diagnosis not present

## 2020-09-25 DIAGNOSIS — C44319 Basal cell carcinoma of skin of other parts of face: Secondary | ICD-10-CM | POA: Diagnosis not present

## 2020-09-26 ENCOUNTER — Other Ambulatory Visit: Payer: Self-pay

## 2020-09-26 ENCOUNTER — Telehealth: Payer: Self-pay

## 2020-09-26 ENCOUNTER — Telehealth (INDEPENDENT_AMBULATORY_CARE_PROVIDER_SITE_OTHER): Payer: Medicare Other | Admitting: Internal Medicine

## 2020-09-26 VITALS — BP 120/76 | HR 60 | Ht 71.5 in | Wt 200.0 lb

## 2020-09-26 DIAGNOSIS — R0600 Dyspnea, unspecified: Secondary | ICD-10-CM | POA: Diagnosis not present

## 2020-09-26 DIAGNOSIS — Z950 Presence of cardiac pacemaker: Secondary | ICD-10-CM

## 2020-09-26 DIAGNOSIS — I4821 Permanent atrial fibrillation: Secondary | ICD-10-CM | POA: Diagnosis not present

## 2020-09-26 DIAGNOSIS — I5032 Chronic diastolic (congestive) heart failure: Secondary | ICD-10-CM

## 2020-09-26 NOTE — Progress Notes (Signed)
Electrophysiology TeleHealth Note   Due to national recommendations of social distancing due to COVID 19, an audio/video telehealth visit is felt to be most appropriate for this patient at this time.  See MyChart message from today for the patient's consent to telehealth for [facility].   Date:  09/26/2020   ID:  [name], DOB 08-14-1945, MRN [id number]  Location: patient's home  Provider location: [address], [place]  Evaluation Performed: Follow-up visit  PCP:  [name], MD  Cardiologist:     Electrophysiologist:  SK   Chief Complaint:  Atrial fib  History of Present Illness:    [name] is a 75 y.o. male who presents via audio/video conferencing for a telehealth visit today.  Since last being seen in our clinic for persistent atrial fibrillation, the patient reports consultation with Dr Greggory Brandy which was followed by AFib ablation (12/19) and DCCV 3/20  Failed to hold sinus rhythm and his dofetilide was stopped Discussions outlined in last note JA, amio, hybrid ablation but did not feel like endovascular approach would be fruitful with significant R atriopathy, noted on TEE although not described on TTE  Continues to complain of dyspnea and recalls comments from exit interviewe with [employer]  More edema, using metolazone now weekly--but no change in dyspnea although less edema   Dietary salt deplete; not much prepared food.  Fluid intake -- deplete   Alcohol intake >6 ounces bourbon and 1/2 bottle wine       Date Cr K Hgb  2/17  1.08 3.7   8/17  0.93 4.1   9/18 0.91 4.9 16.0  3/20 0.81 3.4 13.9  6/20 1.0 4.3 16.6    DATE TEST    4/13    Echo   EF 55-65 %   3/14    Myoview   Images Personally reviewed  Normal   11/19 Echo  LAE (45/2.05/26)  12/19 TEE 60-65% Severe R Atriopathy  8/21 Echo 55-60%   7/21 CTA  CA calcifications But not gated study ordered by mistake     The patient denies symptoms of fevers, chills,  cough, or new SOB worrisome for COVID 19.    Past Medical History:  Diagnosis Date   1st degree AV block    Atrial flutter ([place])    s/p ablation 2006   CAD (coronary artery disease)    Mild 2 vessel disease per cath in 2006; myoview neg 2011   Chronic anticoagulation    on Xarelto - started 11/12/11   Depression 11-05-11   On Cymbalta   GERD (gastroesophageal reflux disease)    Hyperlipidemia    Hypertension    Normal nuclear stress test 2011   Persistent atrial fibrillation ([place])    recent new occurrence 3/13>>Tikosyn   PVC's (premature ventricular contractions)    Right ventricular enlargement    Mild noted echo 3/13   Sick sinus syndrome ([place]) 11/23/2012   Dr Caryl Comes   Tobacco abuse     Past Surgical History:  Procedure Laterality Date   ABLATION OF DYSRHYTHMIC FOCUS  07/07/2018   ATRIAL FIBRILLATION ABLATION N/A 07/07/2018   Procedure: ATRIAL FIBRILLATION ABLATION;  Surgeon: [name], MD;  Location: [facility];  Service: Cardiovascular;  Laterality: N/A;   CARDIAC CATHETERIZATION  11/1988, 12/07/2004   showing essentially normal left  ventricular function, moderate two-vessel disease (50% LAD after the third diagonal and a 60+% stenosis in the second obtuse marginal)  CARDIOVERSION N/A 12/05/2011   Procedure: CARDIOVERSION;  Surgeon: Deboraha Sprang, MD;  Location: [facility] CATH LAB;  Service: Cardiovascular;  Laterality: N/A;   CARDIOVERSION N/A 08/07/2018   Procedure: CARDIOVERSION;  Surgeon: Lelon Perla, MD;  Location: [facility] ENDOSCOPY;  Service: Cardiovascular;  Laterality: N/A;   CARDIOVERSION N/A 10/09/2018   Procedure: CARDIOVERSION;  Surgeon: Pixie Casino, MD;  Location: [facility];  Service: Cardiovascular;  Laterality: N/A;   [place]   left-retained hardware   [place]  01/28/2008   Recurrent right inguinal hernia Laparoscopic, preperitoneal repair of recurrent right inguinal hernia  with mesh -- SURGEON:  Dr. Fanny Skates.   INGUINAL HERNIA REPAIR  11/06/2011   Procedure: HERNIA REPAIR INGUINAL ADULT;  Surgeon: Adin Hector, MD;  Location: [facility]S;  Service: General;  Laterality: Left;   PACEMAKER INSERTION  11/23/2012   Dr Caryl Comes   PERMANENT PACEMAKER INSERTION N/A 11/23/2012   Procedure: PERMANENT PACEMAKER INSERTION;  Surgeon: Deboraha Sprang, MD;  Location: [facility] CATH LAB;  Service: Cardiovascular;  Laterality: N/A;   TEE WITH CARDIOVERSION  2001, 2006   Invasive electrophysiology study, electroanatomical mapping,and radiofrequency catheter ablation. -- patient's arrhythmia was isthmus dependent flutter not withstanding the very long cycle length.  RF energy delivered across the cavotricuspid isthmus successfully interrupted conduction across the isthmus and eliminated the patient's sub-straight. PHYSICIAN:  Deboraha Sprang, M.D   TEE WITHOUT CARDIOVERSION  12/31/2011   Procedure: TRANSESOPHAGEAL ECHOCARDIOGRAM (TEE);  Surgeon: Josue Hector, MD;  Location: [facility];  Service: Cardiovascular;  Laterality: N/A;  tba after for tikosyn   TEE WITHOUT CARDIOVERSION N/A 07/07/2018   Procedure: TRANSESOPHAGEAL ECHOCARDIOGRAM (TEE);  Surgeon: Acie Fredrickson Wonda Cheng, MD;  Location: [facility] ENDOSCOPY;  Service: Cardiovascular;  Laterality: N/A;   VASECTOMY      Current Outpatient Medications  Medication Sig Dispense Refill   cetirizine (ZYRTEC) 10 MG tablet Take 10 mg by mouth daily.     diphenhydrAMINE (BENADRYL) 25 MG tablet Take 50 mg by mouth at bedtime.     fluticasone (FLONASE) 50 MCG/ACT nasal spray Place 1-2 sprays into both nostrils at bedtime.      furosemide (LASIX) 40 MG tablet TAKE 3 TABLETS DAILY 270 tablet 3   metolazone (ZAROXOLYN) 2.5 MG tablet Take 1 tablet (2.5 mg total) by mouth as needed (edema). 90 tablet 2   OVER THE COUNTER MEDICATION Take 1 tablet by mouth daily.     OVER THE COUNTER MEDICATION Take 1 Scoop by mouth daily.     VIAGRA 100 MG tablet  Take 100 mg by mouth as needed for erectile dysfunction.      XARELTO 20 MG TABS tablet TAKE 1 TABLET DAILY WITH SUPPER 90 tablet 1   No current facility-administered medications for this visit.    Allergies:   Horse-derived products   Social History:  The patient  reports that he quit smoking about 54 years ago. His smoking use included cigarettes. He quit after 4.00 years of use. He has never used smokeless tobacco. He reports current alcohol use of about 2.0 standard drinks of alcohol per week. He reports that he does not use drugs.   Family History:  The patient's   family history includes Atrial fibrillation in his mother; Cancer in his father, paternal uncle, and sister; Coronary artery disease in his father; Heart attack (age of onset: 25) in his father; Heart failure (age of onset: 16) in his mother; Kidney disease  in his father.   ROS:  Please see the history of present illness.   All other systems are personally reviewed and negative.    Exam:    Vital Signs:  BP 120/76    Pulse 60    Ht 5' 11.5" (1.816 m)    Wt 200 lb (90.7 kg)    BMI 27.51 kg/m  *   Well appearing, without overt dyspnea, skin color good     Labs/Other Tests and Data Reviewed:    Recent Labs: 04/04/2020: BUN 15; Creatinine, Ser 0.98; Potassium 3.9; Sodium 138   Wt Readings from Last 3 Encounters:  09/26/20 200 lb (90.7 kg)  02/18/20 201 lb (91.2 kg)  08/12/19 193 lb (87.5 kg)     Other studies personally reviewed: Additional studies/ records that were reviewed today include: As above   Last device remote is reviewed from [facility] PDF dated 6/20 which reveals normal device function,   arrhythmias - atrail fibrillation long term persistent Vp 43<<33 %   ASSESSMENT & PLAN:    Sinus node dysfunction  Atrial Fibrillation now permanent  Pacemaker  BSx  Hypertension  Deconditioning  HFpEF  Depression  EtOH intake exuberant   Dyspnea persists despite diuretics and   CT was not able  to be done for some ( unspecified ) reason and hx of modest obstruction  Will pursue with myoview scanning  But also with prior hx of lung comments will arrange for pulmon consultation for dyspnea  ? CPX    Continue diuretics as current prescribed and use of prn   Diet is salt and water deplete  ETOH intake is exuberant but not sure how contributing  LV function is normal   RA enlargement suggests some degree of Pul HTN or L.R shunting   On Anticoagulation;  No bleeding issues   BP well controlled     COVID 19 screen The patient denies symptoms of COVID 19 at this time.  The importance of social distancing was discussed today.  Follow-up:  *39m    Current medicines are reviewed at length with the patient today.   The patient does not have concerns regarding his medicines.  The following changes were made today:  none  Labs/ tests ordered today include:   Pulm evaluation for dyspnea  No orders of the defined types were placed in this encounter.     Patient Risk:  after full review of this patients clinical status, I feel that they are at moderate * risk at this time.  Today, I have spent 18  minutes with the patient with telehealth technology discussing the above.     Signed, Virl Axe, MD  09/26/2020 2:33 PM     [place] [address] [place] [address] [phone] (office) [phone] (fax)

## 2020-09-26 NOTE — Patient Instructions (Signed)
Medication Instructions:  Your physician recommends that you continue on your current medications as directed. Please refer to the Current Medication list given to you today.  *If you need a refill on your cardiac medications before your next appointment, please call your pharmacy*   Lab Work: None ordered.  If you have labs (blood work) drawn today and your tests are completely normal, you will receive your results only by: [name] MyChart Message (if you have MyChart) OR . A paper copy in the mail If you have any lab test that is abnormal or we need to change your treatment, we will call you to review the results.   Testing/Procedures: Your doctor recommends a referral to Pulmonary specialist for further evaluation of your shortness of breath.  A referral has been submitted and you will be contacted to schedule an appointment.   Follow-Up: At [facility], you and your health needs are our priority.  As part of our continuing mission to provide you with exceptional heart care, we have created designated Provider Care Teams.  These Care Teams include your primary Cardiologist (physician) and Advanced Practice Providers (APPs -  Physician Assistants and Nurse Practitioners) who all work together to provide you with the care you need, when you need it.  We recommend signing up for the patient portal called "MyChart".  Sign up information is provided on this After Visit Summary.  MyChart is used to connect with patients for Virtual Visits (Telemedicine).  Patients are able to view lab/test results, encounter notes, upcoming appointments, etc.  Non-urgent messages can be sent to your provider as well.   To learn more about what you can do with MyChart, go to [email or web address].    Your next appointment:   12 month(s)  The format for your next appointment:   In Person  Provider:   Virl Axe, MD

## 2020-09-26 NOTE — Telephone Encounter (Signed)
  Patient Consent for Virtual Visit         [name] has provided verbal consent on 09/26/2020 for a virtual visit (video or telephone).   CONSENT FOR VIRTUAL VISIT FOR:  [name]  By participating in this virtual visit I agree to the following:  I hereby voluntarily request, consent and authorize [facility] and its employed or contracted physicians, physician assistants, nurse practitioners or other licensed health care professionals (the Practitioner), to provide me with telemedicine health care services (the "Services") as deemed necessary by the treating Practitioner. I acknowledge and consent to receive the Services by the Practitioner via telemedicine. I understand that the telemedicine visit will involve communicating with the Practitioner through live audiovisual communication technology and the disclosure of certain medical information by electronic transmission. I acknowledge that I have been given the opportunity to request an in-person assessment or other available alternative prior to the telemedicine visit and am voluntarily participating in the telemedicine visit.  I understand that I have the right to withhold or withdraw my consent to the use of telemedicine in the course of my care at any time, without affecting my right to future care or treatment, and that the Practitioner or I may terminate the telemedicine visit at any time. I understand that I have the right to inspect all information obtained and/or recorded in the course of the telemedicine visit and may receive copies of available information for a reasonable fee.  I understand that some of the potential risks of receiving the Services via telemedicine include:  [name] Delay or interruption in medical evaluation due to technological equipment failure or disruption; . Information transmitted may not be sufficient (e.g. poor resolution of images) to allow for appropriate medical decision making by the Practitioner;  and/or  . In rare instances, security protocols could fail, causing a breach of personal health information.  Furthermore, I acknowledge that it is my responsibility to provide information about my medical history, conditions and care that is complete and accurate to the best of my ability. I acknowledge that Practitioner's advice, recommendations, and/or decision may be based on factors not within their control, such as incomplete or inaccurate data provided by me or distortions of diagnostic images or specimens that may result from electronic transmissions. I understand that the practice of medicine is not an exact science and that Practitioner makes no warranties or guarantees regarding treatment outcomes. I acknowledge that a copy of this consent can be made available to me via my patient portal ([facility]), or I can request a printed copy by calling the office of [facility].    I understand that my insurance will be billed for this visit.   I have read or had this consent read to me. . I understand the contents of this consent, which adequately explains the benefits and risks of the Services being provided via telemedicine.  . I have been provided ample opportunity to ask questions regarding this consent and the Services and have had my questions answered to my satisfaction. . I give my informed consent for the services to be provided through the use of telemedicine in my medical care

## 2020-10-04 ENCOUNTER — Ambulatory Visit (INDEPENDENT_AMBULATORY_CARE_PROVIDER_SITE_OTHER): Payer: Medicare Other

## 2020-10-04 DIAGNOSIS — I440 Atrioventricular block, first degree: Secondary | ICD-10-CM | POA: Diagnosis not present

## 2020-10-06 LAB — CUP PACEART REMOTE DEVICE CHECK
Battery Remaining Longevity: 36 mo
Battery Remaining Percentage: 40 %
Brady Statistic RA Percent Paced: 0 %
Brady Statistic RV Percent Paced: 51 %
Date Time Interrogation Session: 20220302213400
Implantable Lead Implant Date: 20140421
Implantable Lead Implant Date: 20140421
Implantable Lead Location: 753859
Implantable Lead Location: 753860
Implantable Lead Model: 5076
Implantable Lead Model: 5076
Implantable Pulse Generator Implant Date: 20140421
Lead Channel Impedance Value: 580 Ohm
Lead Channel Impedance Value: 687 Ohm
Lead Channel Pacing Threshold Amplitude: 0.4 V
Lead Channel Pacing Threshold Amplitude: 0.9 V
Lead Channel Pacing Threshold Pulse Width: 0.4 ms
Lead Channel Pacing Threshold Pulse Width: 0.4 ms
Lead Channel Setting Pacing Amplitude: 1.3 V
Lead Channel Setting Pacing Amplitude: 2.5 V
Lead Channel Setting Pacing Pulse Width: 0.4 ms
Lead Channel Setting Sensing Sensitivity: 2.5 mV
Pulse Gen Serial Number: 112305

## 2020-10-12 NOTE — Progress Notes (Signed)
Remote pacemaker transmission.   

## 2020-11-06 DIAGNOSIS — R739 Hyperglycemia, unspecified: Secondary | ICD-10-CM | POA: Diagnosis not present

## 2020-11-06 DIAGNOSIS — I4891 Unspecified atrial fibrillation: Secondary | ICD-10-CM | POA: Diagnosis not present

## 2020-11-06 DIAGNOSIS — R7309 Other abnormal glucose: Secondary | ICD-10-CM | POA: Diagnosis not present

## 2020-11-06 DIAGNOSIS — Z0000 Encounter for general adult medical examination without abnormal findings: Secondary | ICD-10-CM | POA: Diagnosis not present

## 2020-11-06 DIAGNOSIS — M720 Palmar fascial fibromatosis [Dupuytren]: Secondary | ICD-10-CM | POA: Diagnosis not present

## 2020-11-06 DIAGNOSIS — R030 Elevated blood-pressure reading, without diagnosis of hypertension: Secondary | ICD-10-CM | POA: Diagnosis not present

## 2020-11-06 DIAGNOSIS — J309 Allergic rhinitis, unspecified: Secondary | ICD-10-CM | POA: Diagnosis not present

## 2020-11-06 DIAGNOSIS — E7800 Pure hypercholesterolemia, unspecified: Secondary | ICD-10-CM | POA: Diagnosis not present

## 2020-11-06 DIAGNOSIS — Z136 Encounter for screening for cardiovascular disorders: Secondary | ICD-10-CM | POA: Diagnosis not present

## 2020-11-06 DIAGNOSIS — L111 Transient acantholytic dermatosis [Grover]: Secondary | ICD-10-CM | POA: Diagnosis not present

## 2020-11-06 DIAGNOSIS — N529 Male erectile dysfunction, unspecified: Secondary | ICD-10-CM | POA: Diagnosis not present

## 2020-11-30 ENCOUNTER — Other Ambulatory Visit: Payer: Self-pay

## 2020-11-30 ENCOUNTER — Ambulatory Visit: Payer: Medicare Other | Attending: Internal Medicine

## 2020-11-30 ENCOUNTER — Other Ambulatory Visit (HOSPITAL_BASED_OUTPATIENT_CLINIC_OR_DEPARTMENT_OTHER): Payer: Self-pay

## 2020-11-30 ENCOUNTER — Other Ambulatory Visit (HOSPITAL_COMMUNITY): Payer: Self-pay

## 2020-11-30 DIAGNOSIS — Z23 Encounter for immunization: Secondary | ICD-10-CM

## 2020-11-30 MED ORDER — PFIZER-BIONT COVID-19 VAC-TRIS 30 MCG/0.3ML IM SUSP
INTRAMUSCULAR | 0 refills | Status: DC
  Filled 2020-11-30: qty 0.3, 1d supply, fill #0

## 2020-11-30 NOTE — Progress Notes (Signed)
   Covid-19 Vaccination Clinic  Name:  HAITHAM DOLINSKY    MRN: [id number] DOB: [birth year]  11/30/2020  Mr. Gatling was observed post Covid-19 immunization for 15 minutes without incident. He was provided with Vaccine Information Sheet and instruction to access the V-Safe system.   Mr. Mehra was instructed to call 911 with any severe reactions post vaccine: [name] Difficulty breathing  . Swelling of face and throat  . A fast heartbeat  . A bad rash all over body  . Dizziness and weakness   Immunizations Administered    Name Date Dose VIS Date Route   PFIZER Comrnaty(Gray TOP) Covid-19 Vaccine 11/30/2020 11:02 AM 0.3 mL 07/13/2020 Intramuscular   Manufacturer: [employer]   Lot: [id number]   [place]: [phone]

## 2020-12-18 ENCOUNTER — Ambulatory Visit (INDEPENDENT_AMBULATORY_CARE_PROVIDER_SITE_OTHER): Payer: Medicare Other | Admitting: Pulmonary Disease

## 2020-12-18 ENCOUNTER — Other Ambulatory Visit: Payer: Self-pay

## 2020-12-18 ENCOUNTER — Encounter: Payer: Self-pay | Admitting: Pulmonary Disease

## 2020-12-18 VITALS — BP 130/70 | HR 64 | Ht 71.5 in | Wt 208.0 lb

## 2020-12-18 DIAGNOSIS — R0600 Dyspnea, unspecified: Secondary | ICD-10-CM | POA: Diagnosis not present

## 2020-12-18 DIAGNOSIS — M720 Palmar fascial fibromatosis [Dupuytren]: Secondary | ICD-10-CM | POA: Insufficient documentation

## 2020-12-18 DIAGNOSIS — I272 Pulmonary hypertension, unspecified: Secondary | ICD-10-CM | POA: Diagnosis not present

## 2020-12-18 DIAGNOSIS — R6882 Decreased libido: Secondary | ICD-10-CM | POA: Insufficient documentation

## 2020-12-18 DIAGNOSIS — J309 Allergic rhinitis, unspecified: Secondary | ICD-10-CM | POA: Insufficient documentation

## 2020-12-18 DIAGNOSIS — R7303 Prediabetes: Secondary | ICD-10-CM | POA: Insufficient documentation

## 2020-12-18 DIAGNOSIS — M546 Pain in thoracic spine: Secondary | ICD-10-CM | POA: Insufficient documentation

## 2020-12-18 DIAGNOSIS — Z9889 Other specified postprocedural states: Secondary | ICD-10-CM | POA: Diagnosis not present

## 2020-12-18 DIAGNOSIS — R1011 Right upper quadrant pain: Secondary | ICD-10-CM | POA: Insufficient documentation

## 2020-12-18 DIAGNOSIS — R739 Hyperglycemia, unspecified: Secondary | ICD-10-CM | POA: Insufficient documentation

## 2020-12-18 DIAGNOSIS — Z8679 Personal history of other diseases of the circulatory system: Secondary | ICD-10-CM | POA: Insufficient documentation

## 2020-12-18 DIAGNOSIS — J3089 Other allergic rhinitis: Secondary | ICD-10-CM | POA: Insufficient documentation

## 2020-12-18 DIAGNOSIS — L111 Transient acantholytic dermatosis [Grover]: Secondary | ICD-10-CM | POA: Insufficient documentation

## 2020-12-18 DIAGNOSIS — I519 Heart disease, unspecified: Secondary | ICD-10-CM

## 2020-12-18 DIAGNOSIS — N529 Male erectile dysfunction, unspecified: Secondary | ICD-10-CM | POA: Insufficient documentation

## 2020-12-18 DIAGNOSIS — R0609 Other forms of dyspnea: Secondary | ICD-10-CM

## 2020-12-18 DIAGNOSIS — F324 Major depressive disorder, single episode, in partial remission: Secondary | ICD-10-CM

## 2020-12-18 DIAGNOSIS — F331 Major depressive disorder, recurrent, moderate: Secondary | ICD-10-CM | POA: Insufficient documentation

## 2020-12-18 DIAGNOSIS — R030 Elevated blood-pressure reading, without diagnosis of hypertension: Secondary | ICD-10-CM | POA: Insufficient documentation

## 2020-12-18 DIAGNOSIS — M7710 Lateral epicondylitis, unspecified elbow: Secondary | ICD-10-CM | POA: Insufficient documentation

## 2020-12-18 DIAGNOSIS — E7800 Pure hypercholesterolemia, unspecified: Secondary | ICD-10-CM | POA: Insufficient documentation

## 2020-12-18 HISTORY — DX: Major depressive disorder, single episode, in partial remission: F32.4

## 2020-12-18 NOTE — Progress Notes (Signed)
Synopsis: Referred in DOE for SOB by Elias Elseeade, Robert, MD  Subjective:   PATIENT ID: [name] GENDER: male DOB: April 05, 1946, MRN: [id number]  Chief Complaint  Patient presents with  . Consult    Dyspnea    PMH of perm AF, aflutter, PMP, follows with Dr. Graciela HusbandsKlein, on xarelto, 25 years ago had catheter ablation for flutter, 2014 had stress test and ended up with on-demand pacer. Takes diuretics daily. He quit smoking in 1968 and smoked for 3 years. 20 years in the [employer], worked for [employer], now retired. Did wood working in the past. Was a runner in 20s-40s. His retirement physical from [employer] was told his spirometry wasn't normal. No asthma as a kid.  Patient still has shortness of breath with exertion.  Also has bilateral lower extremity edema.  Takes diuretics daily and metolazone once a week.  CT scan of the chest completed in 2021 which revealed enlarged pulmonary artery, enlarged RA and RV.  Of note had a pulmonary vein ablation for A. fib in 2019.   Past Medical History:  Diagnosis Date  . 1st degree AV block   . Atrial flutter (HCC)    s/p ablation 2006  . CAD (coronary artery disease)    Mild 2 vessel disease per cath in 2006; myoview neg 2011  . Chronic anticoagulation    on Xarelto - started 11/12/11  . Depression 11-05-11   On Cymbalta  . GERD (gastroesophageal reflux disease)   . Hyperlipidemia   . Hypertension   . 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 Normal nuclear stress test 2011  . Persistent atrial fibrillation (HCC)    recent new occurrence 3/13>>Tikosyn  . PVC's (premature ventricular contractions)   . Right ventricular enlargement    Mild noted echo 3/13  . Sick sinus syndrome (HCC) 11/23/2012   Dr Graciela HusbandsKlein  . Tobacco abuse      Family History  Problem Relation Age of Onset  . Heart attack Father [id number]  . Cancer Father        colon  . Kidney disease Father        End Stage  . Coronary artery disease Father        Had  Valve Replacement  . Atrial fibrillation Mother        flutter  . Heart failure Mother [phone]  . Cancer Sister        colon  . Cancer Paternal Uncle        lung     Past Surgical History:  Procedure Laterality Date  . ABLATION OF DYSRHYTHMIC FOCUS  07/07/2018  . ATRIAL FIBRILLATION ABLATION N/A 07/07/2018   Procedure: ATRIAL FIBRILLATION ABLATION;  Surgeon: Hillis RangeAllred, James, MD;  Location: [facility] INVASIVE CV LAB;  Service: Cardiovascular;  Laterality: N/A;  . CARDIAC CATHETERIZATION  11/1988, 12/07/2004   showing essentially normal left  ventricular function, moderate two-vessel disease (50% LAD after the third diagonal and a 60+% stenosis in the second obtuse marginal)         . CARDIOVERSION N/A 12/05/2011   Procedure: CARDIOVERSION;  Surgeon: Duke SalviaSteven C Klein, MD;  [facility];  Service: Cardiovascular;  Laterality: N/A;  . CARDIOVERSION N/A 08/07/2018   Procedure: CARDIOVERSION;  Surgeon: Lewayne Buntingrenshaw, Brian S, MD;  [facility];  Service: Cardiovascular;  Laterality: N/A;  . CARDIOVERSION N/A 10/09/2018   Procedure: CARDIOVERSION;  Surgeon: Chrystie NoseHilty, Kenneth C, MD;  [facility];  Service: Cardiovascular;  Laterality: N/A;  .  [place]   left-retained hardware  . HERNIA REPAIR    . INGUINAL HERNIA REPAIR  01/28/2008   Recurrent right inguinal hernia Laparoscopic, preperitoneal repair of recurrent right inguinal hernia with mesh -- SURGEON:  Dr. Fanny Skates.  . INGUINAL HERNIA REPAIR  11/06/2011   Procedure: HERNIA REPAIR INGUINAL ADULT;  Surgeon: Adin Hector, MD;  Location: [facility] ORS;  Service: General;  Laterality: Left;  . PACEMAKER INSERTION  11/23/2012   Dr Caryl Comes  . PERMANENT PACEMAKER INSERTION N/A 11/23/2012   Procedure: PERMANENT PACEMAKER INSERTION;  Surgeon: Deboraha Sprang, MD;  Location[facility] CATH LAB;  Service: Cardiovascular;  Laterality: N/A;  . TEE WITH CARDIOVERSION  2001, 2006   Invasive electrophysiology study, electroanatomical mapping,and  radiofrequency catheter ablation. -- patient's arrhythmia was isthmus dependent flutter not withstanding the very long cycle length.  RF energy delivered across the cavotricuspid isthmus successfully interrupted conduction across the isthmus and eliminated the patient's sub-straight. PHYSICIAN:  Deboraha Sprang, M.D  . TEE WITHOUT CARDIOVERSION  12/31/2011   Procedure: TRANSESOPHAGEAL ECHOCARDIOGRAM (TEE);  Surgeon: Josue Hector, MD;  Location: [facility] ENDOSCOPY;  Service: Cardiovascular;  Laterality: N/A;  tba after for tikosyn  . TEE WITHOUT CARDIOVERSION N/A 07/07/2018   Procedure: TRANSESOPHAGEAL ECHOCARDIOGRAM (TEE);  Surgeon: Acie Fredrickson Wonda Cheng, MD;  Location: [facility] ENDOSCOPY;  Service: Cardiovascular;  Laterality: N/A;  . VASECTOMY      Social History   Socioeconomic History  . Marital status: Married    Spouse name: Not on file  . Number of children: Not on file  . Years of education: Not on file  . Highest education level: Not on file  Occupational History  . Not on file  Tobacco Use  . Smoking status: Former Smoker    Years: 4.00    Types: Cigarettes    Quit date: 08/05/1966    Years since quitting: 54.4  . Smokeless tobacco: Never Used  Vaping Use  . Vaping Use: Never used  Substance and Sexual Activity  . Alcohol use: Yes    Alcohol/week: 2.0 standard drinks    Types: 2 Shots of liquor per week    Comment: DAILY  . Drug use: No  . Sexual activity: Not Currently  Other Topics Concern  . Not on file  Social History Narrative  . Not on file   Social Determinants of Health   Financial Resource Strain: Not on file  Food Insecurity: Not on file  Transportation Needs: Not on file  Physical Activity: Not on file  Stress: Not on file  Social Connections: Not on file  Intimate Partner Violence: Not on file     Allergies  Allergen Reactions  . Duloxetine Hcl     Other reaction(s): nausea, diarrhea  . Horse-Derived Products Hives  . Other     Other reaction(s): jaw  clenching     Outpatient Medications Prior to Visit  Medication Sig Dispense Refill  . cetirizine (ZYRTEC) 10 MG tablet Take 10 mg by mouth daily.    [name] COVID-19 mRNA Vac-TriS, Pfizer, (PFIZER-BIONT COVID-19 VAC-TRIS) SUSP injection Inject into the muscle. 0.3 mL 0  . diphenhydrAMINE (BENADRYL) 25 MG tablet Take 50 mg by mouth at bedtime.    . fluticasone (FLONASE) 50 MCG/ACT nasal spray Place 1-2 sprays into both nostrils at bedtime.     . furosemide (LASIX) 40 MG tablet TAKE 3 TABLETS DAILY 270 tablet 3  . metolazone (ZAROXOLYN) 2.5 MG tablet Take 1 tablet (2.5 mg total) by mouth as  needed (edema). 90 tablet 2  . OVER THE COUNTER MEDICATION Take 1 tablet by mouth daily.    [name] OVER THE COUNTER MEDICATION Take 1 Scoop by mouth daily.    [name] VIAGRA 100 MG tablet Take 100 mg by mouth as needed for erectile dysfunction.     Alveda Reasons 20 MG TABS tablet TAKE 1 TABLET DAILY WITH SUPPER 90 tablet 1   No facility-administered medications prior to visit.    Review of Systems  Constitutional: Negative for chills, fever, malaise/fatigue and weight loss.  HENT: Negative for hearing loss, sore throat and tinnitus.   Eyes: Negative for blurred vision and double vision.  Respiratory: Positive for shortness of breath. Negative for cough, hemoptysis, sputum production, wheezing and stridor.   Cardiovascular: Positive for orthopnea and leg swelling. Negative for chest pain, palpitations and PND.  Gastrointestinal: Negative for abdominal pain, constipation, diarrhea, heartburn, nausea and vomiting.  Genitourinary: Negative for dysuria, hematuria and urgency.  Musculoskeletal: Negative for joint pain and myalgias.  Skin: Negative for itching and rash.  Neurological: Negative for dizziness, tingling, weakness and headaches.  Endo/Heme/Allergies: Negative for environmental allergies. Does not bruise/bleed easily.  Psychiatric/Behavioral: Negative for depression. The patient is not nervous/anxious and does not  have insomnia.   All other systems reviewed and are negative.    Objective:  Physical Exam Vitals reviewed.  Constitutional:      General: He is not in acute distress.    Appearance: He is well-developed.  HENT:     Head: Normocephalic and atraumatic.  Eyes:     General: No scleral icterus.    Conjunctiva/sclera: Conjunctivae normal.     Pupils: Pupils are equal, round, and reactive to light.  Neck:     Vascular: No JVD.     Trachea: No tracheal deviation.  Cardiovascular:     Rate and Rhythm: Normal rate and regular rhythm.     Heart sounds: Murmur heard.    Pulmonary:     Effort: Pulmonary effort is normal. No tachypnea, accessory muscle usage or respiratory distress.     Breath sounds: Normal breath sounds. No stridor. No wheezing, rhonchi or rales.  Abdominal:     General: Bowel sounds are normal. There is no distension.     Palpations: Abdomen is soft.     Tenderness: There is no abdominal tenderness.  Musculoskeletal:        General: No tenderness.     Cervical back: Neck supple.     Right lower leg: Edema present.     Left lower leg: Edema present.  Lymphadenopathy:     Cervical: No cervical adenopathy.  Skin:    General: Skin is warm and dry.     Capillary Refill: Capillary refill takes less than 2 seconds.     Findings: No rash.  Neurological:     Mental Status: He is alert and oriented to person, place, and time.  Psychiatric:        Behavior: Behavior normal.      Vitals:   12/18/20 1412  BP: 130/70  Pulse: 64  SpO2: 97%  Weight: 208 lb (94.3 kg)  Height: 5' 11.5" (1.816 m)   97% on RA BMI Readings from Last 3 Encounters:  12/18/20 28.61 kg/m  09/26/20 27.51 kg/m  02/18/20 28.03 kg/m   Wt Readings from Last 3 Encounters:  12/18/20 208 lb (94.3 kg)  09/26/20 200 lb (90.7 kg)  02/18/20 201 lb (91.2 kg)     CBC    Component Value  Date/Time   WBC 5.3 08/18/2019 1125   WBC 6.1 08/03/2018 1424   RBC 4.63 08/18/2019 1125   RBC 4.77  08/03/2018 1424   HGB 15.7 08/18/2019 1125   HCT 43.9 08/18/2019 1125   PLT 211 08/18/2019 1125   MCV 95 08/18/2019 1125   MCH 33.9 (H) 08/18/2019 1125   MCH 33.3 08/03/2018 1424   MCHC 35.8 (H) 08/18/2019 1125   MCHC 33.8 08/03/2018 1424   RDW 12.5 08/18/2019 1125   LYMPHSABS 2.4 04/29/2017 1648   MONOABS 0.6 11/19/2012 0952   EOSABS 0.2 04/29/2017 1648   BASOSABS 0.0 04/29/2017 1648    Chest Imaging: CT scan of the chest 02/29/2020: CTA of the chest coronary calcifications present, enlarged right heart, elevated right ventricular pressures and dilated pulmonary artery. The patient's images have been independently reviewed by me.    Pulmonary Functions Testing Results: No flowsheet data found.  FeNO:   Pathology:   Echocardiogram:   IMPRESSIONS  1. Left ventricular ejection fraction, by estimation, is 55 to 60%. The  left ventricle has normal function. The left ventricle has no regional  wall motion abnormalities. Left ventricular diastolic function could not  be evaluated.  2. Right ventricular systolic function is normal. The right ventricular  size is normal. There is normal pulmonary artery systolic pressure.  3. Left atrial size was mild to moderately dilated.  4. Right atrial size was moderately dilated.  5. The mitral valve is normal in structure. No evidence of mitral valve  regurgitation. No evidence of mitral stenosis.  6. The aortic valve is tricuspid. Aortic valve regurgitation is not  visualized. No aortic stenosis is present.   Heart Catheterization:     Assessment & Plan:     ICD-10-CM   1. DOE (dyspnea on exertion)  R06.00 Pulmonary Function Test  2. Right ventricular dysfunction  I51.9   3. History of cardiac radiofrequency ablation  Z98.890   4. Pulmonary hypertension ([place])  I27.20     Discussion:  This is a 75 year old gentleman with dyspnea on exertion bilateral lower extremity edema, history of heart failure, history of a flutter, A.  fib, prior pulmonary vein ablation.  CT imaging with enlarged pulmonary arteries, enlarged RV and RA concerning for an underlying diagnosis of pulmonary hypertension.  Plan: All discussed with Dr. Caryl Comes and one of my colleagues Dr. Silas Flood who has a special interest in pulmonary hypertension. I will start with full pulmonary function test as well as a DLCO to see if it suggestive of PAH. I think he may benefit from a right heart catheterization at some point time. Needs to continue his diuretics. Patient was counseled on making sure to maintain a dry weight. We appreciate the consultation. Do not hesitate to reach out with any questions or concerns. Additional time spent today reviewing previous cardiac work-up, previous cardiac interventions and CT imaging.    Current Outpatient Medications:  .  cetirizine (ZYRTEC) 10 MG tablet, Take 10 mg by mouth daily., Disp: , Rfl:  .  COVID-19 mRNA Vac-TriS, Pfizer, (PFIZER-BIONT COVID-19 VAC-TRIS) SUSP injection, Inject into the muscle., Disp: 0.3 mL, Rfl: 0 .  diphenhydrAMINE (BENADRYL) 25 MG tablet, Take 50 mg by mouth at bedtime., Disp: , Rfl:  .  fluticasone (FLONASE) 50 MCG/ACT nasal spray, Place 1-2 sprays into both nostrils at bedtime. , Disp: , Rfl:  .  furosemide (LASIX) 40 MG tablet, TAKE 3 TABLETS DAILY, Disp: 270 tablet, Rfl: 3 .  metolazone (ZAROXOLYN) 2.5 MG tablet, Take 1 tablet (  2.5 mg total) by mouth as needed (edema)., Disp: 90 tablet, Rfl: 2 .  OVER THE COUNTER MEDICATION, Take 1 tablet by mouth daily., Disp: , Rfl:  .  OVER THE COUNTER MEDICATION, Take 1 Scoop by mouth daily., Disp: , Rfl:  .  VIAGRA 100 MG tablet, Take 100 mg by mouth as needed for erectile dysfunction. , Disp: , Rfl:  .  XARELTO 20 MG TABS tablet, TAKE 1 TABLET DAILY WITH SUPPER, Disp: 90 tablet, Rfl: 1  I spent 61 minutes dedicated to the care of this patient on the date of this encounter to include pre-visit review of records, face-to-face time with the  patient discussing conditions above, post visit ordering of testing, clinical documentation with the electronic health record, making appropriate referrals as documented, and communicating necessary findings to members of the patients care team.   Garner Nash, [facility] 12/18/2020 3:01 PM

## 2020-12-18 NOTE — Patient Instructions (Signed)
Thank you for visiting Dr. Valeta Harms at [facility]. Today we recommend the following:  Orders Placed This Encounter  Procedures  . Pulmonary Function Test   I will reach out to Dr. Caryl Comes.   Return in about 4 weeks (around 01/15/2021) for W/ Dr. Silas Flood.    Please do your part to reduce the spread of COVID-19.

## 2020-12-20 ENCOUNTER — Telehealth: Payer: Self-pay | Admitting: *Deleted

## 2020-12-20 DIAGNOSIS — R195 Other fecal abnormalities: Secondary | ICD-10-CM | POA: Diagnosis not present

## 2020-12-20 DIAGNOSIS — I4891 Unspecified atrial fibrillation: Secondary | ICD-10-CM | POA: Diagnosis not present

## 2020-12-20 DIAGNOSIS — Z7901 Long term (current) use of anticoagulants: Secondary | ICD-10-CM | POA: Diagnosis not present

## 2020-12-20 DIAGNOSIS — Z800 Family history of malignant neoplasm of digestive organs: Secondary | ICD-10-CM | POA: Diagnosis not present

## 2020-12-20 NOTE — Telephone Encounter (Signed)
   [facility] Pre-operative Risk Assessment    Patient Name: [name]  DOB: [birth year]  MRN: [id number]   HEARTCARE STAFF: - Please ensure there is not already an duplicate clearance open for this procedure. - Under Visit Info/Reason for Call, type in Other and utilize the format Clearance MM/DD/YY or Clearance TBD. Do not use dashes or single digits. - If request is for dental extraction, please clarify the # of teeth to be extracted.  Request for surgical clearance:  1. What type of surgery is being performed? COLONOSCOPY (+ FIT TEST)  2. When is this surgery scheduled? 04/04/21   3. What type of clearance is required (medical clearance vs. Pharmacy clearance to hold med vs. Both)? BOTH  4. Are there any medications that need to be held prior to surgery and how long? XARELTO x  3 DAYS PRIOR TO PROCEDURE   5. Practice name and name of physician performing surgery? [facility]; DR. Gwyndolyn Saxon [facility]   6. What is the office phone number? [phone]   7.   What is the office fax number? [phone]  8.   Anesthesia type (None, local, MAC, general) ? PROPOFOL   Julaine Hua 12/20/2020, 5:28 PM  _________________________________________________________________   (provider comments below)

## 2020-12-21 NOTE — Telephone Encounter (Signed)
Patient with diagnosis of afib on Xarelto for anticoagulation.    Procedure: COLONOSCOPY (+ FIT TEST) Date of procedure: 04/04/21  CHA2DS2-VASc Score = 5  This indicates a 7.2% annual risk of stroke. The patient's score is based upon: CHF History: Yes HTN History: Yes Diabetes History: No Stroke History: No Vascular Disease History: Yes Age Score: 2 Gender Score: 0     CrCl 68.3  Per office protocol, patient can hold Xarelto for 3 days prior to procedure.

## 2020-12-21 NOTE — Telephone Encounter (Signed)
   Primary Cardiologist: Virl Axe, MD  Chart reviewed as part of pre-operative protocol coverage. Given past medical history and time since last visit, based on ACC/AHA guidelines, [name] would be at acceptable risk for the planned procedure without further cardiovascular testing.   Patient with diagnosis of afib on Xarelto for anticoagulation.    Procedure: COLONOSCOPY (+ FIT TEST) Date of procedure: 04/04/21  CHA2DS2-VASc Score = 5  This indicates a 7.2% annual risk of stroke. The patient's score is based upon: CHF History: Yes HTN History: Yes Diabetes History: No Stroke History: No Vascular Disease History: Yes Age Score: 2 Gender Score: 0     CrCl 68.3  Per office protocol, patient can hold Xarelto for 3 days prior to procedure.  I will route this recommendation to the requesting party via Epic fax function and remove from pre-op pool.  Please call with questions.  Jossie Ng. Andera Cranmer NP-C    12/21/2020, 5:09 PM [place] [address] Office [phone] Fax [phone]

## 2021-01-03 ENCOUNTER — Ambulatory Visit (INDEPENDENT_AMBULATORY_CARE_PROVIDER_SITE_OTHER): Payer: Medicare Other

## 2021-01-03 ENCOUNTER — Other Ambulatory Visit: Payer: Self-pay | Admitting: Internal Medicine

## 2021-01-03 DIAGNOSIS — I495 Sick sinus syndrome: Secondary | ICD-10-CM | POA: Diagnosis not present

## 2021-01-03 DIAGNOSIS — R109 Unspecified abdominal pain: Secondary | ICD-10-CM | POA: Diagnosis not present

## 2021-01-03 LAB — CUP PACEART REMOTE DEVICE CHECK
Battery Remaining Longevity: 30 mo
Battery Remaining Percentage: 36 %
Brady Statistic RA Percent Paced: 0 %
Brady Statistic RV Percent Paced: 51 %
Date Time Interrogation Session: 20220601065700
Implantable Lead Implant Date: 20140421
Implantable Lead Implant Date: 20140421
Implantable Lead Location: 753859
Implantable Lead Location: 753860
Implantable Lead Model: 5076
Implantable Lead Model: 5076
Implantable Pulse Generator Implant Date: 20140421
Lead Channel Impedance Value: 561 Ohm
Lead Channel Impedance Value: 695 Ohm
Lead Channel Pacing Threshold Amplitude: 0.4 V
Lead Channel Pacing Threshold Amplitude: 1 V
Lead Channel Pacing Threshold Pulse Width: 0.4 ms
Lead Channel Pacing Threshold Pulse Width: 0.4 ms
Lead Channel Setting Pacing Amplitude: 1.5 V
Lead Channel Setting Pacing Amplitude: 2.5 V
Lead Channel Setting Pacing Pulse Width: 0.4 ms
Lead Channel Setting Sensing Sensitivity: 2.5 mV
Pulse Gen Serial Number: 112305

## 2021-01-03 NOTE — Telephone Encounter (Signed)
Pt last saw Dr Caryl Comes 09/26/20, last labs 04/04/20 Creat 0.98, age 75, weight 94.3kg, CrCl 85.53, based on CrCl pt is on appropriate dosage of Xarelto 20mg  QD for afib.  Will refill rx.

## 2021-01-04 ENCOUNTER — Other Ambulatory Visit: Payer: Self-pay | Admitting: Internal Medicine

## 2021-01-11 ENCOUNTER — Other Ambulatory Visit: Payer: Self-pay

## 2021-01-12 ENCOUNTER — Encounter: Payer: Self-pay | Admitting: Allergy

## 2021-01-12 ENCOUNTER — Ambulatory Visit (INDEPENDENT_AMBULATORY_CARE_PROVIDER_SITE_OTHER): Payer: Medicare Other | Admitting: Allergy

## 2021-01-12 VITALS — BP 134/82 | HR 70 | Temp 97.1°F | Resp 18 | Ht 71.0 in | Wt 204.8 lb

## 2021-01-12 DIAGNOSIS — J3089 Other allergic rhinitis: Secondary | ICD-10-CM | POA: Diagnosis not present

## 2021-01-12 DIAGNOSIS — R0982 Postnasal drip: Secondary | ICD-10-CM | POA: Diagnosis not present

## 2021-01-12 MED ORDER — LEVOCETIRIZINE DIHYDROCHLORIDE 5 MG PO TABS
5.0000 mg | ORAL_TABLET | Freq: Every evening | ORAL | 5 refills | Status: DC
Start: 2021-01-12 — End: 2021-04-12

## 2021-01-12 MED ORDER — IPRATROPIUM BROMIDE 0.06 % NA SOLN: 2.0000 | Freq: Four times a day (QID) | NASAL | 5 refills | Status: DC

## 2021-01-12 NOTE — Patient Instructions (Signed)
Rhinitis with significant post-nasal drainage - Continue avoidance measures for dust mite, mold,cat and cockroach - recommend use of nasal saline spray and blow your nose prior to use of medicated nasal spays - Trial nasal Atrovent 2 sprays each nostril twice a day.  Can be use up to 4 times a day as needed for nasal drainage control.  Atrovent also can help with congestion however if still noticing more congestion can still use Flonase if needed - recommend rotating your antihistamine and would try Xyzal (Levocetirizine) 5mg  at this time.  Xyzal would replace Zyrtec for now.  If Xyzal is effective can rotate between Xyzal and Zyrtec every 6 months or so  Follow up in 4 months or sooner as needed

## 2021-01-12 NOTE — Progress Notes (Signed)
New Patient Note  RE: [name] MRN: [id number] DOB: 02-19-1946 Date of Office Visit: 01/12/2021  Referring provider: [name], MD Primary care provider: [name], MD  Chief Complaint: Allergies  History of present illness: [name] is a 75 y.o. male presenting today for evaluation of allergic rhinitis.  He is a former patient of our practice last seen by myself on 09/03/2017.  He states he returns today as he continues to have similar symptoms as before.  He has been having a lot of thick nasal drainage and when it gets into his throat he has coughing fits where he may spit it out.   This is worse in the mornings but occurs throughout the day.  He states the mucus is "chewy".  He also may have sneezing fits.  He takes Flonase and Zyrtec in the morning. He tried the astelin spray after last visit and states it didn't seem to help with drainage control moreso than the zyrtec and flonase and thus he discontinued use.  Due to the potential side effect profile of Singulair he never started that medication. He states he will be having a pulmonary function test in a couple of weeks recommended by his cardiologist for further evaluation of his atrial fibrillation he reports.  His previous allergy testing was positive to dust mites, molds and equivocal to cat and cockroach  Review of systems: Review of Systems  Constitutional: Negative.   HENT:         See HPI  Eyes: Negative.   Respiratory:         See HPI  Cardiovascular: Negative.   Gastrointestinal: Negative.   Musculoskeletal: Negative.   Skin: Negative.   Neurological: Negative.    All other systems negative unless noted above in HPI  Past medical history: Past Medical History:  Diagnosis Date   1st degree AV block    Atrial flutter ([place])    s/p ablation 2006   CAD (coronary artery disease)    Mild 2 vessel disease per cath in 2006; myoview neg 2011   Chronic anticoagulation    on Xarelto - started 11/12/11    Depression 11-05-11   On Cymbalta   GERD (gastroesophageal reflux disease)    Hyperlipidemia    Hypertension    Major depression single episode, in partial remission ([place]) 12/18/2020   Normal nuclear stress test 2011   Persistent atrial fibrillation ([place])    recent new occurrence 3/13>>Tikosyn   PVC's (premature ventricular contractions)    Right ventricular enlargement    Mild noted echo 3/13   Sick sinus syndrome ([place]) 11/23/2012   Dr Caryl Comes   Tobacco abuse     Past surgical history: Past Surgical History:  Procedure Laterality Date   ABLATION OF DYSRHYTHMIC FOCUS  07/07/2018   ATRIAL FIBRILLATION ABLATION N/A 07/07/2018   Procedure: ATRIAL FIBRILLATION ABLATION;  Surgeon: [name], MD;  Location: [place] [facility];  Service: Cardiovascular;  Laterality: N/A;   CARDIAC CATHETERIZATION  11/1988, 12/07/2004   showing essentially normal left  ventricular function, moderate two-vessel disease (50% LAD after the third diagonal and a 60+% stenosis in the second obtuse marginal)          CARDIOVERSION N/A 12/05/2011   Procedure: CARDIOVERSION;  Surgeon: Deboraha Sprang, MD;  Location: [facility];  Service: Cardiovascular;  Laterality: N/A;   CARDIOVERSION N/A 08/07/2018   Procedure: CARDIOVERSION;  Surgeon: Lelon Perla, MD;  Location: [place];  Service: Cardiovascular;  Laterality:  N/A;   CARDIOVERSION N/A 10/09/2018   Procedure: CARDIOVERSION;  Surgeon: Pixie Casino, MD;  Location: [place];  Service: Cardiovascular;  Laterality: N/A;   [place]   left-retained hardware   [place]  01/28/2008   Recurrent right inguinal hernia Laparoscopic, preperitoneal repair of recurrent right inguinal hernia with mesh -- SURGEON:  Dr. Fanny Skates.   INGUINAL HERNIA REPAIR  11/06/2011   Procedure: HERNIA REPAIR INGUINAL ADULT;  Surgeon: Adin Hector, MD;  Location: [facility];  Service: General;  Laterality: Left;   PACEMAKER INSERTION   11/23/2012   Dr Caryl Comes   PERMANENT PACEMAKER INSERTION N/A 11/23/2012   Procedure: PERMANENT PACEMAKER INSERTION;  Surgeon: Deboraha Sprang, MD;  Location: [facility] CATH LAB;  Service: Cardiovascular;  Laterality: N/A;   TEE WITH CARDIOVERSION  2001, 2006   Invasive electrophysiology study, electroanatomical mapping,and radiofrequency catheter ablation. -- patient's arrhythmia was isthmus dependent flutter not withstanding the very long cycle length.  RF energy delivered across the cavotricuspid isthmus successfully interrupted conduction across the isthmus and eliminated the patient's sub-straight. PHYSICIAN:  Deboraha Sprang, M.D   TEE WITHOUT CARDIOVERSION  12/31/2011   Procedure: TRANSESOPHAGEAL ECHOCARDIOGRAM (TEE);  Surgeon: Josue Hector, MD;  Location: [place];  Service: Cardiovascular;  Laterality: N/A;  tba after for tikosyn   TEE WITHOUT CARDIOVERSION N/A 07/07/2018   Procedure: TRANSESOPHAGEAL ECHOCARDIOGRAM (TEE);  Surgeon: Acie Fredrickson Wonda Cheng, MD;  Location: [facility];  Service: Cardiovascular;  Laterality: N/A;   VASECTOMY      Family history:  Family History  Problem Relation Age of Onset   Heart attack Father 3   Cancer Father        colon   Kidney disease Father        End Stage   Coronary artery disease Father        Had Valve Replacement   Atrial fibrillation Mother        flutter   Heart failure Mother 54   Cancer Sister        colon   Cancer Paternal Uncle        lung    Social history: He lives in a home without carpeting with gas heating and central cooling.  There is a cat in the home.  There is no concern for water damage, mildew or roaches in the home.  The home is 77 years old.  He is retired. Tobacco Use   Smoking status: Former    Years: 4.00    Pack years: 0.00    Types: Cigarettes    Quit date: 08/05/1966    Years since quitting: 54.4   Smokeless tobacco: Never  Vaping Use   Vaping Use: Never used     Medication List: Current Outpatient  Medications  Medication Sig Dispense Refill   cetirizine (ZYRTEC) 10 MG tablet Take 10 mg by mouth daily.     COVID-19 mRNA Vac-TriS, Pfizer, (PFIZER-BIONT COVID-19 VAC-TRIS) SUSP injection Inject into the muscle. 0.3 mL 0   diphenhydrAMINE (BENADRYL) 25 MG tablet Take 50 mg by mouth at bedtime.     fluticasone (FLONASE) 50 MCG/ACT nasal spray Place 1-2 sprays into both nostrils at bedtime.      furosemide (LASIX) 40 MG tablet TAKE 3 TABLETS DAILY 270 tablet 3   ipratropium (ATROVENT) 0.06 % nasal spray Place 2 sprays into both nostrils 4 (four) times daily. 15 mL 5   levocetirizine (XYZAL) 5 MG tablet Take  1 tablet (5 mg total) by mouth every evening. 30 tablet 5   metolazone (ZAROXOLYN) 2.5 MG tablet TAKE 1 TABLET AS NEEDED FOR EDEMA 90 tablet 3   OVER THE COUNTER MEDICATION Take 1 tablet by mouth daily.     OVER THE COUNTER MEDICATION Take 1 Scoop by mouth daily.     VIAGRA 100 MG tablet Take 100 mg by mouth as needed for erectile dysfunction.      XARELTO 20 MG TABS tablet TAKE 1 TABLET DAILY WITH SUPPER 90 tablet 1   No current facility-administered medications for this visit.    Known medication allergies: Allergies  Allergen Reactions   Duloxetine Hcl     Other reaction(s): nausea, diarrhea   Horse-Derived Products Hives   Lexapro [Escitalopram] Other (See Comments)   Other     Other reaction(s): jaw clenching     Physical examination: Blood pressure 134/82, pulse 70, temperature (!) 97.1 F (36.2 C), resp. rate 18, height 5\' 11"  (1.803 m), weight 204 lb 12.8 oz (92.9 kg), SpO2 96 %.  General: Alert, interactive, in no acute distress. HEENT: PERRLA, TMs pearly gray, turbinates moderately edematous with clear discharge with a rightward nasal deviation, post-pharynx non erythematous. Neck: Supple without lymphadenopathy. Lungs: Clear to auscultation without wheezing, rhonchi or rales. {no increased work of breathing. CV: Normal S1, S2 without murmurs. Abdomen:  Nondistended, nontender. Skin: Warm and dry, without lesions or rashes. Extremities:  No clubbing, cyanosis or edema. Neuro:   Grossly intact.  Diagnositics/Labs: None today  Assessment and plan:   Rhinitis with significant post-nasal drainage - Continue avoidance measures for dust mite, mold,cat and cockroach - recommend use of nasal saline spray and blow your nose prior to use of medicated nasal spays - Trial nasal Atrovent 2 sprays each nostril twice a day.  Can be use up to 4 times a day as needed for nasal drainage control.  Atrovent also can help with congestion however if still noticing more congestion can still use Flonase if needed - recommend rotating your antihistamine and would try Xyzal (Levocetirizine) 5mg  at this time.  Xyzal would replace Zyrtec for now.  If Xyzal is effective can rotate between Xyzal and Zyrtec every 6 months or so  Follow up in 4 months or sooner as needed I appreciate the opportunity to take part in Fowler's care. Please do not hesitate to contact me with questions.  Sincerely,   Prudy Feeler, MD Allergy/Immunology [facility]

## 2021-01-22 ENCOUNTER — Other Ambulatory Visit (HOSPITAL_COMMUNITY)
Admission: RE | Admit: 2021-01-22 | Discharge: 2021-01-22 | Disposition: A | Discharge status: Home / Self Care | Payer: Medicare Other | Source: Ambulatory Visit | Attending: Pulmonary Disease | Admitting: Pulmonary Disease

## 2021-01-22 DIAGNOSIS — U071 COVID-19: Secondary | ICD-10-CM | POA: Insufficient documentation

## 2021-01-22 DIAGNOSIS — Z01812 Encounter for preprocedural laboratory examination: Secondary | ICD-10-CM | POA: Diagnosis not present

## 2021-01-22 LAB — SARS CORONAVIRUS 2 (TAT 6-24 HRS): SARS Coronavirus 2: POSITIVE — AB

## 2021-01-23 ENCOUNTER — Telehealth: Payer: Self-pay | Admitting: Pulmonary Disease

## 2021-01-23 NOTE — Telephone Encounter (Signed)
Patient was tested for covid yesterday for his PFT. His results were positive. I called him to let him know and to see if he has any symptoms. He said so far he does not have any symptoms, just his usual SOB and cough symptoms that he had before he tested positive.   He is scheduled to see MH tomorrow at 2pm. I have cancelled the PFT. MH is off today but I will send him a text to see if he wants to keep the visit as a video visit or televisit.

## 2021-01-24 ENCOUNTER — Ambulatory Visit: Payer: Medicare Other | Admitting: Pulmonary Disease

## 2021-01-26 NOTE — Progress Notes (Signed)
Remote pacemaker transmission.   

## 2021-02-08 DIAGNOSIS — H0288B Meibomian gland dysfunction left eye, upper and lower eyelids: Secondary | ICD-10-CM | POA: Diagnosis not present

## 2021-02-08 DIAGNOSIS — H02831 Dermatochalasis of right upper eyelid: Secondary | ICD-10-CM | POA: Diagnosis not present

## 2021-02-08 DIAGNOSIS — H02834 Dermatochalasis of left upper eyelid: Secondary | ICD-10-CM | POA: Diagnosis not present

## 2021-02-08 DIAGNOSIS — H02881 Meibomian gland dysfunction right upper eyelid: Secondary | ICD-10-CM | POA: Diagnosis not present

## 2021-03-14 DIAGNOSIS — L814 Other melanin hyperpigmentation: Secondary | ICD-10-CM | POA: Diagnosis not present

## 2021-03-14 DIAGNOSIS — D485 Neoplasm of uncertain behavior of skin: Secondary | ICD-10-CM | POA: Diagnosis not present

## 2021-03-14 DIAGNOSIS — D2271 Melanocytic nevi of right lower limb, including hip: Secondary | ICD-10-CM | POA: Diagnosis not present

## 2021-03-14 DIAGNOSIS — L821 Other seborrheic keratosis: Secondary | ICD-10-CM | POA: Diagnosis not present

## 2021-03-14 DIAGNOSIS — L820 Inflamed seborrheic keratosis: Secondary | ICD-10-CM | POA: Diagnosis not present

## 2021-03-14 DIAGNOSIS — D0462 Carcinoma in situ of skin of left upper limb, including shoulder: Secondary | ICD-10-CM | POA: Diagnosis not present

## 2021-03-14 DIAGNOSIS — Z85828 Personal history of other malignant neoplasm of skin: Secondary | ICD-10-CM | POA: Diagnosis not present

## 2021-03-14 DIAGNOSIS — L570 Actinic keratosis: Secondary | ICD-10-CM | POA: Diagnosis not present

## 2021-03-14 DIAGNOSIS — D2262 Melanocytic nevi of left upper limb, including shoulder: Secondary | ICD-10-CM | POA: Diagnosis not present

## 2021-03-14 DIAGNOSIS — D225 Melanocytic nevi of trunk: Secondary | ICD-10-CM | POA: Diagnosis not present

## 2021-03-14 DIAGNOSIS — L578 Other skin changes due to chronic exposure to nonionizing radiation: Secondary | ICD-10-CM | POA: Diagnosis not present

## 2021-04-02 ENCOUNTER — Other Ambulatory Visit: Payer: Self-pay | Admitting: Internal Medicine

## 2021-04-04 ENCOUNTER — Ambulatory Visit (INDEPENDENT_AMBULATORY_CARE_PROVIDER_SITE_OTHER): Payer: Medicare Other

## 2021-04-04 DIAGNOSIS — K5730 Diverticulosis of large intestine without perforation or abscess without bleeding: Secondary | ICD-10-CM | POA: Diagnosis not present

## 2021-04-04 DIAGNOSIS — K649 Unspecified hemorrhoids: Secondary | ICD-10-CM | POA: Diagnosis not present

## 2021-04-04 DIAGNOSIS — D124 Benign neoplasm of descending colon: Secondary | ICD-10-CM | POA: Diagnosis not present

## 2021-04-04 DIAGNOSIS — D123 Benign neoplasm of transverse colon: Secondary | ICD-10-CM | POA: Diagnosis not present

## 2021-04-04 DIAGNOSIS — I495 Sick sinus syndrome: Secondary | ICD-10-CM | POA: Diagnosis not present

## 2021-04-04 DIAGNOSIS — D122 Benign neoplasm of ascending colon: Secondary | ICD-10-CM | POA: Diagnosis not present

## 2021-04-04 DIAGNOSIS — R195 Other fecal abnormalities: Secondary | ICD-10-CM | POA: Diagnosis not present

## 2021-04-05 DIAGNOSIS — H02835 Dermatochalasis of left lower eyelid: Secondary | ICD-10-CM | POA: Diagnosis not present

## 2021-04-05 DIAGNOSIS — Z961 Presence of intraocular lens: Secondary | ICD-10-CM | POA: Diagnosis not present

## 2021-04-05 DIAGNOSIS — H10823 Rosacea conjunctivitis, bilateral: Secondary | ICD-10-CM | POA: Diagnosis not present

## 2021-04-10 DIAGNOSIS — D123 Benign neoplasm of transverse colon: Secondary | ICD-10-CM | POA: Diagnosis not present

## 2021-04-10 DIAGNOSIS — D124 Benign neoplasm of descending colon: Secondary | ICD-10-CM | POA: Diagnosis not present

## 2021-04-10 DIAGNOSIS — D122 Benign neoplasm of ascending colon: Secondary | ICD-10-CM | POA: Diagnosis not present

## 2021-04-10 LAB — CUP PACEART REMOTE DEVICE CHECK
Battery Remaining Longevity: 30 mo
Battery Remaining Percentage: 33 %
Brady Statistic RA Percent Paced: 0 %
Brady Statistic RV Percent Paced: 53 %
Date Time Interrogation Session: 20220831101100
Implantable Lead Implant Date: 20140421
Implantable Lead Implant Date: 20140421
Implantable Lead Location: 753859
Implantable Lead Location: 753860
Implantable Lead Model: 5076
Implantable Lead Model: 5076
Implantable Pulse Generator Implant Date: 20140421
Lead Channel Impedance Value: 621 Ohm
Lead Channel Impedance Value: 714 Ohm
Lead Channel Pacing Threshold Amplitude: 0.4 V
Lead Channel Pacing Threshold Amplitude: 1 V
Lead Channel Pacing Threshold Pulse Width: 0.4 ms
Lead Channel Pacing Threshold Pulse Width: 0.4 ms
Lead Channel Setting Pacing Amplitude: 1.5 V
Lead Channel Setting Pacing Amplitude: 2.5 V
Lead Channel Setting Pacing Pulse Width: 0.4 ms
Lead Channel Setting Sensing Sensitivity: 2.5 mV
Pulse Gen Serial Number: 112305

## 2021-04-12 ENCOUNTER — Ambulatory Visit (INDEPENDENT_AMBULATORY_CARE_PROVIDER_SITE_OTHER): Payer: Medicare Other | Admitting: Pulmonary Disease

## 2021-04-12 ENCOUNTER — Encounter: Payer: Self-pay | Admitting: Pulmonary Disease

## 2021-04-12 ENCOUNTER — Other Ambulatory Visit: Payer: Self-pay

## 2021-04-12 VITALS — BP 124/68 | HR 68 | Temp 97.3°F | Ht 71.0 in | Wt 203.0 lb

## 2021-04-12 DIAGNOSIS — J3089 Other allergic rhinitis: Secondary | ICD-10-CM

## 2021-04-12 DIAGNOSIS — R0600 Dyspnea, unspecified: Secondary | ICD-10-CM

## 2021-04-12 DIAGNOSIS — Z8679 Personal history of other diseases of the circulatory system: Secondary | ICD-10-CM | POA: Diagnosis not present

## 2021-04-12 DIAGNOSIS — R0609 Other forms of dyspnea: Secondary | ICD-10-CM

## 2021-04-12 MED ORDER — ANORO ELLIPTA 62.5-25 MCG/INH IN AEPB: 1.0000 | INHALATION_SPRAY | Freq: Every day | RESPIRATORY_TRACT | 2 refills | Status: DC

## 2021-04-12 MED ORDER — ANORO ELLIPTA 62.5-25 MCG/INH IN AEPB: 1.0000 | INHALATION_SPRAY | Freq: Every day | RESPIRATORY_TRACT | 0 refills | Status: DC

## 2021-04-12 NOTE — Patient Instructions (Signed)
Full PFT performed today. °

## 2021-04-12 NOTE — Patient Instructions (Signed)
Nice to meet you  Your prior ultrasound as well as your breathing test today are not suggestive of pulmonary hypertension  Your breathing test did show gas trapping.  Use Anoro 1 puff daily to help open up the airways and get the air or gas out.  If you experience side effect such as urinary retention or constipation please stop the medication let me know.  Return to clinic in 3 months or sooner as needed with Dr. Silas Flood.

## 2021-04-12 NOTE — Progress Notes (Signed)
Full PFT performed today. °

## 2021-04-12 NOTE — Progress Notes (Signed)
Patient seen in the office today and instructed on use of Anoro.  Patient expressed understanding and demonstrated technique.  Benetta Spar [facility] 04/12/21

## 2021-04-16 LAB — PULMONARY FUNCTION TEST
DL/VA % pred: 97 %
DL/VA: 3.8 ml/min/mmHg/L
DLCO cor % pred: 108 %
DLCO cor: 27.88 ml/min/mmHg
DLCO unc % pred: 108 %
DLCO unc: 27.88 ml/min/mmHg
FEF 25-75 Post: 1.69 L/sec
FEF 25-75 Pre: 1.41 L/sec
FEF2575-%Change-Post: 19 %
FEF2575-%Pred-Post: 76 %
FEF2575-%Pred-Pre: 63 %
FEV1-%Change-Post: 6 %
FEV1-%Pred-Post: 79 %
FEV1-%Pred-Pre: 74 %
FEV1-Post: 2.47 L
FEV1-Pre: 2.31 L
FEV1FVC-%Change-Post: 2 %
FEV1FVC-%Pred-Pre: 93 %
FEV6-%Change-Post: 4 %
FEV6-%Pred-Post: 88 %
FEV6-%Pred-Pre: 84 %
FEV6-Post: 3.58 L
FEV6-Pre: 3.44 L
FEV6FVC-%Change-Post: 0 %
FEV6FVC-%Pred-Post: 106 %
FEV6FVC-%Pred-Pre: 106 %
FVC-%Change-Post: 4 %
FVC-%Pred-Post: 82 %
FVC-%Pred-Pre: 79 %
FVC-Post: 3.59 L
FVC-Pre: 3.45 L
Post FEV1/FVC ratio: 69 %
Post FEV6/FVC ratio: 100 %
Pre FEV1/FVC ratio: 67 %
Pre FEV6/FVC Ratio: 100 %
RV % pred: 152 %
RV: 4.03 L
TLC % pred: 105 %
TLC: 7.62 L

## 2021-04-16 NOTE — Progress Notes (Signed)
$'[name]$  ID: [name], male    DOB: 10/29/1945, 75 y.o.   MRN: [id number]  Chief Complaint  Patient presents with   Establish Care    Referred by Dr. Valeta Harms for possible pulmonary HTN. Completed his PFT today as well.     Referring provider: [name], MD  HPI:   75 y.o. man whom we are seeing in consultation for possible pulmonary hypertension.  Note from Dr. Valeta Harms reviewed.  Most recent cardiology note from Dr. Caryl Comes reviewed.  Most recent cardiology APP note reviewed.  Patient referred given enlarged pulmonary artery seen on CT angio chest 02/2020.  Notably had an echocardiogram 03/2020 that on my review shows normal estimated PASP, normal RV size and function, left atrial dilation presumed from diastolic dysfunction (Not commented) and RA dilation.  He has chronic dyspnea on exertion.  He attributes this to timing of placement of pacemaker.  Ever since then.  He has less stamina, cannot walk as well.  Seem much worse after development of A. fib.  He has had a pulmonary vein ablation for A. fib.  Did not work.  Persistent A. fib.  Denies significant worsening of symptoms after ablation.  He has minimal smoking history, smoked in college for couple years.  Dyspnea worse with inclines or stairs.  Present on flat surfaces.  No time of day when things are better or worse.  No positional changes make things better or worse.  No seasonal or environmental factors make things better or worse.  He does have history of seasonal allergies.  Has not tried any medicines or inhalers to help with the breathing.  No alleviating or exacerbating factors otherwise identified.  Discussed in detail results of his PFTs today which are discussed further below but largely reassuring with normal DLCO low concern for pulmonary hypertension.  CTA chest 02/2020 personally viewed interpreted as clear lungs bilaterally.  PMH: Diastolic dysfunction, A. fib Surgical history: Pulmonary vein ablation, cardioversion,  hernia repair, pacemaker insertion Family history: Father with CAD, colon cancer, ESRD, mother with CHF Social history: Minimal smoking history couple years in college, lives in [place] / Pulmonary Flowsheets:   ACT:  No flowsheet data found.  MMRC: mMRC Dyspnea Scale mMRC Score  12/18/2020 1    [place]:  No flowsheet data found.  Tests:   FENO:  No results found for: NITRICOXIDE  PFT: 04/12/2021 personally reviewed and interpreted as spirometry is normal.  No bronchodilator response.  TLC within normal limits.  Evidence of gas trapping.  DLCO within normal limits.  WALK:  No flowsheet data found.  Imaging: Personally reviewed and as per EMR discussion in this note CUP [place]  Result Date: 04/10/2021 Scheduled remote reviewed. Normal device function.  Known permanent AF, on [place], there is good ventricular rate control.  There was one short NSVT arrhythmia detected. Next remote 91 days. Kathy Breach, RN, CCDS, [employer]   Lab Results: Personally reviewed CBC    Component Value Date/Time   WBC 5.3 08/18/2019 1125   WBC 6.1 08/03/2018 1424   RBC 4.63 08/18/2019 1125   RBC 4.77 08/03/2018 1424   HGB 15.7 08/18/2019 1125   HCT 43.9 08/18/2019 1125   PLT 211 08/18/2019 1125   MCV 95 08/18/2019 1125   MCH 33.9 (H) 08/18/2019 1125   MCH 33.3 08/03/2018 1424   MCHC 35.8 (H) 08/18/2019 1125   MCHC 33.8 08/03/2018 1424   RDW 12.5 08/18/2019 1125   LYMPHSABS  2.4 04/29/2017 1648   MONOABS 0.6 11/19/2012 0952   EOSABS 0.2 04/29/2017 1648   BASOSABS 0.0 04/29/2017 1648    BMET    Component Value Date/Time   NA 138 04/04/2020 1449   K 3.9 04/04/2020 1449   CL 92 (L) 04/04/2020 1449   CO2 31 (H) 04/04/2020 1449   GLUCOSE 132 (H) 04/04/2020 1449   GLUCOSE 91 10/09/2018 0810   BUN 15 04/04/2020 1449   CREATININE 0.98 04/04/2020 1449   CREATININE 0.93 04/03/2016 1636   CALCIUM 9.7 04/04/2020 1449   GFRNONAA 75  04/04/2020 1449   GFRAA 86 04/04/2020 1449    BNP No results found for: BNP  ProBNP No results found for: PROBNP  Specialty Problems       Pulmonary Problems   Dyspnea on exertion   Other allergic rhinitis   Wheezing   Allergic rhinitis   Allergic rhinitis due to house dust mite    Allergies  Allergen Reactions   Duloxetine Hcl     Other reaction(s): nausea, diarrhea   Horse-Derived Products Hives   Lexapro [Escitalopram] Other (See Comments)   Other     Other reaction(s): jaw clenching    Immunization History  Administered Date(s) Administered   PFIZER Comirnaty(Gray Top)Covid-19 Tri-Sucrose Vaccine 11/30/2020   PFIZER(Purple Top)SARS-COV-2 Vaccination 08/25/2019, 09/13/2019, 05/16/2020   Pneumococcal Conjugate-13 09/22/2014   Pneumococcal Polysaccharide-23 08/12/2011   Pneumococcal-Unspecified 09/02/2011   Tdap 09/22/2006   Zoster, Live 09/09/2013    Past Medical History:  Diagnosis Date   1st degree AV block    Atrial flutter ([place])    s/p ablation 2006   CAD (coronary artery disease)    Mild 2 vessel disease per cath in 2006; myoview neg 2011   Chronic anticoagulation    on Xarelto - started 11/12/11   Depression 11-05-11   On Cymbalta   GERD (gastroesophageal reflux disease)    Hyperlipidemia    Hypertension    Major depression single episode, in partial remission ([place]) 12/18/2020   Normal nuclear stress test 2011   Persistent atrial fibrillation ([facility])    recent new occurrence 3/13>>Tikosyn   PVC's (premature ventricular contractions)    Right ventricular enlargement    Mild noted echo 3/13   Sick sinus syndrome ([place]) 11/23/2012   Dr Caryl Comes   Tobacco abuse     Tobacco History: Social History   Tobacco Use  Smoking Status Former   Years: 4.00   Types: Cigarettes   Quit date: 08/05/1966   Years since quitting: 54.7  Smokeless Tobacco Never   Counseling given: Not Answered   Continue to not smoke  Outpatient Encounter Medications as of  04/12/2021  Medication Sig   cetirizine (ZYRTEC) 10 MG tablet Take 10 mg by mouth daily.   COVID-19 mRNA Vac-TriS, Pfizer, (PFIZER-BIONT COVID-19 VAC-TRIS) SUSP injection Inject into the muscle.   diphenhydrAMINE (BENADRYL) 25 MG tablet Take 50 mg by mouth at bedtime.   fluticasone (FLONASE) 50 MCG/ACT nasal spray Place 1-2 sprays into both nostrils at bedtime.    furosemide (LASIX) 40 MG tablet TAKE 3 TABLETS DAILY   metolazone (ZAROXOLYN) 2.5 MG tablet TAKE 1 TABLET AS NEEDED FOR EDEMA   OVER THE COUNTER MEDICATION Take 1 tablet by mouth daily.   OVER THE COUNTER MEDICATION Take 1 Scoop by mouth daily.   umeclidinium-vilanterol (ANORO ELLIPTA) 62.5-25 MCG/INH AEPB Inhale 1 puff into the lungs daily.   umeclidinium-vilanterol (ANORO ELLIPTA) 62.5-25 MCG/INH AEPB Inhale 1 puff into the lungs daily.   VIAGRA  100 MG tablet Take 100 mg by mouth as needed for erectile dysfunction.    XARELTO 20 MG TABS tablet TAKE 1 TABLET DAILY WITH SUPPER   [DISCONTINUED] ipratropium (ATROVENT) 0.06 % nasal spray Place 2 sprays into both nostrils 4 (four) times daily.   [DISCONTINUED] levocetirizine (XYZAL) 5 MG tablet Take 1 tablet (5 mg total) by mouth every evening.   No facility-administered encounter medications on file as of 04/12/2021.     Review of Systems  Review of Systems  No chest pain exertion.  No orthopnea or PND.  No lower extremity swelling.  Comprehensive review of systems otherwise negative. Physical Exam  BP 124/68   Pulse 68   Temp (!) 97.3 F (36.3 C) (Oral)   Ht '5\' 11"'$  (1.803 m)   Wt 203 lb (92.1 kg)   SpO2 100% Comment: on RA  BMI 28.31 kg/m   Wt Readings from Last 5 Encounters:  04/12/21 203 lb (92.1 kg)  01/12/21 204 lb 12.8 oz (92.9 kg)  12/18/20 208 lb (94.3 kg)  09/26/20 200 lb (90.7 kg)  02/18/20 201 lb (91.2 kg)    BMI Readings from Last 5 Encounters:  04/12/21 28.31 kg/m  01/12/21 28.56 kg/m  12/18/20 28.61 kg/m  09/26/20 27.51 kg/m  02/18/20 28.03 kg/m      Physical Exam General: Well-appearing, sitting in exam chair Eyes: EOMI, no icterus Neck: Supple, no JVP appreciated sitting upright Pulmonary: Clear to auscultation, no wheezes Cardiovascular: Irregularly irregular rhythm, normal rate Abdomen: Nondistended, bowel sounds present MSK: No synovitis, no joint effusion Neuro: Normal gait, no weakness Psych: Normal mood, full affect   Assessment & Plan:   Dyspnea on exertion: Likely multifactorial and mostly related to cardiac issues.  Timing coincides with pacemaker placement so would wonder about chronotropic incompetence and certainly worsened after onset of A. fib.  PFTs today normal with the exception of gas trapping.  This could be contributing to his dyspnea.  While PA is slightly enlarged on CT scan in the past, he has no evidence of right RV dysfunction or elevated RVSP on TTE in the past it is DLCO is within normal limits, very low suspicion for pulmonary hypertension.  Could consider repeat TTE at the discretion of his cardiologist in the future.  Addressing gas trapping as below.  Asthma: Clinical diagnosis based on his history of atopic symptoms and dyspnea in the setting of gas trapping on lung volumes with minimal smoking history.  History of atopic symptoms. Trial of LAMA/LABA therapy with Anoro.  Has had intolerance to Atrovent nasal spray (constipation) so may need to change medications if develops unwanted side effects.   Return in about 3 months (around 07/12/2021).   Lanier Clam, MD 04/16/2021

## 2021-04-17 NOTE — Progress Notes (Signed)
Remote pacemaker transmission.   

## 2021-05-07 DIAGNOSIS — R972 Elevated prostate specific antigen [PSA]: Secondary | ICD-10-CM | POA: Diagnosis not present

## 2021-05-08 DIAGNOSIS — D0462 Carcinoma in situ of skin of left upper limb, including shoulder: Secondary | ICD-10-CM | POA: Diagnosis not present

## 2021-05-08 DIAGNOSIS — L719 Rosacea, unspecified: Secondary | ICD-10-CM | POA: Diagnosis not present

## 2021-05-08 DIAGNOSIS — L578 Other skin changes due to chronic exposure to nonionizing radiation: Secondary | ICD-10-CM | POA: Diagnosis not present

## 2021-05-12 IMAGING — CT CT ANGIO CHEST
3 of 7 series · 13 of 46 positions shown · IV contrast (omnipaque)
Comparison: Chest radiograph 11/24/2012

CLINICAL DATA: Atrial fibrillation/flutter with pacing,
hypertension and coronary artery disease

EXAM:
CT ANGIOGRAPHY CHEST WITH CONTRAST
TECHNIQUE: Multidetector CT imaging of the chest was performed using the
standard protocol during bolus administration of intravenous
contrast. Multiplanar CT image reconstructions and MIPs were
obtained to evaluate the vascular anatomy.
CONTRAST:  100mL OMNIPAQUE IOHEXOL 350 MG/ML SOLN

[Series 7: axial thins · axial · 0.57mm/px · z∈[-164,+138]mm · 8 of 378 slices shown]
[im 38/378  lung]
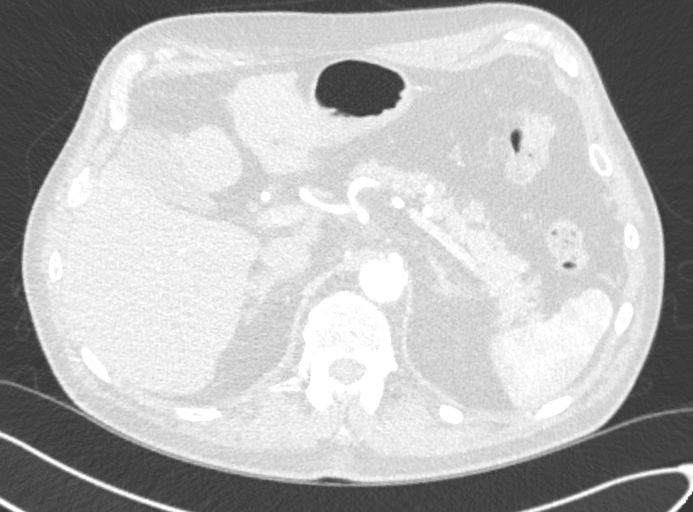
[im 76/378  lung]
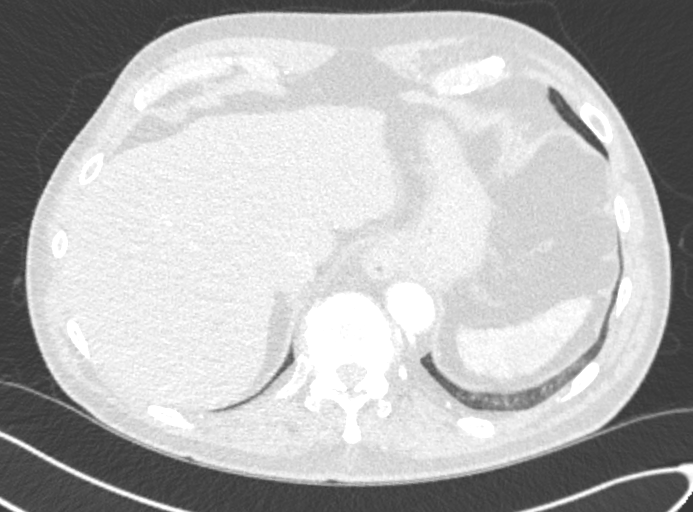
[im 114/378  lung]
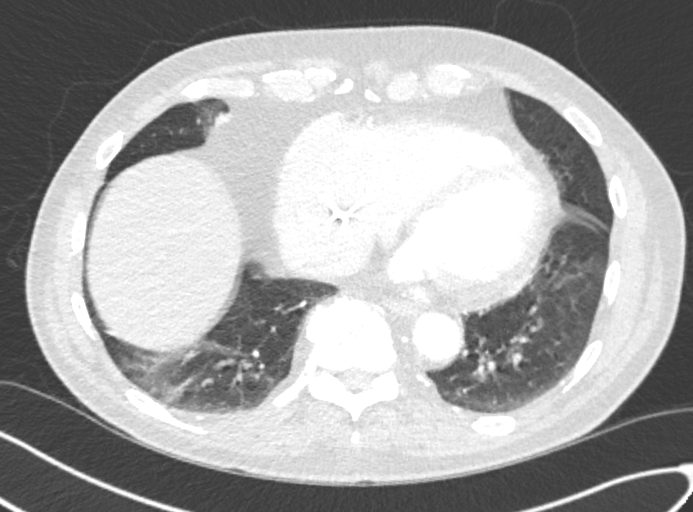
[im 170/378  lung]
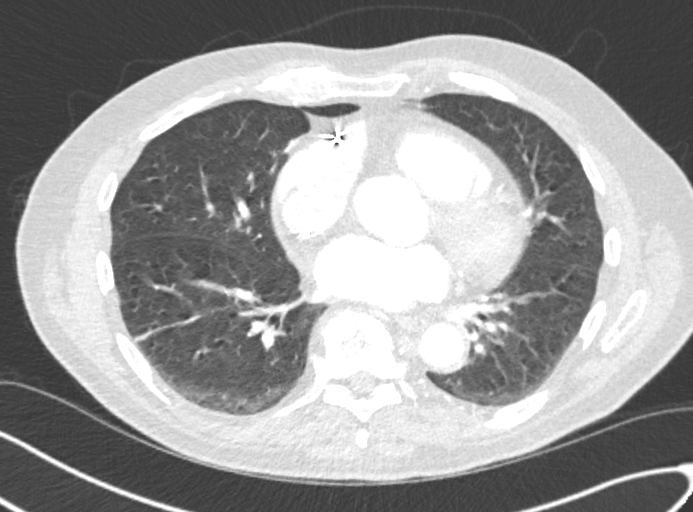
[im 208/378  lung]
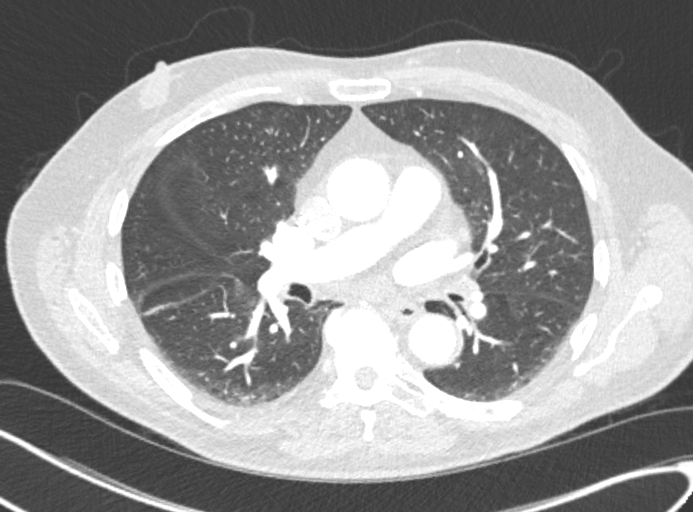
[im 264/378  lung]
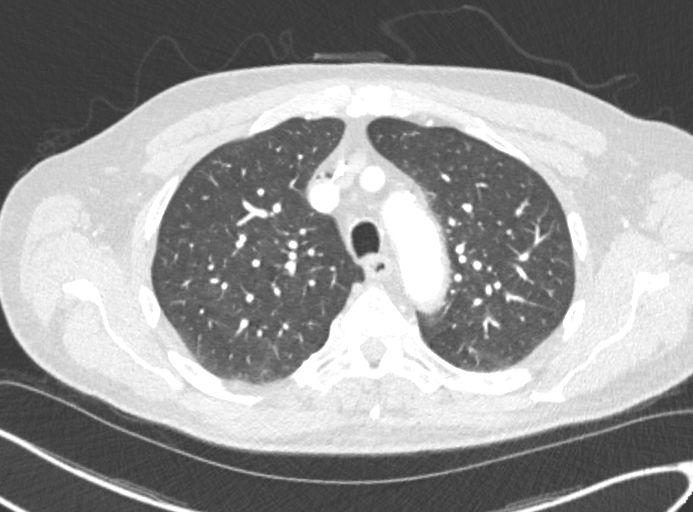
[im 302/378  lung]
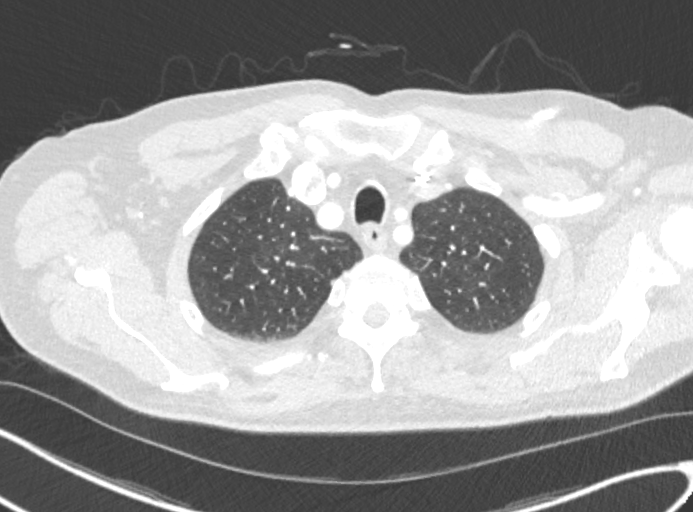
[im 340/378  lung]
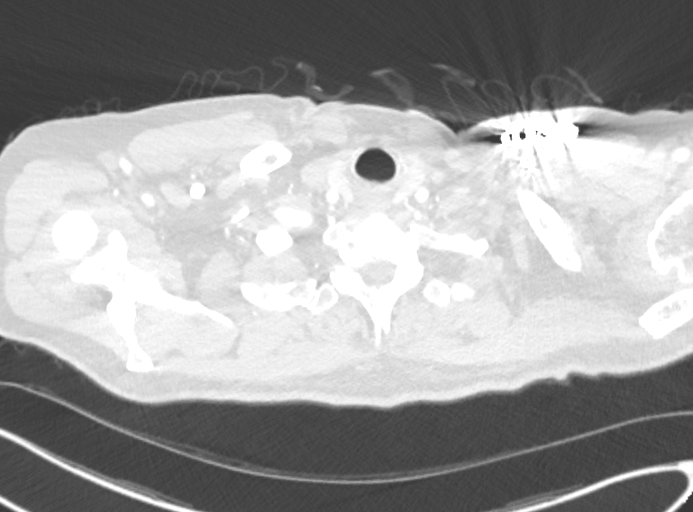

[Series 8: coronal thins · coronal · 0.77mm/px · 3 of 146 slices shown]
[im 49/146  soft-tissue]
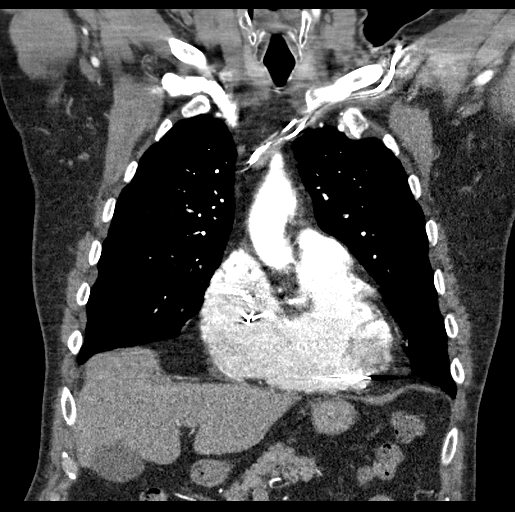
[im 73/146  soft-tissue]
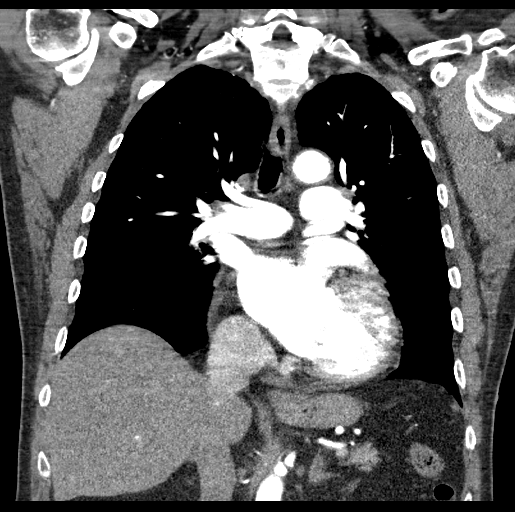
[im 97/146  soft-tissue]
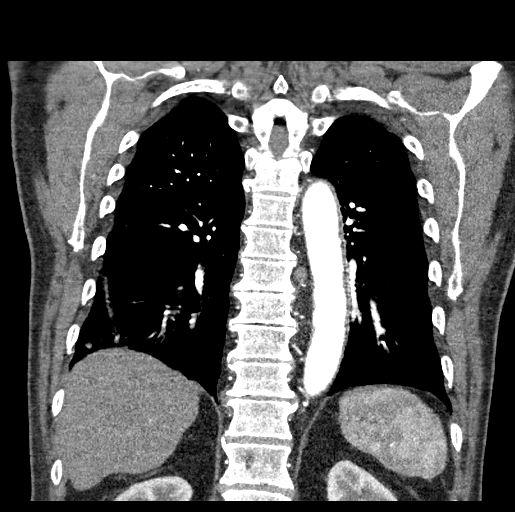

[Series 10: axial thick · axial · 0.57mm/px · z∈[-77,+53]mm · 2 of 79 slices shown]
[im 27/79  lung]
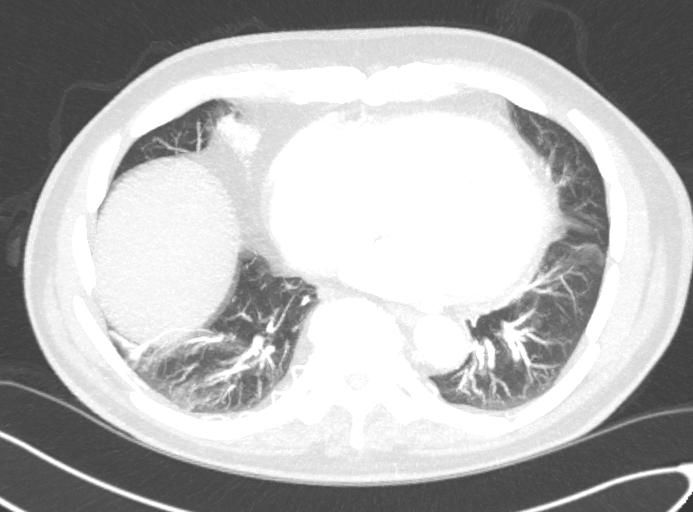
[im 53/79  soft-tissue]
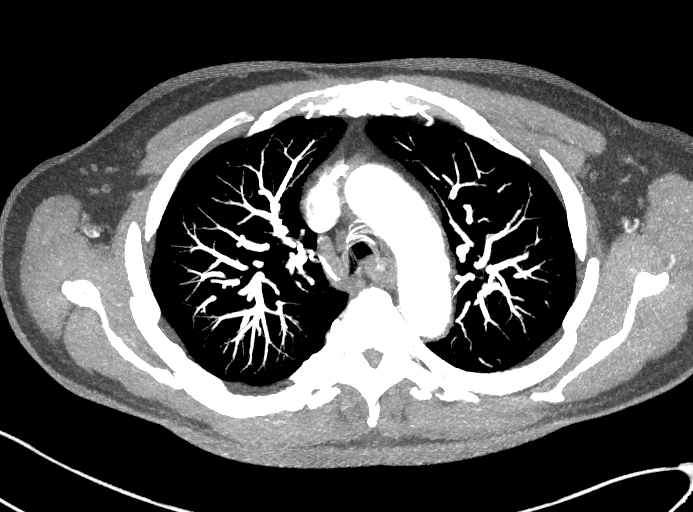

[13 of 46 positions shown; findings below may reference images not displayed]

FINDINGS: Cardiovascular: Satisfactory opacification of the thoracic aorta.
The aortic root is suboptimally assessed given cardiac pulsation
artifact. No acute luminal abnormality. No periaortic stranding or
hemorrhage. Atherosclerotic plaque noted throughout the thoracic
aorta predominant within the arch and descending vessel. There is
normal 3 vessel branching of the aortic arch with minimal plaque in
the proximal great vessels including a small amount of ostial plaque
at the origin of both vertebral arteries likely resulting in
mild-to-moderate ostial narrowing difficult to fully assess on this
non tailored wide field-of-view examination. Evaluation of the upper
abdominal aorta demonstrates atheromatous plaque with mild resulting
narrowing at the celiac artery origin. Out like configuration of the
celiac access with some poststenotic dilatation to 11 mm, such an
appearance could be seen with a compression of the median arcuate
ligament/median arcuate ligament syndrome. Minimal ostial plaque at
the SMA origin with patency of the imaged branches. The main left
and likely accessory upper pole right renal artery origins are
unremarkable.

Central pulmonary arteries are well opacified as well. No large
central, lobar or segmental pulmonary arterial filling defects are
identified. Pulmonary trunk is normal caliber with mild prominence
of the left and right main pulmonary arteries which could suggest a
degree of mild pulmonary artery hypertension.

There is cardiomegaly with predominantly right heart enlargement
including some reflux of contrast into the IVC suggestive of
elevated right heart pressures. A left chest wall pacer pack is
noted with leads terminating in the expected location within the
right atrium and cardiac apex. Coronary artery calcifications are
noted as well most pronounced in the LAD and left circumflex though
this nongated CT examination is not tailored for coronary anatomy
are calcium scoring.

No major venous abnormalities within the limitations of this
arterial phase angiographic study.

Mediastinum/Nodes: Small amount of fluid in the pericardial recesses
is within physiologic normal. No other mediastinal fluid or gas.
Normal thyroid gland and thoracic inlet. No acute abnormality of the
trachea or esophagus. No worrisome mediastinal, hilar or axillary
adenopathy.

Lungs/Pleura: Some dependent atelectatic changes are noted towards
the lung bases as well as more bandlike areas of opacity in both
lower lobes which could reflect further subsegmental atelectasis or
scarring. No consolidation, features of edema, pneumothorax, or
effusion. No suspicious pulmonary nodules or masses.

Upper Abdomen: No acute abnormalities present in the visualized
portions of the upper abdomen.

Musculoskeletal: Multilevel degenerative changes are present in the
imaged portions of the spine, most pronounced in the lower cervical
levels partially included within the margins of imaging. No acute
osseous abnormality or suspicious osseous lesion. Mild bilateral
gynecomastia. Left chest wall pacer pack, as above. No worrisome
chest wall lesions.

Review of the MIP images confirms the above findings.
IMPRESSION: 1. No acute aortic abnormality.
2. No evidence of pulmonary artery embolism. Mild prominence of the
left and right main pulmonary arteries could suggest some mild
pulmonary artery hypertension.
3. Additional features of cardiomegaly with right heart enlargement
including some reflux of contrast into the IVC suggestive of
elevated right heart pressures.
4. Coronary artery calcifications are present. Please note that the
presence of coronary artery calcium documents the presence of
coronary artery disease, the severity of this disease and any
potential stenosis cannot be assessed on this non-gated CT
examination. If further characterization is warranted, dedicated
cardiac CTA could be performed.
5. Aortic Atherosclerosis ([id number]).
6. Minimal plaque in the proximal great vessels resulting in some
mild to moderate ostial narrowing at the vertebral artery origins
bilaterally.
7. Mild ostial narrowing at the celiac and SMA origins.
8. Hook like configuration of the celiac trunk with some
poststenotic fusiform dilatation. Appearance suggestive compression
by the median arcuate ligament, possibly incidental though could
correlate for clinical features of median arcuate ligament syndrome.

## 2021-05-14 DIAGNOSIS — R972 Elevated prostate specific antigen [PSA]: Secondary | ICD-10-CM | POA: Diagnosis not present

## 2021-05-14 DIAGNOSIS — N401 Enlarged prostate with lower urinary tract symptoms: Secondary | ICD-10-CM | POA: Diagnosis not present

## 2021-05-14 DIAGNOSIS — R3912 Poor urinary stream: Secondary | ICD-10-CM | POA: Diagnosis not present

## 2021-05-14 DIAGNOSIS — N5201 Erectile dysfunction due to arterial insufficiency: Secondary | ICD-10-CM | POA: Diagnosis not present

## 2021-05-17 ENCOUNTER — Encounter: Payer: Self-pay | Admitting: Allergy

## 2021-05-17 ENCOUNTER — Ambulatory Visit (INDEPENDENT_AMBULATORY_CARE_PROVIDER_SITE_OTHER): Payer: Medicare Other | Admitting: Allergy

## 2021-05-17 ENCOUNTER — Other Ambulatory Visit: Payer: Self-pay

## 2021-05-17 VITALS — BP 120/52 | HR 62 | Temp 97.7°F | Ht 71.5 in | Wt 210.0 lb

## 2021-05-17 DIAGNOSIS — R0982 Postnasal drip: Secondary | ICD-10-CM | POA: Diagnosis not present

## 2021-05-17 DIAGNOSIS — J3089 Other allergic rhinitis: Secondary | ICD-10-CM

## 2021-05-17 NOTE — Progress Notes (Signed)
Follow-up Note  RE: [name] MRN: [id number] DOB: 08/05/1946 Date of Office Visit: 05/17/2021   History of present illness: [name] is a 75 y.o. male presenting today for follow-up of rhinitis with PND.  He was last seen in the office on 01/12/21 by myself.    Still having nasal drainage which causes him to cough.  He states the drainage is beige like color and he does note more drainage from the right nostril.  He did try the nasal Atrovent and states it was helpful however he felt like he was having a urinary retention and when he did look at the side effects it was listed as he did stop this medication.  That issue has resolved.  He did try Xyzal and states that he felt like it was causing constipation thus he stopped this as well.  He is back to using Zyrtec and Flonase which is his regimen he was taking previously but he is still having the drainage.   Review of systems: Review of Systems  Constitutional: Negative.   HENT:         See HPI  Eyes: Negative.   Respiratory: Negative.    Cardiovascular: Negative.   Gastrointestinal: Negative.   Musculoskeletal: Negative.   Skin: Negative.   Neurological: Negative.    All other systems negative unless noted above in HPI  Past medical/social/surgical/family history have been reviewed and are unchanged unless specifically indicated below.  No changes  Medication List: Current Outpatient Medications  Medication Sig Dispense Refill   cetirizine (ZYRTEC) 10 MG tablet Take 10 mg by mouth daily.     COVID-19 mRNA Vac-TriS, Pfizer, (PFIZER-BIONT COVID-19 VAC-TRIS) SUSP injection Inject into the muscle. 0.3 mL 0   diphenhydrAMINE (BENADRYL) 25 MG tablet Take 50 mg by mouth at bedtime.     fluticasone (FLONASE) 50 MCG/ACT nasal spray Place 1-2 sprays into both nostrils at bedtime.      furosemide (LASIX) 40 MG tablet TAKE 3 TABLETS DAILY 270 tablet 3   metolazone (ZAROXOLYN) 2.5 MG tablet TAKE 1 TABLET AS NEEDED FOR EDEMA 90  tablet 3   OVER THE COUNTER MEDICATION Take 1 tablet by mouth daily.     OVER THE COUNTER MEDICATION Take 1 Scoop by mouth daily.     VIAGRA 100 MG tablet Take 100 mg by mouth as needed for erectile dysfunction.      XARELTO 20 MG TABS tablet TAKE 1 TABLET DAILY WITH SUPPER 90 tablet 1   No current facility-administered medications for this visit.     Known medication allergies: Allergies  Allergen Reactions   Duloxetine Hcl     Other reaction(s): nausea, diarrhea   Horse-Derived Products Hives   Lexapro [Escitalopram] Other (See Comments)   Other     Other reaction(s): jaw clenching     Physical examination: Blood pressure (!) 120/52, pulse 62, temperature 97.7 F (36.5 C), height 5' 11.5" (1.816 m), weight 210 lb (95.3 kg), SpO2 95 %.  General: Alert, interactive, in no acute distress. HEENT: PERRLA, TMs pearly gray, turbinates minimally edematous with clear discharge, post-pharynx non erythematous. Neck: Supple without lymphadenopathy. Lungs: Clear to auscultation without wheezing, rhonchi or rales. {no increased work of breathing. CV: Normal S1, S2 without murmurs. Abdomen: Nondistended, nontender. Skin: Warm and dry, without lesions or rashes. Extremities:  No clubbing, cyanosis or edema. Neuro:   Grossly intact.  Diagnositics/Labs: None today  Assessment and plan:   Rhinitis with significant post-nasal drainage - Continue avoidance  measures for dust mite, mold,cat and cockroach - recommend use of nasal saline spray and blow your nose prior to use of medicated nasal spays - we have tried nasal Astelin and nasal Atrovent which have been ineffective or cause possible side effect - thus will try Ryaltris 2 sprays each nostril twice a day at this time.  Sample provided.  Let us know if this improves your nasal drainage.  Ryaltris has 2 medication in it: nasal steroid (mometasone) and nasal antihistamine (olopatadine).   While using Ryaltris hold your flonase - recommend  rotating your antihistamine between Zyrtec and Claritin (if effective.  Try Claritin at this time and if effective rotate with Zyrtec  Follow up in 4-6 months or sooner as needed Call or send a MyChart if Ryaltris is effective and then we can send prescription  I appreciate the opportunity to take part in [name]'s care. Please do not hesitate to contact me with questions.  Sincerely,   Prudy Feeler, MD Allergy/Immunology [facility]

## 2021-05-17 NOTE — Patient Instructions (Addendum)
Rhinitis with significant post-nasal drainage - Continue avoidance measures for dust mite, mold,cat and cockroach - recommend use of nasal saline spray and blow your nose prior to use of medicated nasal spays - we have tried nasal Astelin and nasal Atrovent which have been ineffective or cause possible side effect - thus will try Ryaltris 2 sprays each nostril twice a day at this time.  Sample provided.  Let us know if this improves your nasal drainage.  Ryaltris has 2 medication in it: nasal steroid (mometasone) and nasal antihistamine (olopatadine).   While using Ryaltris hold your flonase - recommend rotating your antihistamine between Zyrtec and Claritin (if effective.  Try Claritin at this time and if effective rotate with Zyrtec  Follow up in 4-6 months or sooner as needed Call or send a MyChart if Ryaltris is effective and then we can send prescription

## 2021-05-31 ENCOUNTER — Other Ambulatory Visit (HOSPITAL_BASED_OUTPATIENT_CLINIC_OR_DEPARTMENT_OTHER): Payer: Self-pay

## 2021-05-31 ENCOUNTER — Ambulatory Visit: Payer: Medicare Other | Attending: Internal Medicine

## 2021-05-31 DIAGNOSIS — Z23 Encounter for immunization: Secondary | ICD-10-CM

## 2021-05-31 MED ORDER — PFIZER COVID-19 VAC BIVALENT 30 MCG/0.3ML IM SUSP
INTRAMUSCULAR | 0 refills | Status: DC
  Filled 2021-05-31: qty 0.3, 1d supply, fill #0

## 2021-05-31 NOTE — Progress Notes (Signed)
   Covid-19 Vaccination Clinic  Name:  [name]    MRN: [id number] DOB: 30-May-1946  05/31/2021  [name] was observed post Covid-19 immunization for 15 minutes without incident. He was provided with Vaccine Information Sheet and instruction to access the V-Safe system.   [name] was instructed to call 911 with any severe reactions post vaccine: Difficulty breathing  Swelling of face and throat  A fast heartbeat  A bad rash all over body  Dizziness and weakness   Immunizations Administered     Name Date Dose VIS Date Route   Pfizer Covid-19 Vaccine Bivalent Booster 05/31/2021  1:13 PM 0.3 mL 04/04/2021 Intramuscular   Manufacturer: [place]   Lot: [id number]   [place]: [id number]

## 2021-06-13 ENCOUNTER — Telehealth: Payer: Self-pay | Admitting: Allergy

## 2021-06-13 ENCOUNTER — Other Ambulatory Visit: Payer: Self-pay | Admitting: *Deleted

## 2021-06-13 MED ORDER — RYALTRIS 665-25 MCG/ACT NA SUSP: 2.0000 | Freq: Two times a day (BID) | NASAL | 1 refills | Status: DC

## 2021-06-13 NOTE — Telephone Encounter (Signed)
Patient saw Dr. Nelva Bush on 05/17/21. She gave him a sample of Ryaltris. He is requesting a prescription for this to be sent to [facility] for a 90 supply. He said it will be cheaper than a 30 day supply at his local pharmacy.

## 2021-06-13 NOTE — Telephone Encounter (Signed)
Prescription has been sent in. Called patient and advised. Patient verbalized understanding.

## 2021-07-02 ENCOUNTER — Other Ambulatory Visit: Payer: Self-pay | Admitting: Internal Medicine

## 2021-07-02 NOTE — Telephone Encounter (Signed)
Xarelto 20mg  refill request received. Pt is 75 years old, weight-95.3kg, Crea-0.88 on 01/03/21 via [facility] from [place], and last seen by Dr. Caryl Comes on 09/26/2020 via Telemedicine visit, Diagnosis-Afib, CrCl-94.68ml/min; Dose is appropriate based on dosing criteria. Will send in refill to requested pharmacy.

## 2021-07-05 ENCOUNTER — Ambulatory Visit (INDEPENDENT_AMBULATORY_CARE_PROVIDER_SITE_OTHER): Payer: Medicare Other

## 2021-07-05 DIAGNOSIS — I495 Sick sinus syndrome: Secondary | ICD-10-CM | POA: Diagnosis not present

## 2021-07-06 LAB — CUP PACEART REMOTE DEVICE CHECK
Battery Remaining Longevity: 24 mo
Battery Remaining Percentage: 26 %
Brady Statistic RA Percent Paced: 0 %
Brady Statistic RV Percent Paced: 55 %
Date Time Interrogation Session: 20221201082400
Implantable Lead Implant Date: 20140421
Implantable Lead Implant Date: 20140421
Implantable Lead Location: 753859
Implantable Lead Location: 753860
Implantable Lead Model: 5076
Implantable Lead Model: 5076
Implantable Pulse Generator Implant Date: 20140421
Lead Channel Impedance Value: 600 Ohm
Lead Channel Impedance Value: 723 Ohm
Lead Channel Pacing Threshold Amplitude: 0.4 V
Lead Channel Pacing Threshold Amplitude: 0.8 V
Lead Channel Pacing Threshold Pulse Width: 0.4 ms
Lead Channel Pacing Threshold Pulse Width: 0.4 ms
Lead Channel Setting Pacing Amplitude: 1.3 V
Lead Channel Setting Pacing Amplitude: 2.5 V
Lead Channel Setting Pacing Pulse Width: 0.4 ms
Lead Channel Setting Sensing Sensitivity: 2.5 mV
Pulse Gen Serial Number: 112305

## 2021-07-12 DIAGNOSIS — H0288B Meibomian gland dysfunction left eye, upper and lower eyelids: Secondary | ICD-10-CM | POA: Diagnosis not present

## 2021-07-12 DIAGNOSIS — H02881 Meibomian gland dysfunction right upper eyelid: Secondary | ICD-10-CM | POA: Diagnosis not present

## 2021-07-12 DIAGNOSIS — Z961 Presence of intraocular lens: Secondary | ICD-10-CM | POA: Diagnosis not present

## 2021-07-12 DIAGNOSIS — H43811 Vitreous degeneration, right eye: Secondary | ICD-10-CM | POA: Diagnosis not present

## 2021-07-17 NOTE — Progress Notes (Signed)
Remote pacemaker transmission.   

## 2021-09-18 DIAGNOSIS — L821 Other seborrheic keratosis: Secondary | ICD-10-CM | POA: Diagnosis not present

## 2021-09-18 DIAGNOSIS — D2271 Melanocytic nevi of right lower limb, including hip: Secondary | ICD-10-CM | POA: Diagnosis not present

## 2021-09-18 DIAGNOSIS — L111 Transient acantholytic dermatosis [Grover]: Secondary | ICD-10-CM | POA: Diagnosis not present

## 2021-09-18 DIAGNOSIS — D2262 Melanocytic nevi of left upper limb, including shoulder: Secondary | ICD-10-CM | POA: Diagnosis not present

## 2021-09-18 DIAGNOSIS — D485 Neoplasm of uncertain behavior of skin: Secondary | ICD-10-CM | POA: Diagnosis not present

## 2021-09-18 DIAGNOSIS — L308 Other specified dermatitis: Secondary | ICD-10-CM | POA: Diagnosis not present

## 2021-09-18 DIAGNOSIS — L570 Actinic keratosis: Secondary | ICD-10-CM | POA: Diagnosis not present

## 2021-09-18 DIAGNOSIS — L578 Other skin changes due to chronic exposure to nonionizing radiation: Secondary | ICD-10-CM | POA: Diagnosis not present

## 2021-09-18 DIAGNOSIS — D225 Melanocytic nevi of trunk: Secondary | ICD-10-CM | POA: Diagnosis not present

## 2021-09-18 DIAGNOSIS — Z85828 Personal history of other malignant neoplasm of skin: Secondary | ICD-10-CM | POA: Diagnosis not present

## 2021-10-04 ENCOUNTER — Ambulatory Visit (INDEPENDENT_AMBULATORY_CARE_PROVIDER_SITE_OTHER): Payer: Medicare Other

## 2021-10-04 DIAGNOSIS — I495 Sick sinus syndrome: Secondary | ICD-10-CM | POA: Diagnosis not present

## 2021-10-05 LAB — CUP PACEART REMOTE DEVICE CHECK
Battery Remaining Longevity: 18 mo
Battery Remaining Percentage: 24 %
Brady Statistic RA Percent Paced: 0 %
Brady Statistic RV Percent Paced: 56 %
Date Time Interrogation Session: 20230302144300
Implantable Lead Implant Date: 20140421
Implantable Lead Implant Date: 20140421
Implantable Lead Location: 753859
Implantable Lead Location: 753860
Implantable Lead Model: 5076
Implantable Lead Model: 5076
Implantable Pulse Generator Implant Date: 20140421
Lead Channel Impedance Value: 654 Ohm
Lead Channel Impedance Value: 744 Ohm
Lead Channel Pacing Threshold Amplitude: 0.4 V
Lead Channel Pacing Threshold Amplitude: 0.8 V
Lead Channel Pacing Threshold Pulse Width: 0.4 ms
Lead Channel Pacing Threshold Pulse Width: 0.4 ms
Lead Channel Setting Pacing Amplitude: 1.4 V
Lead Channel Setting Pacing Amplitude: 2.5 V
Lead Channel Setting Pacing Pulse Width: 0.4 ms
Lead Channel Setting Sensing Sensitivity: 2.5 mV
Pulse Gen Serial Number: 112305

## 2021-10-11 NOTE — Progress Notes (Signed)
Remote pacemaker transmission.   

## 2021-10-15 ENCOUNTER — Encounter: Payer: Self-pay | Admitting: Internal Medicine

## 2021-10-15 ENCOUNTER — Ambulatory Visit (INDEPENDENT_AMBULATORY_CARE_PROVIDER_SITE_OTHER): Payer: Medicare Other | Admitting: Internal Medicine

## 2021-10-15 ENCOUNTER — Other Ambulatory Visit: Payer: Self-pay

## 2021-10-15 VITALS — BP 124/70 | HR 71 | Ht 71.5 in | Wt 207.6 lb

## 2021-10-15 DIAGNOSIS — Z950 Presence of cardiac pacemaker: Secondary | ICD-10-CM | POA: Diagnosis not present

## 2021-10-15 DIAGNOSIS — R0609 Other forms of dyspnea: Secondary | ICD-10-CM | POA: Diagnosis not present

## 2021-10-15 DIAGNOSIS — I5032 Chronic diastolic (congestive) heart failure: Secondary | ICD-10-CM

## 2021-10-15 DIAGNOSIS — Z01812 Encounter for preprocedural laboratory examination: Secondary | ICD-10-CM | POA: Diagnosis not present

## 2021-10-15 DIAGNOSIS — I2510 Atherosclerotic heart disease of native coronary artery without angina pectoris: Secondary | ICD-10-CM | POA: Diagnosis not present

## 2021-10-15 DIAGNOSIS — I495 Sick sinus syndrome: Secondary | ICD-10-CM | POA: Diagnosis not present

## 2021-10-15 DIAGNOSIS — I4821 Permanent atrial fibrillation: Secondary | ICD-10-CM | POA: Diagnosis not present

## 2021-10-15 MED ORDER — EMPAGLIFLOZIN 10 MG PO TABS: 10.0000 mg | ORAL_TABLET | Freq: Every day | ORAL | 3 refills | Status: DC

## 2021-10-15 NOTE — Patient Instructions (Addendum)
Medication Instructions:  ?Your physician has recommended you make the following change in your medication:  ? ?** Begin Jardiance '10mg'$  - 1 tablet by mouth before breakfast. ? ?*If you need a refill on your cardiac medications before your next appointment, please call your pharmacy* ? ? ?Lab Work: ?BMET and CBC today ? ?If you have labs (blood work) drawn today and your tests are completely normal, you will receive your results only by: ?MyChart Message (if you have MyChart) OR ?A paper copy in the mail ?If you have any lab test that is abnormal or we need to change your treatment, we will call you to review the results. ? ? ?Testing/Procedures: ?Your physician has requested that you have a lexiscan myoview. For further information please visit [email or web address]. Please follow instruction sheet, as given.  ? ? ?Follow-Up: ?At [facility], you and your health needs are our priority.  As part of our continuing mission to provide you with exceptional heart care, we have created designated Provider Care Teams.  These Care Teams include your primary Cardiologist (physician) and Advanced Practice Providers (APPs -  Physician Assistants and Nurse Practitioners) who all work together to provide you with the care you need, when you need it. ? ?We recommend signing up for the patient portal called "MyChart".  Sign up information is provided on this After Visit Summary.  MyChart is used to connect with patients for Virtual Visits (Telemedicine).  Patients are able to view lab/test results, encounter notes, upcoming appointments, etc.  Non-urgent messages can be sent to your provider as well.   ?To learn more about what you can do with MyChart, go to [email or web address].   ? ?Your next appointment:   ?12 months with Dr Caryl Comes :1}  ? ?

## 2021-10-15 NOTE — Progress Notes (Signed)
? ? ? ? ?Patient Care Team: ?[name], MD as PCP - General (Family Medicine) ?Deboraha Sprang, MD as PCP - Cardiology (Cardiology) ? ? ?HPI ? ?[name] is a 76 y.o. male ?Seen in followup for atrial fibrillation for which he took tikosyn, underwent AFib ablation Greggory Brandy- 12/19) and DCCV 3/20 but failed to hold sinus and with severe R atriopathy >>permanent atrial fibrillation  [employer] pacemaker (DOI 4/14). ? ?Coronary artery disease catheterization 2006 demonstrated moderate two-vessel disease with a 50% LAD-mid to distal and a 60% stenosis in the OM ? ?anticoagulation with Rivaroxaban  ? ?The patient denies chest pain, nocturnal dyspnea, orthopnea .  There have been no palpitations, lightheadedness or syncope.  Complains of dyspnea on exertion.  Does not seem to change with his diuretic.  Seems to be worsening.  Also of late has noted fatigue in his thighs when he walks.  Relieved with rest and then can resume walking..  ?  ? ?  ? ?  ?Date Cr K Hgb LDL  ?2/17  1.08 3.7     ?8/17  0.93 4.1     ?9/18 0.91 4.9 16.0   ?3/20  0.81 3.4 13.9   ?6/20 1.0 4.3 16.6   ?1/21 0.89 3.8 15.7   ?      ?  ?  ?DATE TEST  EF    ?4/13 Echo   55-65 %    ?3/14    Myoview   Images Personally reviewed  Normal   ? 11/19  Echo   LAE (45/2.05/26)   ?12/19 TEE 60-65% Severe R Atriopathy  ?8/21 Echo 55-60%    ?7/21 CTA   CA calcifications ?But not gated study ordered by mistake  ?  ? Thromboembolic risk factors ( age  -2, HTN-1, Vasc disease -1) for a CHADSVASc Score of 4 ? ?  ? ?Past Medical History:  ?Diagnosis Date  ? 1st degree AV block   ? Atrial flutter [facility])   ? s/p ablation 2006  ? CAD (coronary artery disease)   ? Mild 2 vessel disease per cath in 2006; myoview neg 2011  ? Chronic anticoagulation   ? on Xarelto - started 11/12/11  ? Depression 11-05-11  ? On Cymbalta  ? GERD (gastroesophageal reflux disease)   ? Hyperlipidemia   ? Hypertension   ? Major depression single episode, in partial remission ([place]) 12/18/2020  ?  Normal nuclear stress test 2011  ? Persistent atrial fibrillation ([place])   ? recent new occurrence 3/13>>Tikosyn  ? PVC's (premature ventricular contractions)   ? Right ventricular enlargement   ? Mild noted echo 3/13  ? Sick sinus syndrome ([place]) 11/23/2012  ? Dr Caryl Comes  ? Tobacco abuse   ? ? ?Past Surgical History:  ?Procedure Laterality Date  ? ABLATION OF DYSRHYTHMIC FOCUS  07/07/2018  ? ATRIAL FIBRILLATION ABLATION N/A 07/07/2018  ? Procedure: ATRIAL FIBRILLATION ABLATION;  Surgeon: [name], MD;  Location: [facility] CV LAB;  Service: Cardiovascular;  Laterality: N/A;  ? CARDIAC CATHETERIZATION  11/1988, 12/07/2004  ? showing essentially normal left  ventricular function, moderate two-vessel disease (50% LAD after the third diagonal and a 60+% stenosis in the second obtuse marginal)         ? CARDIOVERSION N/A 12/05/2011  ? Procedure: CARDIOVERSION;  Surgeon: Deboraha Sprang, MD;  Location[facility] CATH LAB;  Service: Cardiovascular;  Laterality: N/A;  ? CARDIOVERSION N/A 08/07/2018  ? Procedure: CARDIOVERSION;  Surgeon: Lelon Perla, MD;  Location: [facility]  ENDOSCOPY;  Service: Cardiovascular;  Laterality: N/A;  ? CARDIOVERSION N/A 10/09/2018  ? Procedure: CARDIOVERSION;  Surgeon: Pixie Casino, MD;  Location: [facility];  Service: Cardiovascular;  Laterality: N/A;  ? [place]  ? left-retained hardware  ? HERNIA REPAIR    ? INGUINAL HERNIA REPAIR  01/28/2008  ? Recurrent right inguinal hernia Laparoscopic, preperitoneal repair of recurrent right inguinal hernia with mesh -- SURGEON:  Dr. Fanny Skates.  ? [place]  11/06/2011  ? Procedure: HERNIA REPAIR INGUINAL ADULT;  Surgeon: Adin Hector, MD;  Location: [facility] ORS;  Service: General;  Laterality: Left;  ? PACEMAKER INSERTION  11/23/2012  ? Dr Caryl Comes  ? PERMANENT PACEMAKER INSERTION N/A 11/23/2012  ? Procedure: PERMANENT PACEMAKER INSERTION;  Surgeon: Deboraha Sprang, MD;  Location: [facility] CATH LAB;  Service: Cardiovascular;  Laterality: N/A;  ?  TEE WITH CARDIOVERSION  2001, 2006  ? Invasive electrophysiology study, electroanatomical mapping,and radiofrequency catheter ablation. -- patient's arrhythmia was isthmus dependent flutter not withstanding the very long cycle length.  RF energy delivered across the cavotricuspid isthmus successfully interrupted conduction across the isthmus and eliminated the patient's sub-straight. PHYSICIAN:  Deboraha Sprang, M.D  ? TEE WITHOUT CARDIOVERSION  12/31/2011  ? Procedure: TRANSESOPHAGEAL ECHOCARDIOGRAM (TEE);  Surgeon: Josue Hector, MD;  Location: [place];  Service: Cardiovascular;  Laterality: N/A;  tba after for tikosyn  ? TEE WITHOUT CARDIOVERSION N/A 07/07/2018  ? Procedure: TRANSESOPHAGEAL ECHOCARDIOGRAM (TEE);  Surgeon: Acie Fredrickson Wonda Cheng, MD;  Location: [place];  Service: Cardiovascular;  Laterality: N/A;  ? VASECTOMY    ? ? ?Current Outpatient Medications  ?Medication Sig Dispense Refill  ? diphenhydrAMINE (BENADRYL) 25 MG tablet Take 50 mg by mouth at bedtime.    ? fluticasone (FLONASE) 50 MCG/ACT nasal spray Place 1-2 sprays into both nostrils at bedtime.     ? furosemide (LASIX) 40 MG tablet TAKE 3 TABLETS DAILY 270 tablet 3  ? metolazone (ZAROXOLYN) 2.5 MG tablet TAKE 1 TABLET AS NEEDED FOR EDEMA 90 tablet 3  ? rivaroxaban (XARELTO) 20 MG TABS tablet TAKE 1 TABLET DAILY WITH SUPPER 90 tablet 1  ? VIAGRA 100 MG tablet Take 100 mg by mouth as needed for erectile dysfunction.     ? ?No current facility-administered medications for this visit.  ? ? ?Allergies  ?Allergen Reactions  ? Duloxetine Hcl   ?  Other reaction(s): nausea, diarrhea  ? Horse-Derived Products Hives  ? Lexapro [Escitalopram] Other (See Comments)  ? Other   ?  Other reaction(s): jaw clenching  ? ? ?Review of Systems negative except from HPI and PMH ? ?Physical Exam ?BP 124/70   Pulse 71   Ht 5' 11.5" (1.816 m)   Wt 207 lb 9.6 oz (94.2 kg)   SpO2 97%   BMI 28.55 kg/m?  ?Well developed and well nourished in no acute distress ?HENT  normal ?Neck supple with JVP-flat ?Clear ?Device pocket well healed; without hematoma or erythema.  There is no tethering  ?Irregular rate and rhythm, no  gallop No murmur ?Abd-soft with active BS ?No Clubbing cyanosis tr edema ?Skin-warm and dry ?A & Oriented  Grossly normal sensory and motor function ? ?ECG atrial fibrillation at 71 ?Interval-/16/45 ?Right bundle branch block left anterior fascicular block ? ? ? ?ECG demonstrates afib Vpacing 60 with some intrinsic conduction   ?Assessment and  Plan ? ?Coronary artery disease-cath 2006 moderate nonobstructive ? ?Atrial  Fibrillation-permanent ? ?Bifascicular block ? ?Pacemaker  BSx ? ?Hypertension ? ?  HFpEF ? ?Ventricular pacing > 50%  ? ? ?Atrial fibrillation is permanent with 55% ventricular pacing.  We decreased ventricular pacing from 60-50 with only a small change in ventricular pacing percentage.  We will need to reassess left ventricular function  ? ?Increasing exercise intolerance, accompanied by fatigue in his thighs.  Question ischemic.  Has known nonobstructive coronary disease about 15 years ago.  Not a candidate for CTA with atrial fibrillation.  We will undertake Myoview scanning with stress ? ?We have discussed referral to heart failure clinic; in the interim, we will begin him on Jardiance 10. ? ?He will continue Lasix for 120 and Zaroxolyn once a week. ? ?On rivaroxaban without bleeding continue 20 mg a day ? ? ?  ? ?

## 2021-10-16 LAB — BASIC METABOLIC PANEL
BUN/Creatinine Ratio: 15 (ref 10–24)
BUN: 16 mg/dL (ref 8–27)
CO2: 30 mmol/L — ABNORMAL HIGH (ref 20–29)
Calcium: 10.1 mg/dL (ref 8.6–10.2)
Chloride: 94 mmol/L — ABNORMAL LOW (ref 96–106)
Creatinine, Ser: 1.04 mg/dL (ref 0.76–1.27)
Glucose: 106 mg/dL — ABNORMAL HIGH (ref 70–99)
Potassium: 3.4 mmol/L — ABNORMAL LOW (ref 3.5–5.2)
Sodium: 142 mmol/L (ref 134–144)
eGFR: 74 mL/min/{1.73_m2} (ref >59)

## 2021-10-16 LAB — CBC
Hematocrit: 48 % (ref 37.5–51.0)
Hemoglobin: 17.2 g/dL (ref 13.0–17.7)
MCH: 34.2 pg — ABNORMAL HIGH (ref 26.6–33.0)
MCHC: 35.8 g/dL — ABNORMAL HIGH (ref 31.5–35.7)
MCV: 95 fL (ref 79–97)
Platelets: 253 10*3/uL (ref 150–450)
RBC: 5.03 x10E6/uL (ref 4.14–5.80)
RDW: 12.2 % (ref 11.6–15.4)
WBC: 6.9 10*3/uL (ref 3.4–10.8)

## 2021-10-16 NOTE — Addendum Note (Signed)
Addended by: Deboraha Sprang on: 10/16/2021 06:16 PM ? ? Modules accepted: Orders ? ?

## 2021-10-16 NOTE — Addendum Note (Signed)
Addended by: Thora Lance on: 10/16/2021 06:16 PM ? ? Modules accepted: Orders ? ?

## 2021-10-22 ENCOUNTER — Telehealth (HOSPITAL_COMMUNITY): Payer: Self-pay | Admitting: *Deleted

## 2021-10-22 NOTE — Telephone Encounter (Signed)
Left message on voicemail per DPR in reference to upcoming appointment scheduled on 10/29/2021 at 7:45 with detailed instructions given per Myocardial Perfusion Study Information Sheet for the test. LM to arrive 15 minutes early, and that it is imperative to arrive on time for appointment to keep from having the test rescheduled. If you need to cancel or reschedule your appointment, please call the office within 24 hours of your appointment. Failure to do so may result in a cancellation of your appointment, and a $50 no show fee. Phone number given for call back for any questions.  ? ?

## 2021-10-23 ENCOUNTER — Telehealth: Payer: Self-pay

## 2021-10-23 DIAGNOSIS — E876 Hypokalemia: Secondary | ICD-10-CM

## 2021-10-23 NOTE — Telephone Encounter (Signed)
Spoke with pt and advised of lab results per Dr Caryl Comes as below. ? ?Deboraha Sprang, MD  ?10/16/2021  6:52 PM EDT Back to Top ? ?Please Inform Patient that labs are normal x low potassium-- ijncrease K rich foods and we should recheck in 2 weeks  ? ?Pt verbalizes understanding and lab appointment scheduled for 11/14/2021 to repeat BMET. ?

## 2021-10-23 NOTE — Telephone Encounter (Signed)
-----   Message from Deboraha Sprang, MD sent at 10/16/2021  6:52 PM EDT ----- ?Please Inform Patient that labs are normal x low potassium-- ijncrease K rich foods and we should recheck in 2 weeks  ?Thanks ? ?

## 2021-10-29 ENCOUNTER — Other Ambulatory Visit: Payer: Self-pay

## 2021-10-29 ENCOUNTER — Ambulatory Visit (HOSPITAL_COMMUNITY): Payer: Medicare Other | Attending: Cardiology

## 2021-10-29 DIAGNOSIS — R0609 Other forms of dyspnea: Secondary | ICD-10-CM | POA: Insufficient documentation

## 2021-10-29 DIAGNOSIS — I2510 Atherosclerotic heart disease of native coronary artery without angina pectoris: Secondary | ICD-10-CM | POA: Diagnosis not present

## 2021-10-29 LAB — MYOCARDIAL PERFUSION IMAGING
LV dias vol: 117 mL (ref 62–150)
LV sys vol: 37 mL
Nuc Stress EF: 68 %
Peak HR: 68 {beats}/min
Rest HR: 62 {beats}/min
Rest Nuclear Isotope Dose: 10.1 mCi
SDS: 0
SRS: 4
SSS: 4
ST Depression (mm): 0 mm
Stress Nuclear Isotope Dose: 31.2 mCi
TID: 1

## 2021-10-29 MED ORDER — TECHNETIUM TC 99M TETROFOSMIN IV KIT
31.2000 | PACK | Freq: Once | INTRAVENOUS | Status: AC | PRN
  Administered 2021-10-29: 31.2 via INTRAVENOUS
  Filled 2021-10-29: qty 32

## 2021-10-29 MED ORDER — TECHNETIUM TC 99M TETROFOSMIN IV KIT
10.1000 | PACK | Freq: Once | INTRAVENOUS | Status: AC | PRN
  Administered 2021-10-29: 10.1 via INTRAVENOUS
  Filled 2021-10-29: qty 11

## 2021-10-29 MED ORDER — REGADENOSON 0.4 MG/5ML IV SOLN
0.4000 mg | Freq: Once | INTRAVENOUS | Status: AC
  Administered 2021-10-29: 0.4 mg via INTRAVENOUS

## 2021-11-14 ENCOUNTER — Other Ambulatory Visit: Payer: Medicare Other | Admitting: *Deleted

## 2021-11-14 DIAGNOSIS — E876 Hypokalemia: Secondary | ICD-10-CM | POA: Diagnosis not present

## 2021-11-15 LAB — BASIC METABOLIC PANEL
BUN/Creatinine Ratio: 17 (ref 10–24)
BUN: 18 mg/dL (ref 8–27)
CO2: 31 mmol/L — ABNORMAL HIGH (ref 20–29)
Calcium: 9.8 mg/dL (ref 8.6–10.2)
Chloride: 95 mmol/L — ABNORMAL LOW (ref 96–106)
Creatinine, Ser: 1.08 mg/dL (ref 0.76–1.27)
Glucose: 122 mg/dL — ABNORMAL HIGH (ref 70–99)
Potassium: 3.8 mmol/L (ref 3.5–5.2)
Sodium: 143 mmol/L (ref 134–144)
eGFR: 71 mL/min/{1.73_m2} (ref >59)

## 2021-11-19 DIAGNOSIS — R7303 Prediabetes: Secondary | ICD-10-CM | POA: Diagnosis not present

## 2021-11-19 DIAGNOSIS — I4891 Unspecified atrial fibrillation: Secondary | ICD-10-CM | POA: Diagnosis not present

## 2021-11-19 DIAGNOSIS — I509 Heart failure, unspecified: Secondary | ICD-10-CM | POA: Diagnosis not present

## 2021-11-19 DIAGNOSIS — I739 Peripheral vascular disease, unspecified: Secondary | ICD-10-CM | POA: Diagnosis not present

## 2021-11-19 DIAGNOSIS — M720 Palmar fascial fibromatosis [Dupuytren]: Secondary | ICD-10-CM | POA: Diagnosis not present

## 2021-11-19 DIAGNOSIS — Z1389 Encounter for screening for other disorder: Secondary | ICD-10-CM | POA: Diagnosis not present

## 2021-11-19 DIAGNOSIS — E7800 Pure hypercholesterolemia, unspecified: Secondary | ICD-10-CM | POA: Diagnosis not present

## 2021-11-19 DIAGNOSIS — Z0000 Encounter for general adult medical examination without abnormal findings: Secondary | ICD-10-CM | POA: Diagnosis not present

## 2021-11-19 DIAGNOSIS — R030 Elevated blood-pressure reading, without diagnosis of hypertension: Secondary | ICD-10-CM | POA: Diagnosis not present

## 2021-11-19 DIAGNOSIS — L111 Transient acantholytic dermatosis [Grover]: Secondary | ICD-10-CM | POA: Diagnosis not present

## 2021-11-19 DIAGNOSIS — N529 Male erectile dysfunction, unspecified: Secondary | ICD-10-CM | POA: Diagnosis not present

## 2021-11-19 DIAGNOSIS — K9289 Other specified diseases of the digestive system: Secondary | ICD-10-CM | POA: Diagnosis not present

## 2021-11-19 DIAGNOSIS — J309 Allergic rhinitis, unspecified: Secondary | ICD-10-CM | POA: Diagnosis not present

## 2021-11-20 ENCOUNTER — Other Ambulatory Visit: Payer: Self-pay | Admitting: Family Medicine

## 2021-11-20 DIAGNOSIS — I739 Peripheral vascular disease, unspecified: Secondary | ICD-10-CM

## 2021-11-21 ENCOUNTER — Other Ambulatory Visit: Payer: Medicare Other

## 2021-11-23 ENCOUNTER — Ambulatory Visit
Admission: RE | Admit: 2021-11-23 | Discharge: 2021-11-23 | Disposition: A | Discharge status: Home / Self Care | Payer: Medicare Other | Source: Ambulatory Visit | Attending: Family Medicine | Admitting: Family Medicine

## 2021-11-23 DIAGNOSIS — I70213 Atherosclerosis of native arteries of extremities with intermittent claudication, bilateral legs: Secondary | ICD-10-CM | POA: Diagnosis not present

## 2021-11-23 DIAGNOSIS — I739 Peripheral vascular disease, unspecified: Secondary | ICD-10-CM

## 2021-12-12 DIAGNOSIS — L570 Actinic keratosis: Secondary | ICD-10-CM | POA: Diagnosis not present

## 2021-12-12 DIAGNOSIS — D485 Neoplasm of uncertain behavior of skin: Secondary | ICD-10-CM | POA: Diagnosis not present

## 2021-12-12 DIAGNOSIS — L851 Acquired keratosis [keratoderma] palmaris et plantaris: Secondary | ICD-10-CM | POA: Diagnosis not present

## 2021-12-12 DIAGNOSIS — L820 Inflamed seborrheic keratosis: Secondary | ICD-10-CM | POA: Diagnosis not present

## 2021-12-12 DIAGNOSIS — L821 Other seborrheic keratosis: Secondary | ICD-10-CM | POA: Diagnosis not present

## 2021-12-31 ENCOUNTER — Other Ambulatory Visit: Payer: Self-pay | Admitting: Internal Medicine

## 2022-01-01 NOTE — Telephone Encounter (Signed)
Age 76, weight 207 lbs, SCr 1.08 on 11/2021, crcl 76 Afib indication, last OV 10/2021

## 2022-01-03 ENCOUNTER — Ambulatory Visit (INDEPENDENT_AMBULATORY_CARE_PROVIDER_SITE_OTHER): Payer: Medicare Other

## 2022-01-03 DIAGNOSIS — I495 Sick sinus syndrome: Secondary | ICD-10-CM

## 2022-01-04 LAB — CUP PACEART REMOTE DEVICE CHECK
Battery Remaining Longevity: 18 mo
Battery Remaining Percentage: 18 %
Brady Statistic RA Percent Paced: 0 %
Brady Statistic RV Percent Paced: 69 %
Date Time Interrogation Session: 20230601073100
Implantable Lead Implant Date: 20140421
Implantable Lead Implant Date: 20140421
Implantable Lead Location: 753859
Implantable Lead Location: 753860
Implantable Lead Model: 5076
Implantable Lead Model: 5076
Implantable Pulse Generator Implant Date: 20140421
Lead Channel Impedance Value: 586 Ohm
Lead Channel Impedance Value: 735 Ohm
Lead Channel Pacing Threshold Amplitude: 0.4 V
Lead Channel Pacing Threshold Amplitude: 0.9 V
Lead Channel Pacing Threshold Pulse Width: 0.4 ms
Lead Channel Pacing Threshold Pulse Width: 0.4 ms
Lead Channel Setting Pacing Amplitude: 1.4 V
Lead Channel Setting Pacing Amplitude: 2.5 V
Lead Channel Setting Pacing Pulse Width: 0.4 ms
Lead Channel Setting Sensing Sensitivity: 2.5 mV
Pulse Gen Serial Number: 112305

## 2022-01-11 NOTE — Progress Notes (Signed)
Remote pacemaker transmission.   

## 2022-03-28 ENCOUNTER — Other Ambulatory Visit: Payer: Self-pay | Admitting: Internal Medicine

## 2022-04-02 DIAGNOSIS — D2262 Melanocytic nevi of left upper limb, including shoulder: Secondary | ICD-10-CM | POA: Diagnosis not present

## 2022-04-02 DIAGNOSIS — L570 Actinic keratosis: Secondary | ICD-10-CM | POA: Diagnosis not present

## 2022-04-02 DIAGNOSIS — D0461 Carcinoma in situ of skin of right upper limb, including shoulder: Secondary | ICD-10-CM | POA: Diagnosis not present

## 2022-04-02 DIAGNOSIS — D2271 Melanocytic nevi of right lower limb, including hip: Secondary | ICD-10-CM | POA: Diagnosis not present

## 2022-04-02 DIAGNOSIS — D485 Neoplasm of uncertain behavior of skin: Secondary | ICD-10-CM | POA: Diagnosis not present

## 2022-04-02 DIAGNOSIS — D225 Melanocytic nevi of trunk: Secondary | ICD-10-CM | POA: Diagnosis not present

## 2022-04-02 DIAGNOSIS — L578 Other skin changes due to chronic exposure to nonionizing radiation: Secondary | ICD-10-CM | POA: Diagnosis not present

## 2022-04-02 DIAGNOSIS — L821 Other seborrheic keratosis: Secondary | ICD-10-CM | POA: Diagnosis not present

## 2022-04-02 DIAGNOSIS — Z85828 Personal history of other malignant neoplasm of skin: Secondary | ICD-10-CM | POA: Diagnosis not present

## 2022-04-04 ENCOUNTER — Ambulatory Visit (INDEPENDENT_AMBULATORY_CARE_PROVIDER_SITE_OTHER): Payer: Medicare Other

## 2022-04-04 DIAGNOSIS — I495 Sick sinus syndrome: Secondary | ICD-10-CM | POA: Diagnosis not present

## 2022-04-04 LAB — CUP PACEART REMOTE DEVICE CHECK
Battery Remaining Longevity: 10 mo
Battery Remaining Percentage: 11 %
Brady Statistic RA Percent Paced: 0 %
Brady Statistic RV Percent Paced: 71 %
Date Time Interrogation Session: 20230831113200
Implantable Lead Implant Date: 20140421
Implantable Lead Implant Date: 20140421
Implantable Lead Location: 753859
Implantable Lead Location: 753860
Implantable Lead Model: 5076
Implantable Lead Model: 5076
Implantable Pulse Generator Implant Date: 20140421
Lead Channel Impedance Value: 610 Ohm
Lead Channel Impedance Value: 747 Ohm
Lead Channel Pacing Threshold Amplitude: 0.4 V
Lead Channel Pacing Threshold Amplitude: 1 V
Lead Channel Pacing Threshold Pulse Width: 0.4 ms
Lead Channel Pacing Threshold Pulse Width: 0.4 ms
Lead Channel Setting Pacing Amplitude: 1.5 V
Lead Channel Setting Pacing Amplitude: 2.5 V
Lead Channel Setting Pacing Pulse Width: 0.4 ms
Lead Channel Setting Sensing Sensitivity: 2.5 mV
Pulse Gen Serial Number: 112305

## 2022-04-25 NOTE — Progress Notes (Signed)
Remote pacemaker transmission.   

## 2022-05-14 DIAGNOSIS — D0461 Carcinoma in situ of skin of right upper limb, including shoulder: Secondary | ICD-10-CM | POA: Diagnosis not present

## 2022-05-27 ENCOUNTER — Telehealth: Payer: Self-pay

## 2022-05-27 NOTE — Telephone Encounter (Signed)
Device alert, device was switched to safety mode 10/21

## 2022-05-27 NOTE — Telephone Encounter (Signed)
Outreach made to Pt.  Advised his PPM had switched into safety mode and he would need an appt with Dr. Caryl Comes to discuss gen change.  Pt appt made for June 05, 2022 at 8:45 am to discuss.  Pt states he feels ok, but does notice a pulsating at night when he sleeps on his left side.  Advised that is because of the high output mode.  Pt thanked nurse for call.

## 2022-06-04 DIAGNOSIS — I452 Bifascicular block: Secondary | ICD-10-CM | POA: Insufficient documentation

## 2022-06-05 ENCOUNTER — Encounter: Payer: Self-pay | Admitting: Internal Medicine

## 2022-06-05 ENCOUNTER — Ambulatory Visit: Payer: Medicare Other | Attending: Internal Medicine | Admitting: Internal Medicine

## 2022-06-05 VITALS — BP 138/76 | HR 73 | Ht 71.5 in | Wt 204.0 lb

## 2022-06-05 DIAGNOSIS — I5032 Chronic diastolic (congestive) heart failure: Secondary | ICD-10-CM | POA: Diagnosis not present

## 2022-06-05 DIAGNOSIS — Z01812 Encounter for preprocedural laboratory examination: Secondary | ICD-10-CM | POA: Diagnosis not present

## 2022-06-05 DIAGNOSIS — I452 Bifascicular block: Secondary | ICD-10-CM | POA: Insufficient documentation

## 2022-06-05 DIAGNOSIS — I4821 Permanent atrial fibrillation: Secondary | ICD-10-CM | POA: Insufficient documentation

## 2022-06-05 DIAGNOSIS — Z79899 Other long term (current) drug therapy: Secondary | ICD-10-CM | POA: Insufficient documentation

## 2022-06-05 DIAGNOSIS — Z950 Presence of cardiac pacemaker: Secondary | ICD-10-CM | POA: Insufficient documentation

## 2022-06-05 DIAGNOSIS — I2510 Atherosclerotic heart disease of native coronary artery without angina pectoris: Secondary | ICD-10-CM | POA: Diagnosis not present

## 2022-06-05 MED ORDER — METOLAZONE 2.5 MG PO TABS: 2.5000 mg | ORAL_TABLET | Freq: Two times a day (BID) | ORAL | 3 refills | Status: DC

## 2022-06-05 MED ORDER — METOLAZONE 2.5 MG PO TABS: 2.5000 mg | ORAL_TABLET | ORAL | 3 refills | Status: DC

## 2022-06-05 NOTE — Progress Notes (Signed)
Patient Care Team: [name], MD as PCP - General (Family Medicine) Deboraha Sprang, MD as PCP - Cardiology (Cardiology)   HPI  [name] is a 76 y.o. male Seen in followup for atrial fibrillation for which he took [place], underwent AFib ablation Greggory Brandy- 12/19) and DCCV 3/20 but failed to hold sinus and with severe R atriopathy >>permanent atrial fibrillation  [employer] pacemaker (DOI 4/14).  Coronary artery disease catheterization 2006 demonstrated moderate two-vessel disease with a 50% LAD-mid to distal and a 60% stenosis in the OM  anticoagulation with Rivaroxaban without bleeding this is exudate on telemetry there does not really MADIT so he was prepped The patient denies chest pain, nocturnal dyspnea, orthopnea.  There have been no palpitations, lightheadedness or syncope.  Complains of fatigue, and peripheral edema.  Diet is replete of fluid.  Acknowledges at least a bottle of wine a night.  Salt is actually replete not being able also.     Date Cr K Hgb LDL  2/17  1.08 3.7     8/17  0.93 4.1     9/18 0.91 4.9 16.0   3/20  0.81 3.4 13.9   6/20 1.0 4.3 16.6   1/21 0.89 3.8 15.7   4/23 1.08 3.8 17.2       DATE TEST  EF    4/13 Echo   55-65 %    3/14    Myoview   Images Personally reviewed  Normal    11/19  Echo   LAE (45/2.05/26)   12/19 TEE 60-65% Severe R Atriopathy  8/21 Echo 55-60%    7/21 CTA   CA calcifications But not gated study ordered by mistake  3/23 Myoview  68% No ischemia       Thromboembolic risk factors ( age  -2, HTN-1, Vasc disease -1) for a CHADSVASc Score of 4     Past Medical History:  Diagnosis Date   1st degree AV block    Atrial flutter ([place])    s/p ablation 2006   CAD (coronary artery disease)    Mild 2 vessel disease per cath in 2006; myoview neg 2011   Chronic anticoagulation    on Xarelto - started 11/12/11   Depression 11-05-11   On Cymbalta   GERD (gastroesophageal reflux disease)    Hyperlipidemia     Hypertension    Major depression single episode, 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   Normal nuclear stress test 2011   Persistent atrial fibrillation ([place])    recent new occurrence 3/13>>Tikosyn   PVC's (premature ventricular contractions)    Right ventricular enlargement    Mild noted echo 3/13   Sick sinus syndrome ([place]) 11/23/2012   Dr Caryl Comes   Tobacco abuse     Past Surgical History:  Procedure Laterality Date   ABLATION OF DYSRHYTHMIC FOCUS  07/07/2018   ATRIAL FIBRILLATION ABLATION N/A 07/07/2018   Procedure: ATRIAL FIBRILLATION ABLATION;  Surgeon: [name], MD;  Location: [place] [facility];  Service: Cardiovascular;  Laterality: N/A;   CARDIAC CATHETERIZATION  11/1988, 12/07/2004   showing essentially normal left  ventricular function, moderate two-vessel disease (50% LAD after the third diagonal and a 60+% stenosis in the second obtuse marginal)          CARDIOVERSION N/A 12/05/2011   Procedure: CARDIOVERSION;  Surgeon: Deboraha Sprang, MD;  Location: [facility] CATH LAB;  Service: Cardiovascular;  Laterality: N/A;   CARDIOVERSION N/A 08/07/2018   Procedure: CARDIOVERSION;  Surgeon:  Lelon Perla, MD;  Location: [facility] ENDOSCOPY;  Service: Cardiovascular;  Laterality: N/A;   CARDIOVERSION N/A 10/09/2018   Procedure: CARDIOVERSION;  Surgeon: Pixie Casino, MD;  Location: [place];  Service: Cardiovascular;  Laterality: N/A;   [place]   left-retained hardware   [place]  01/28/2008   Recurrent right inguinal hernia Laparoscopic, preperitoneal repair of recurrent right inguinal hernia with mesh -- SURGEON:  Dr. Fanny Skates.   INGUINAL HERNIA REPAIR  11/06/2011   Procedure: HERNIA REPAIR INGUINAL ADULT;  Surgeon: Adin Hector, MD;  Location: [facility] OR[facility];  Service: General;  Laterality: Left;   PACEMAKER INSERTION  11/23/2012   Dr Caryl Comes   PERMANENT PACEMAKER INSERTION N/A 11/23/2012   Procedure: PERMANENT PACEMAKER INSERTION;  Surgeon:  Deboraha Sprang, MD;  Location: [facility] CATH LAB;  Service: Cardiovascular;  Laterality: N/A;   TEE WITH CARDIOVERSION  2001, 2006   Invasive electrophysiology study, electroanatomical mapping,and radiofrequency catheter ablation. -- patient's arrhythmia was isthmus dependent flutter not withstanding the very long cycle length.  RF energy delivered across the cavotricuspid isthmus successfully interrupted conduction across the isthmus and eliminated the patient's sub-straight. PHYSICIAN:  Deboraha Sprang, M.D   TEE WITHOUT CARDIOVERSION  12/31/2011   Procedure: TRANSESOPHAGEAL ECHOCARDIOGRAM (TEE);  Surgeon: Josue Hector, MD;  Location: [place];  Service: Cardiovascular;  Laterality: N/A;  tba after for tikosyn   TEE WITHOUT CARDIOVERSION N/A 07/07/2018   Procedure: TRANSESOPHAGEAL ECHOCARDIOGRAM (TEE);  Surgeon: Acie Fredrickson Wonda Cheng, MD;  Location: [facility] ENDOSCOPY;  Service: Cardiovascular;  Laterality: N/A;   VASECTOMY      Current Outpatient Medications  Medication Sig Dispense Refill   diphenhydrAMINE (BENADRYL) 25 MG tablet Take 50 mg by mouth at bedtime.     fluticasone (FLONASE) 50 MCG/ACT nasal spray Place 1-2 sprays into both nostrils at bedtime.      furosemide (LASIX) 40 MG tablet TAKE 3 TABLETS DAILY 270 tablet 2   metolazone (ZAROXOLYN) 2.5 MG tablet TAKE 1 TABLET AS NEEDED FOR EDEMA 90 tablet 3   rivaroxaban (XARELTO) 20 MG TABS tablet TAKE 1 TABLET DAILY WITH SUPPER 90 tablet 1   VIAGRA 100 MG tablet Take 100 mg by mouth as needed for erectile dysfunction.      No current facility-administered medications for this visit.    Allergies  Allergen Reactions   Duloxetine Hcl     Other reaction(s): nausea, diarrhea   Horse-Derived Products Hives   Lexapro [Escitalopram] Other (See Comments)   Other     Other reaction(s): jaw clenching    Review of Systems negative except from HPI and PMH  Physical Exam BP 138/76   Pulse 73   Ht 5' 11.5" (1.816 m)   Wt 204 lb (92.5 kg)   SpO2  96%   BMI 28.06 kg/m  Well developed and well nourished in no acute distress HENT normal Neck supple with JVP-flat Clear Device pocket well healed; without hematoma or erythema.  There is no tethering  Regular rate and rhythm, no  gallop No  murmur Abd-soft with active BS No Clubbing cyanosis 2+ edema Skin-warm and dry A & Oriented  Grossly normal sensory and motor function  ECG ventricular pacing at 72 with unipolar pacing spikes  Device function is abnormal.  Programming changes none as the device has reached an advisory and has not been programmable See Paceart for details   Assessment and  Plan  Coronary artery disease-cath 2006 moderate nonobstructive  Atrial  Fibrillation-permanent  Bifascicular block  Pacemaker  BSx now on advisory  Hypertension  HFpEF  Ventricular pacing > 50%   Polycythemia   Atrial fibrillation is permanent.  Continues Xarelto, no clinical bleeding.  Last hemoglobin was elevated.  We will recheck this.  Volume overloaded.  We will have him take his Zaroxolyn twice a week.  I will check his metabolic profile about 2 weeks as he has had some history of hypokalemia.  He has taken magnesium supplement with some Potassium in it.  His device has a default and now will need to be replaced.  He is not dependent.  He is having some pectoral stimulation from the unipolar pacing.  He is okay to wait until I return from [place] and we will undertake generator replacement in a couple of weeks.  We have discussed the risks including but not limited to infection.  He is polycythemic.  We will plan to recheck a CBC with report level.

## 2022-06-05 NOTE — Patient Instructions (Addendum)
Medication Instructions:  Your physician has recommended you make the following change in your medication:   Take Metalozone 2.'5mg'$  - 1 tablet by mouth twice weekly  *If you need a refill on your cardiac medications before your next appointment, please call your pharmacy*   Lab Work: CBC, BMET and Erythropoetin level in 2 weeks.  If you have labs (blood work) drawn today and your tests are completely normal, you will receive your results only by: [place] (if you have MyChart) OR A paper copy in the mail If you have any lab test that is abnormal or we need to change your treatment, we will call you to review the results.   Testing/Procedures: None ordered.    Follow-Up: At [facility], you and your health needs are our priority.  As part of our continuing mission to provide you with exceptional heart care, we have created designated Provider Care Teams.  These Care Teams include your primary Cardiologist (physician) and Advanced Practice Providers (APPs -  Physician Assistants and Nurse Practitioners) who all work together to provide you with the care you need, when you need it.  We recommend signing up for the patient portal called "MyChart".  Sign up information is provided on this After Visit Summary.  MyChart is used to connect with patients for Virtual Visits (Telemedicine).  Patients are able to view lab/test results, encounter notes, upcoming appointments, etc.  Non-urgent messages can be sent to your provider as well.   To learn more about what you can do with MyChart, go to [email or web address].    Your next appointment:   To be scheduled  Important Information About Sugar

## 2022-06-19 ENCOUNTER — Ambulatory Visit: Payer: Medicare Other | Attending: Internal Medicine

## 2022-06-19 DIAGNOSIS — Z01812 Encounter for preprocedural laboratory examination: Secondary | ICD-10-CM | POA: Diagnosis not present

## 2022-06-19 DIAGNOSIS — Z950 Presence of cardiac pacemaker: Secondary | ICD-10-CM

## 2022-06-19 DIAGNOSIS — I5032 Chronic diastolic (congestive) heart failure: Secondary | ICD-10-CM | POA: Diagnosis not present

## 2022-06-19 DIAGNOSIS — I452 Bifascicular block: Secondary | ICD-10-CM

## 2022-06-19 DIAGNOSIS — I4821 Permanent atrial fibrillation: Secondary | ICD-10-CM | POA: Diagnosis not present

## 2022-06-19 DIAGNOSIS — Z79899 Other long term (current) drug therapy: Secondary | ICD-10-CM | POA: Diagnosis not present

## 2022-06-20 LAB — BASIC METABOLIC PANEL
BUN/Creatinine Ratio: 16 (ref 10–24)
BUN: 18 mg/dL (ref 8–27)
CO2: 35 mmol/L — ABNORMAL HIGH (ref 20–29)
Calcium: 10.4 mg/dL — ABNORMAL HIGH (ref 8.6–10.2)
Chloride: 89 mmol/L — ABNORMAL LOW (ref 96–106)
Creatinine, Ser: 1.13 mg/dL (ref 0.76–1.27)
Glucose: 112 mg/dL — ABNORMAL HIGH (ref 70–99)
Potassium: 3.6 mmol/L (ref 3.5–5.2)
Sodium: 139 mmol/L (ref 134–144)
eGFR: 67 mL/min/{1.73_m2} (ref >59)

## 2022-06-20 LAB — CBC
Hematocrit: 46.8 % (ref 37.5–51.0)
Hemoglobin: 17.3 g/dL (ref 13.0–17.7)
MCH: 35.2 pg — ABNORMAL HIGH (ref 26.6–33.0)
MCHC: 37 g/dL — ABNORMAL HIGH (ref 31.5–35.7)
MCV: 95 fL (ref 79–97)
Platelets: 248 10*3/uL (ref 150–450)
RBC: 4.92 x10E6/uL (ref 4.14–5.80)
RDW: 12.7 % (ref 11.6–15.4)
WBC: 6.2 10*3/uL (ref 3.4–10.8)

## 2022-06-20 LAB — ERYTHROPOIETIN: Erythropoietin: 9.7 m[IU]/mL (ref 2.6–18.5)

## 2022-06-21 NOTE — Pre-Procedure Instructions (Signed)
Attempted to call patient regarding procedure instructions.  Left voice mail on the following items: Arrival time 0830 Nothing to eat or drink after midnight No meds AM of procedure Responsible person to drive you home and stay with you for 24 hrs Wash with special soap night before and morning of procedure If on anti-coagulant drug instructions Xarelto- last dose today

## 2022-06-24 ENCOUNTER — Ambulatory Visit (HOSPITAL_COMMUNITY)
Admission: RE | Admit: 2022-06-24 | Discharge: 2022-06-24 | Disposition: A | Discharge status: Home / Self Care | Payer: Medicare Other | Attending: Internal Medicine | Admitting: Internal Medicine

## 2022-06-24 ENCOUNTER — Other Ambulatory Visit: Payer: Self-pay

## 2022-06-24 ENCOUNTER — Encounter (HOSPITAL_COMMUNITY): Payer: Self-pay | Admitting: Internal Medicine

## 2022-06-24 ENCOUNTER — Ambulatory Visit (HOSPITAL_COMMUNITY)
Admission: RE | Disposition: A | Discharge status: Home / Self Care | Payer: Self-pay | Source: Home / Self Care | Attending: Internal Medicine

## 2022-06-24 DIAGNOSIS — I4891 Unspecified atrial fibrillation: Secondary | ICD-10-CM | POA: Diagnosis not present

## 2022-06-24 DIAGNOSIS — Z950 Presence of cardiac pacemaker: Secondary | ICD-10-CM | POA: Insufficient documentation

## 2022-06-24 DIAGNOSIS — Z45010 Encounter for checking and testing of cardiac pacemaker pulse generator [battery]: Secondary | ICD-10-CM | POA: Insufficient documentation

## 2022-06-24 DIAGNOSIS — R001 Bradycardia, unspecified: Secondary | ICD-10-CM | POA: Diagnosis not present

## 2022-06-24 HISTORY — PX: PPM GENERATOR CHANGEOUT: EP1233

## 2022-06-24 SURGERY — PPM GENERATOR CHANGEOUT

## 2022-06-24 MED ORDER — CHLORHEXIDINE GLUCONATE 4 % EX LIQD
4.0000 | Freq: Once | CUTANEOUS | Status: DC
  Filled 2022-06-24: qty 60

## 2022-06-24 MED ORDER — MIDAZOLAM HCL 5 MG/5ML IJ SOLN
INTRAMUSCULAR | Status: AC
  Filled 2022-06-24: qty 5

## 2022-06-24 MED ORDER — LIDOCAINE HCL (PF) 1 % IJ SOLN
INTRAMUSCULAR | Status: AC
  Filled 2022-06-24: qty 60

## 2022-06-24 MED ORDER — CEFAZOLIN SODIUM-DEXTROSE 2-4 GM/100ML-% IV SOLN
INTRAVENOUS | Status: AC
  Filled 2022-06-24: qty 100

## 2022-06-24 MED ORDER — SODIUM CHLORIDE 0.9 % IV SOLN
INTRAVENOUS | Status: AC
  Filled 2022-06-24: qty 2

## 2022-06-24 MED ORDER — FENTANYL CITRATE (PF) 100 MCG/2ML IJ SOLN
INTRAMUSCULAR | Status: DC | PRN
  Administered 2022-06-24: 50 ug via INTRAVENOUS
  Administered 2022-06-24 (×2): 25 ug via INTRAVENOUS

## 2022-06-24 MED ORDER — SODIUM CHLORIDE 0.9 % IV SOLN
80.0000 mg | INTRAVENOUS | Status: AC
  Administered 2022-06-24: 80 mg

## 2022-06-24 MED ORDER — CEFAZOLIN SODIUM-DEXTROSE 2-4 GM/100ML-% IV SOLN
2.0000 g | INTRAVENOUS | Status: AC
  Administered 2022-06-24: 2 g via INTRAVENOUS

## 2022-06-24 MED ORDER — SODIUM CHLORIDE 0.9 % IV SOLN: INTRAVENOUS | Status: DC

## 2022-06-24 MED ORDER — LIDOCAINE HCL (PF) 1 % IJ SOLN
INTRAMUSCULAR | Status: DC | PRN
  Administered 2022-06-24: 60 mL

## 2022-06-24 MED ORDER — MIDAZOLAM HCL 5 MG/5ML IJ SOLN
INTRAMUSCULAR | Status: DC | PRN
  Administered 2022-06-24: 1 mg via INTRAVENOUS
  Administered 2022-06-24: 2 mg via INTRAVENOUS
  Administered 2022-06-24: 1 mg via INTRAVENOUS

## 2022-06-24 MED ORDER — ACETAMINOPHEN 325 MG PO TABS: 325.0000 mg | ORAL_TABLET | ORAL | Status: DC | PRN

## 2022-06-24 MED ORDER — FENTANYL CITRATE (PF) 100 MCG/2ML IJ SOLN
INTRAMUSCULAR | Status: AC
  Filled 2022-06-24: qty 2

## 2022-06-24 SURGICAL SUPPLY — 4 items
CABLE SURGICAL S-101-97-12 (CABLE) ×2 IMPLANT
PACEMAKER ACCOLADE DR-EL (Pacemaker) IMPLANT
PAD DEFIB RADIO PHYSIO CONN (PAD) ×2 IMPLANT
TRAY PACEMAKER INSERTION (PACKS) ×2 IMPLANT

## 2022-06-24 NOTE — H&P (Signed)
No  changes since last seen except feels more energetic with a faster lower rate limit and is bothered by thumping 2/2 unipolar pacing  Hopefully we can make him more better

## 2022-06-24 NOTE — Discharge Instructions (Signed)

## 2022-06-25 MED FILL — Gentamicin Sulfate Inj 40 MG/ML: INTRAMUSCULAR | Qty: 80 | Status: AC

## 2022-06-25 MED FILL — Cefazolin Sodium-Dextrose IV Solution 2 GM/100ML-4%: INTRAVENOUS | Qty: 100 | Status: AC

## 2022-06-27 ENCOUNTER — Other Ambulatory Visit: Payer: Self-pay | Admitting: Internal Medicine

## 2022-06-27 DIAGNOSIS — I4821 Permanent atrial fibrillation: Secondary | ICD-10-CM

## 2022-07-01 NOTE — Telephone Encounter (Signed)
Xarelto '20mg'$  refill request received. Pt is 76 years old, weight-86.2kg, Crea-1.13 on 06/19/2022, last seen by Dr. Caryl Comes on 06/05/2022, Diagnosis-Afib, CrCl- 65.69 mL/min; Dose is appropriate based on dosing criteria. Will send in refill to requested pharmacy.

## 2022-07-05 ENCOUNTER — Ambulatory Visit: Payer: Medicare Other | Attending: Cardiovascular Disease

## 2022-07-05 DIAGNOSIS — I495 Sick sinus syndrome: Secondary | ICD-10-CM

## 2022-07-05 LAB — CUP PACEART INCLINIC DEVICE CHECK
Date Time Interrogation Session: 20231201141916
Implantable Lead Connection Status: 753985
Implantable Lead Connection Status: 753985
Implantable Lead Implant Date: 20140421
Implantable Lead Implant Date: 20140421
Implantable Lead Location: 753859
Implantable Lead Location: 753860
Implantable Lead Model: 5076
Implantable Lead Model: 5076
Implantable Pulse Generator Implant Date: 20231120
Lead Channel Impedance Value: 610 Ohm
Lead Channel Impedance Value: 731 Ohm
Lead Channel Pacing Threshold Amplitude: 0.9 V
Lead Channel Pacing Threshold Pulse Width: 0.4 ms
Lead Channel Sensing Intrinsic Amplitude: 13.8 mV
Lead Channel Setting Pacing Amplitude: 2.6 V
Lead Channel Setting Pacing Pulse Width: 0.4 ms
Lead Channel Setting Sensing Sensitivity: 2.5 mV
Pulse Gen Serial Number: 617081
Zone Setting Status: 755011

## 2022-07-05 NOTE — Progress Notes (Signed)
Wound check appointment. Dermabond removed. Wound without redness or edema. Medial Incision edges un-approximated, steri-strips applied  Normal device function. Thresholds, sensing, and impedances consistent with implant measurements. Device programmed at auto capture programmed on for extra safety margin until 3 month visit. Histogram distribution appropriate for patient and level of activity. No ventricular rates noted. Patient educated about wound care, arm mobility. ROV 07/12/22 for wound re-check.

## 2022-07-05 NOTE — Patient Instructions (Signed)
   After Your Pacemaker   Monitor your pacemaker site for redness, swelling, and drainage. Call the device clinic at [phone] if you experience these symptoms or fever/chills.  May shower in 3 days, attempt to keep steri-strips dry in shower is possible.    There are no other restrictions in arm movement after your wound check appointment.  You may drive, unless driving has been restricted by your healthcare providers.    Remote monitoring is used to monitor your pacemaker from home. This monitoring is scheduled every 91 days by our office. It allows [place] to keep an eye on the functioning of your device to ensure it is working properly. You will routinely see your Electrophysiologist annually (more often if necessary).

## 2022-07-12 ENCOUNTER — Ambulatory Visit: Payer: Medicare Other | Attending: Student | Admitting: Student

## 2022-07-12 DIAGNOSIS — Z950 Presence of cardiac pacemaker: Secondary | ICD-10-CM

## 2022-07-12 NOTE — Patient Instructions (Signed)
Medication Instructions:  Your physician recommends that you continue on your current medications as directed. Please refer to the Current Medication list given to you today.  *If you need a refill on your cardiac medications before your next appointment, please call your pharmacy*  Lab Work: None ordered.  If you have labs (blood work) drawn today and your tests are completely normal, you will receive your results only by: [place] (if you have MyChart) OR A paper copy in the mail If you have any lab test that is abnormal or we need to change your treatment, we will call you to review the results.  Testing/Procedures: None ordered.  Follow-Up: At [facility], you and your health needs are our priority.  As part of our continuing mission to provide you with exceptional heart care, we have created designated Provider Care Teams.  These Care Teams include your primary Cardiologist (physician) and Advanced Practice Providers (APPs -  Physician Assistants and Nurse Practitioners) who all work together to provide you with the care you need, when you need it.  We recommend signing up for the patient portal called "MyChart".  Sign up information is provided on this After Visit Summary.  MyChart is used to connect with patients for Virtual Visits (Telemedicine).  Patients are able to view lab/test results, encounter notes, upcoming appointments, etc.  Non-urgent messages can be sent to your provider as well.   To learn more about what you can do with MyChart, go to [email or web address].    Your next appointment:   1 year(s)  The format for your next appointment:   In Person  Provider:   Legrand Como "Jonni Sanger" Chalmers Cater, PA-C   Important Information About Sugar

## 2022-07-12 NOTE — Progress Notes (Signed)
Add on for second wound check.   Pt previously removed steri-strips.   Pt has contact dermatitis up onto his neck where he placed saran wrap so that he could shower. Dr. Caryl Comes in to look and agree mostly consistent with contact dermatitis. No indication for ABx at this time.   Pt also having issue connecting with his home monitor, which he brought with him today. Connection completed with assistance from [facility]. Confirmed via latitude now communicating appropriately.    Follow up as scheduled. If redness around site gets worse or is accompanied by fevers, chills, or drainage, knows to call right away.   [name] [address]" [place], PA-C  07/12/2022 11:53 AM

## 2022-07-25 DIAGNOSIS — Z961 Presence of intraocular lens: Secondary | ICD-10-CM | POA: Diagnosis not present

## 2022-07-25 DIAGNOSIS — H40003 Preglaucoma, unspecified, bilateral: Secondary | ICD-10-CM | POA: Diagnosis not present

## 2022-07-25 DIAGNOSIS — H10823 Rosacea conjunctivitis, bilateral: Secondary | ICD-10-CM | POA: Diagnosis not present

## 2022-07-25 DIAGNOSIS — H43811 Vitreous degeneration, right eye: Secondary | ICD-10-CM | POA: Diagnosis not present

## 2022-08-06 ENCOUNTER — Encounter: Payer: Self-pay | Admitting: Internal Medicine

## 2022-09-24 ENCOUNTER — Ambulatory Visit (INDEPENDENT_AMBULATORY_CARE_PROVIDER_SITE_OTHER): Payer: Medicare Other

## 2022-09-24 DIAGNOSIS — I495 Sick sinus syndrome: Secondary | ICD-10-CM

## 2022-09-25 LAB — CUP PACEART REMOTE DEVICE CHECK
Battery Remaining Longevity: 180 mo
Battery Remaining Percentage: 100 %
Brady Statistic RA Percent Paced: 0 %
Brady Statistic RV Percent Paced: 99 %
Date Time Interrogation Session: 20240220072000
Implantable Lead Connection Status: 753985
Implantable Lead Connection Status: 753985
Implantable Lead Implant Date: 20140421
Implantable Lead Implant Date: 20140421
Implantable Lead Location: 753859
Implantable Lead Location: 753860
Implantable Lead Model: 5076
Implantable Lead Model: 5076
Implantable Pulse Generator Implant Date: 20231120
Lead Channel Impedance Value: 589 Ohm
Lead Channel Impedance Value: 731 Ohm
Lead Channel Pacing Threshold Amplitude: 0.8 V
Lead Channel Pacing Threshold Pulse Width: 0.4 ms
Lead Channel Setting Pacing Amplitude: 2.6 V
Lead Channel Setting Pacing Pulse Width: 0.4 ms
Lead Channel Setting Sensing Sensitivity: 2.5 mV
Pulse Gen Serial Number: 617081
Zone Setting Status: 755011

## 2022-10-07 ENCOUNTER — Ambulatory Visit: Payer: Medicare Other | Attending: Internal Medicine | Admitting: Internal Medicine

## 2022-10-07 ENCOUNTER — Encounter: Payer: Self-pay | Admitting: Internal Medicine

## 2022-10-07 VITALS — BP 124/68 | HR 70 | Ht 71.0 in | Wt 202.2 lb

## 2022-10-07 DIAGNOSIS — Z950 Presence of cardiac pacemaker: Secondary | ICD-10-CM | POA: Diagnosis not present

## 2022-10-07 DIAGNOSIS — I495 Sick sinus syndrome: Secondary | ICD-10-CM | POA: Insufficient documentation

## 2022-10-07 DIAGNOSIS — I4821 Permanent atrial fibrillation: Secondary | ICD-10-CM | POA: Insufficient documentation

## 2022-10-07 DIAGNOSIS — I452 Bifascicular block: Secondary | ICD-10-CM | POA: Diagnosis not present

## 2022-10-07 DIAGNOSIS — I2510 Atherosclerotic heart disease of native coronary artery without angina pectoris: Secondary | ICD-10-CM | POA: Insufficient documentation

## 2022-10-07 MED ORDER — SPIRONOLACTONE 25 MG PO TABS: 25.0000 mg | ORAL_TABLET | Freq: Every day | ORAL | 5 refills | Status: DC

## 2022-10-07 NOTE — Progress Notes (Signed)
Patient Care Team: [name], MD as PCP - General (Family Medicine) Deboraha Sprang, MD as PCP - Cardiology (Cardiology)   HPI  [name] is a 77 y.o. male Seen in followup for atrial fibrillation for which he took [place], underwent AFib ablation Greggory Brandy- 12/19) and DCCV 3/20 but failed to hold sinus and with severe R atriopathy >>permanent atrial fibrillation  [employer] pacemaker (DOI 4/14)..  There was device failure prompting generator reprogramming 11/23    Anticoagulation with rivaroxaban no bleeding  Exuberant alcohol intake   The patient denies chest pain, , nocturnal dyspnea, orthopnea or peripheral edema.  There have been no palpitations, lightheadedness or syncope.  Complains of mild shortness of breath but is considerably better with his lower rate programmed at 70 versus 60.     Date Cr K Hgb LDL  2/17  1.08 3.7     8/17  0.93 4.1     9/18 0.91 4.9 16.0   3/20  0.81 3.4 13.9   6/20 1.0 4.3 16.6   1/21 0.89 3.8 15.7   4/23 1.08 3.8 17.2   11/23   17.3       DATE TEST  EF    4/13 Echo   55-65 %    3/14    Myoview   Images Personally reviewed  Normal    11/19  Echo   LAE (45/2.05/26)   12/19 TEE 60-65% Severe R Atriopathy  8/21 Echo 55-60%    7/21 CTA   CA calcifications But not gated study ordered by mistake  3/23 Myoview  68% No ischemia       Thromboembolic risk factors ( age  -2, HTN-1, Vasc disease -1) for a CHADSVASc Score of 4     Past Medical History:  Diagnosis Date   1st degree AV block    Atrial flutter ([place])    s/p ablation 2006   CAD (coronary artery disease)    Mild 2 vessel disease per cath in 2006; myoview neg 2011   Chronic anticoagulation    on Xarelto - started 11/12/11   Depression 11-05-11   On Cymbalta   GERD (gastroesophageal reflux disease)    Hyperlipidemia    Hypertension    Major depression single episode, 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   Normal nuclear stress test 2011   Persistent atrial  fibrillation ([place])    recent new occurrence 3/13>>Tikosyn   PVC's (premature ventricular contractions)    Right ventricular enlargement    Mild noted echo 3/13   Sick sinus syndrome ([place]) 11/23/2012   Dr Caryl Comes   Tobacco abuse     Past Surgical History:  Procedure Laterality Date   ABLATION OF DYSRHYTHMIC FOCUS  07/07/2018   ATRIAL FIBRILLATION ABLATION N/A 07/07/2018   Procedure: ATRIAL FIBRILLATION ABLATION;  Surgeon: [name], MD;  Location: [place] CV LAB;  Service: Cardiovascular;  Laterality: N/A;   CARDIAC CATHETERIZATION  11/1988, 12/07/2004   showing essentially normal left  ventricular function, moderate two-vessel disease (50% LAD after the third diagonal and a 60+% stenosis in the second obtuse marginal)          CARDIOVERSION N/A 12/05/2011   Procedure: CARDIOVERSION;  Surgeon: Deboraha Sprang, MD;  Location: [facility] CATH LAB;  Service: Cardiovascular;  Laterality: N/A;   CARDIOVERSION N/A 08/07/2018   Procedure: CARDIOVERSION;  Surgeon: Lelon Perla, MD;  Location: [facility] ENDOSCOPY;  Service: Cardiovascular;  Laterality: N/A;   CARDIOVERSION N/A 10/09/2018   Procedure:  CARDIOVERSION;  Surgeon: Pixie Casino, MD;  Location: [place];  Service: Cardiovascular;  Laterality: N/A;   [place]   left-retained hardware   [place]  01/28/2008   Recurrent right inguinal hernia Laparoscopic, preperitoneal repair of recurrent right inguinal hernia with mesh -- SURGEON:  Dr. Fanny Skates.   INGUINAL HERNIA REPAIR  11/06/2011   Procedure: HERNIA REPAIR INGUINAL ADULT;  Surgeon: Adin Hector, MD;  [facility];  Service: General;  Laterality: Left;   PACEMAKER INSERTION  11/23/2012   Dr Caryl Comes   PERMANENT PACEMAKER INSERTION N/A 11/23/2012   Procedure: PERMANENT PACEMAKER INSERTION;  Surgeon: Deboraha Sprang, MD;  Location: [facility] CATH LAB;  Service: Cardiovascular;  Laterality: N/A;   TEE WITH CARDIOVERSION  2001, 2006   Invasive  electrophysiology study, electroanatomical mapping,and radiofrequency catheter ablation. -- patient's arrhythmia was isthmus dependent flutter not withstanding the very long cycle length.  RF energy delivered across the cavotricuspid isthmus successfully interrupted conduction across the isthmus and eliminated the patient's sub-straight. PHYSICIAN:  Deboraha Sprang, M.D   TEE WITHOUT CARDIOVERSION  12/31/2011   Procedure: TRANSESOPHAGEAL ECHOCARDIOGRAM (TEE);  Surgeon: Josue Hector, MD;  Location: [place];  Service: Cardiovascular;  Laterality: N/A;  tba after for tikosyn   TEE WITHOUT CARDIOVERSION N/A 07/07/2018   Procedure: TRANSESOPHAGEAL ECHOCARDIOGRAM (TEE);  Surgeon: Acie Fredrickson Wonda Cheng, MD;  Location: [facility] ENDOSCOPY;  Service: Cardiovascular;  Laterality: N/A;   VASECTOMY      Current Outpatient Medications  Medication Sig Dispense Refill   diphenhydrAMINE (BENADRYL) 25 MG tablet Take 50 mg by mouth at bedtime.     fluticasone (FLONASE) 50 MCG/ACT nasal spray Place 1-2 sprays into both nostrils at bedtime.      furosemide (LASIX) 40 MG tablet TAKE 3 TABLETS DAILY 270 tablet 2   metolazone (ZAROXOLYN) 2.5 MG tablet TAKE 1 TABLET AS NEEDED FOR EDEMA 90 tablet 3   rivaroxaban (XARELTO) 20 MG TABS tablet TAKE 1 TABLET DAILY WITH SUPPER 90 tablet 1   VIAGRA 100 MG tablet Take 100 mg by mouth as needed for erectile dysfunction.      No current facility-administered medications for this visit.    Allergies  Allergen Reactions   Duloxetine Hcl     Other reaction(s): nausea, diarrhea   Horse-Derived Products Hives   Lexapro [Escitalopram] Other (See Comments)   Other     Other reaction(s): jaw clenching    Review of Systems negative except from HPI and PMH  Physical Exam BP 138/76   Pulse 73   Ht 5' 11.5" (1.816 m)   Wt 204 lb (92.5 kg)   SpO2 96%   BMI 28.06 kg/m  Well developed and well nourished in no acute distress HENT normal Neck supple with JVP-flat Clear Device  pocket well healed; without hematoma or erythema.  There is no tethering  Regular rate and rhythm, no  gallop No  murmur Abd-soft with active BS No Clubbing cyanosis  edema Skin-warm and dry A & Oriented  Grossly normal sensory and motor function  ECG atrial fibrillation at 70 Interval-/19/48 Axis left -85  Device function is normal. Programming changes   See Paceart for details     Assessment and  Plan  Coronary artery disease-cath 2006 moderate nonobstructive  Atrial  Fibrillation-permanent  Bifascicular block  Pacemaker  BSx    Hypertension  HFpEF  Ventricular pacing > 50%   Polycythemia  No symptoms of angina  Improved  exercise tolerance with reprogramming of his device to a lower rate limit of 70  No bleeding, continue rivaroxaban  Euvolemic.  Continue the Lasix and as needed Zaroxolyn  We discussed spironolactone in 2017.  Reviewed again today, in the context of HFpEF data, we will try it and exchange it for the Zaroxolyn.  Will get a BMET a couple of weeks and then we will touch base with him and see how he is doing with his fluid status

## 2022-10-07 NOTE — Patient Instructions (Addendum)
Medication Instructions:  Your physician has recommended you make the following change in your medication: START Taking: New medication Spironolactone 25 mg You will-  Take 1 tablet (25 mg total) by mouth daily   STOP TAKING / HOLD Zaroxolyn 2.5 mg.  Do NOT take this medication while taking Spironolactone.     Lab Work: Please schedule a 2 week BMET lab draw appointment per Dr Virl Axe.    Testing/Procedures: None ordered.  Follow-Up: At [facility], you and your health needs are our priority.  As part of our continuing mission to provide you with exceptional heart care, we have created designated Provider Care Teams.  These Care Teams include your primary Cardiologist (physician) and Advanced Practice Providers (APPs -  Physician Assistants and Nurse Practitioners) who all work together to provide you with the care you need, when you need it.  We recommend signing up for the patient portal called "MyChart".  Sign up information is provided on this After Visit Summary.  MyChart is used to connect with patients for Virtual Visits (Telemedicine).  Patients are able to view lab/test results, encounter notes, upcoming appointments, etc.  Non-urgent messages can be sent to your provider as well.   To learn more about what you can do with MyChart, go to [email or web address].    Your next appointment:   1 year(s)  The format for your next appointment:   In Person  Provider:   Virl Axe, MD{or one of the following Advanced Practice Providers on your designated Care Team:   Tommye Standard, [place] Legrand Como "Jonni Sanger" Chalmers Cater, [place]  Remote monitoring is used to monitor your Pacemaker from home. This monitoring reduces the number of office visits required to check your device to one time per year. It allows [place] to keep an eye on the functioning of your device to ensure it is working properly. You are scheduled for a device check from home on 12/24/22. You may send your transmission at any time  that day. If you have a wireless device, the transmission will be sent automatically. After your physician reviews your transmission, you will receive a postcard with your next transmission date.

## 2022-10-10 ENCOUNTER — Encounter: Payer: Self-pay | Admitting: Internal Medicine

## 2022-10-15 DIAGNOSIS — L821 Other seborrheic keratosis: Secondary | ICD-10-CM | POA: Diagnosis not present

## 2022-10-15 DIAGNOSIS — L111 Transient acantholytic dermatosis [Grover]: Secondary | ICD-10-CM | POA: Diagnosis not present

## 2022-10-15 DIAGNOSIS — D485 Neoplasm of uncertain behavior of skin: Secondary | ICD-10-CM | POA: Diagnosis not present

## 2022-10-15 DIAGNOSIS — L578 Other skin changes due to chronic exposure to nonionizing radiation: Secondary | ICD-10-CM | POA: Diagnosis not present

## 2022-10-15 DIAGNOSIS — Z85828 Personal history of other malignant neoplasm of skin: Secondary | ICD-10-CM | POA: Diagnosis not present

## 2022-10-15 DIAGNOSIS — L570 Actinic keratosis: Secondary | ICD-10-CM | POA: Diagnosis not present

## 2022-10-15 DIAGNOSIS — D0471 Carcinoma in situ of skin of right lower limb, including hip: Secondary | ICD-10-CM | POA: Diagnosis not present

## 2022-10-15 DIAGNOSIS — D2262 Melanocytic nevi of left upper limb, including shoulder: Secondary | ICD-10-CM | POA: Diagnosis not present

## 2022-10-15 DIAGNOSIS — D225 Melanocytic nevi of trunk: Secondary | ICD-10-CM | POA: Diagnosis not present

## 2022-10-15 DIAGNOSIS — D2271 Melanocytic nevi of right lower limb, including hip: Secondary | ICD-10-CM | POA: Diagnosis not present

## 2022-10-15 DIAGNOSIS — L853 Xerosis cutis: Secondary | ICD-10-CM | POA: Diagnosis not present

## 2022-10-21 ENCOUNTER — Ambulatory Visit: Payer: Medicare Other | Attending: Internal Medicine

## 2022-10-21 DIAGNOSIS — I495 Sick sinus syndrome: Secondary | ICD-10-CM

## 2022-10-21 DIAGNOSIS — I2510 Atherosclerotic heart disease of native coronary artery without angina pectoris: Secondary | ICD-10-CM

## 2022-10-21 DIAGNOSIS — I452 Bifascicular block: Secondary | ICD-10-CM

## 2022-10-21 DIAGNOSIS — Z950 Presence of cardiac pacemaker: Secondary | ICD-10-CM | POA: Diagnosis not present

## 2022-10-21 DIAGNOSIS — I4821 Permanent atrial fibrillation: Secondary | ICD-10-CM

## 2022-10-22 LAB — BASIC METABOLIC PANEL
BUN/Creatinine Ratio: 18 (ref 10–24)
BUN: 17 mg/dL (ref 8–27)
CO2: 29 mmol/L (ref 20–29)
Calcium: 9.8 mg/dL (ref 8.6–10.2)
Chloride: 96 mmol/L (ref 96–106)
Creatinine, Ser: 0.95 mg/dL (ref 0.76–1.27)
Glucose: 89 mg/dL (ref 70–99)
Potassium: 3.4 mmol/L — ABNORMAL LOW (ref 3.5–5.2)
Sodium: 144 mmol/L (ref 134–144)
eGFR: 82 mL/min/{1.73_m2} (ref >59)

## 2022-10-24 ENCOUNTER — Telehealth: Payer: Self-pay

## 2022-10-24 MED ORDER — POTASSIUM CHLORIDE CRYS ER 10 MEQ PO TBCR: EXTENDED_RELEASE_TABLET | ORAL | 3 refills | Status: DC

## 2022-10-24 NOTE — Telephone Encounter (Signed)
Spoke with pt and advised of lab result per Dr Caryl Comes.  Pt agreeable to take KCL 31meq - 1 tablet by mouth on the days he takes Metolazone.  Pt thanked Therapist, sports for the call.

## 2022-10-24 NOTE — Telephone Encounter (Signed)
-----   Message from Paul Sprang, MD sent at 10/24/2022  8:21 AM EDT ----- Please Inform Patient   Labs are normal x low potassium Why dot we have him take KCL 10 on the days he takes his metolazone    Thanks

## 2022-10-24 NOTE — Progress Notes (Signed)
Remote pacemaker transmission.   

## 2022-10-30 ENCOUNTER — Encounter: Payer: Self-pay | Admitting: Internal Medicine

## 2022-11-19 DIAGNOSIS — L820 Inflamed seborrheic keratosis: Secondary | ICD-10-CM | POA: Diagnosis not present

## 2022-11-19 DIAGNOSIS — D0472 Carcinoma in situ of skin of left lower limb, including hip: Secondary | ICD-10-CM | POA: Diagnosis not present

## 2022-11-26 DIAGNOSIS — R7303 Prediabetes: Secondary | ICD-10-CM | POA: Diagnosis not present

## 2022-11-26 DIAGNOSIS — M720 Palmar fascial fibromatosis [Dupuytren]: Secondary | ICD-10-CM | POA: Diagnosis not present

## 2022-11-26 DIAGNOSIS — Z0000 Encounter for general adult medical examination without abnormal findings: Secondary | ICD-10-CM | POA: Diagnosis not present

## 2022-11-26 DIAGNOSIS — I4891 Unspecified atrial fibrillation: Secondary | ICD-10-CM | POA: Diagnosis not present

## 2022-11-26 DIAGNOSIS — Z1331 Encounter for screening for depression: Secondary | ICD-10-CM | POA: Diagnosis not present

## 2022-11-26 DIAGNOSIS — E7800 Pure hypercholesterolemia, unspecified: Secondary | ICD-10-CM | POA: Diagnosis not present

## 2022-11-26 DIAGNOSIS — I509 Heart failure, unspecified: Secondary | ICD-10-CM | POA: Diagnosis not present

## 2022-11-26 DIAGNOSIS — D6869 Other thrombophilia: Secondary | ICD-10-CM | POA: Diagnosis not present

## 2022-12-05 DIAGNOSIS — M720 Palmar fascial fibromatosis [Dupuytren]: Secondary | ICD-10-CM | POA: Diagnosis not present

## 2022-12-23 ENCOUNTER — Other Ambulatory Visit: Payer: Self-pay | Admitting: Internal Medicine

## 2022-12-24 ENCOUNTER — Ambulatory Visit (INDEPENDENT_AMBULATORY_CARE_PROVIDER_SITE_OTHER): Payer: Medicare Other

## 2022-12-24 DIAGNOSIS — I495 Sick sinus syndrome: Secondary | ICD-10-CM | POA: Diagnosis not present

## 2022-12-24 LAB — CUP PACEART REMOTE DEVICE CHECK
Battery Remaining Longevity: 180 mo
Battery Remaining Percentage: 100 %
Brady Statistic RA Percent Paced: 0 %
Brady Statistic RV Percent Paced: 100 %
Date Time Interrogation Session: 20240521064200
Implantable Lead Connection Status: 753985
Implantable Lead Connection Status: 753985
Implantable Lead Implant Date: 20140421
Implantable Lead Implant Date: 20140421
Implantable Lead Location: 753859
Implantable Lead Location: 753860
Implantable Lead Model: 5076
Implantable Lead Model: 5076
Implantable Pulse Generator Implant Date: 20231120
Lead Channel Impedance Value: 508 Ohm
Lead Channel Impedance Value: 725 Ohm
Lead Channel Pacing Threshold Amplitude: 0.8 V
Lead Channel Pacing Threshold Pulse Width: 0.4 ms
Lead Channel Setting Pacing Amplitude: 2.6 V
Lead Channel Setting Pacing Pulse Width: 0.4 ms
Lead Channel Setting Sensing Sensitivity: 2.5 mV
Pulse Gen Serial Number: 617081
Zone Setting Status: 755011

## 2023-01-16 NOTE — Progress Notes (Signed)
Remote pacemaker transmission.   

## 2023-03-24 ENCOUNTER — Other Ambulatory Visit: Payer: Self-pay | Admitting: Internal Medicine

## 2023-03-24 DIAGNOSIS — I4821 Permanent atrial fibrillation: Secondary | ICD-10-CM

## 2023-03-24 NOTE — Telephone Encounter (Signed)
Prescription refill request for Xarelto received.  Indication:afib Last office visit:3/24 Weight:91.7  kg Age:77 Scr:0.95  3/24 CrCl:83.12  ml/min  Prescription refilled

## 2023-03-25 ENCOUNTER — Ambulatory Visit: Payer: Medicare Other

## 2023-03-25 DIAGNOSIS — I495 Sick sinus syndrome: Secondary | ICD-10-CM | POA: Diagnosis not present

## 2023-03-25 LAB — CUP PACEART REMOTE DEVICE CHECK
Battery Remaining Longevity: 180 mo
Battery Remaining Percentage: 100 %
Brady Statistic RA Percent Paced: 0 %
Brady Statistic RV Percent Paced: 100 %
Date Time Interrogation Session: 20240820073400
Implantable Lead Connection Status: 753985
Implantable Lead Connection Status: 753985
Implantable Lead Implant Date: 20140421
Implantable Lead Implant Date: 20140421
Implantable Lead Location: 753859
Implantable Lead Location: 753860
Implantable Lead Model: 5076
Implantable Lead Model: 5076
Implantable Pulse Generator Implant Date: 20231120
Lead Channel Impedance Value: 586 Ohm
Lead Channel Impedance Value: 743 Ohm
Lead Channel Pacing Threshold Amplitude: 0.8 V
Lead Channel Pacing Threshold Pulse Width: 0.4 ms
Lead Channel Setting Pacing Amplitude: 2.6 V
Lead Channel Setting Pacing Pulse Width: 0.4 ms
Lead Channel Setting Sensing Sensitivity: 2.5 mV
Pulse Gen Serial Number: 617081
Zone Setting Status: 755011

## 2023-04-04 NOTE — Progress Notes (Signed)
Remote pacemaker transmission.   

## 2023-04-08 ENCOUNTER — Other Ambulatory Visit: Payer: Self-pay | Admitting: Medical Genetics

## 2023-04-08 DIAGNOSIS — Z006 Encounter for examination for normal comparison and control in clinical research program: Secondary | ICD-10-CM

## 2023-04-10 ENCOUNTER — Other Ambulatory Visit (HOSPITAL_COMMUNITY)
Admission: RE | Admit: 2023-04-10 | Discharge: 2023-04-10 | Disposition: A | Discharge status: Home / Self Care | Payer: Medicare Other | Source: Ambulatory Visit | Attending: Oncology | Admitting: Oncology

## 2023-04-10 DIAGNOSIS — Z006 Encounter for examination for normal comparison and control in clinical research program: Secondary | ICD-10-CM | POA: Insufficient documentation

## 2023-04-22 LAB — GENECONNECT MOLECULAR SCREEN: Genetic Analysis Overall Interpretation: NEGATIVE

## 2023-04-23 ENCOUNTER — Other Ambulatory Visit: Payer: Self-pay

## 2023-04-23 MED ORDER — POTASSIUM CHLORIDE CRYS ER 10 MEQ PO TBCR: EXTENDED_RELEASE_TABLET | ORAL | 1 refills | Status: DC

## 2023-04-29 DIAGNOSIS — C44619 Basal cell carcinoma of skin of left upper limb, including shoulder: Secondary | ICD-10-CM | POA: Diagnosis not present

## 2023-04-29 DIAGNOSIS — D225 Melanocytic nevi of trunk: Secondary | ICD-10-CM | POA: Diagnosis not present

## 2023-04-29 DIAGNOSIS — L578 Other skin changes due to chronic exposure to nonionizing radiation: Secondary | ICD-10-CM | POA: Diagnosis not present

## 2023-04-29 DIAGNOSIS — L111 Transient acantholytic dermatosis [Grover]: Secondary | ICD-10-CM | POA: Diagnosis not present

## 2023-04-29 DIAGNOSIS — Z85828 Personal history of other malignant neoplasm of skin: Secondary | ICD-10-CM | POA: Diagnosis not present

## 2023-04-29 DIAGNOSIS — L570 Actinic keratosis: Secondary | ICD-10-CM | POA: Diagnosis not present

## 2023-04-29 DIAGNOSIS — L821 Other seborrheic keratosis: Secondary | ICD-10-CM | POA: Diagnosis not present

## 2023-04-29 DIAGNOSIS — D485 Neoplasm of uncertain behavior of skin: Secondary | ICD-10-CM | POA: Diagnosis not present

## 2023-04-29 DIAGNOSIS — D2271 Melanocytic nevi of right lower limb, including hip: Secondary | ICD-10-CM | POA: Diagnosis not present

## 2023-04-29 DIAGNOSIS — D2262 Melanocytic nevi of left upper limb, including shoulder: Secondary | ICD-10-CM | POA: Diagnosis not present

## 2023-05-01 HISTORY — PX: DENTAL SURGERY: SHX609

## 2023-05-15 DIAGNOSIS — H02834 Dermatochalasis of left upper eyelid: Secondary | ICD-10-CM | POA: Insufficient documentation

## 2023-05-15 DIAGNOSIS — H02831 Dermatochalasis of right upper eyelid: Secondary | ICD-10-CM | POA: Diagnosis not present

## 2023-05-16 ENCOUNTER — Encounter: Payer: Self-pay | Admitting: Internal Medicine

## 2023-05-21 ENCOUNTER — Other Ambulatory Visit: Payer: Self-pay

## 2023-05-21 MED ORDER — METOLAZONE 2.5 MG PO TABS: 2.5000 mg | ORAL_TABLET | ORAL | 1 refills | Status: DC

## 2023-05-23 ENCOUNTER — Other Ambulatory Visit: Payer: Self-pay

## 2023-05-23 MED ORDER — POTASSIUM CHLORIDE CRYS ER 10 MEQ PO TBCR: EXTENDED_RELEASE_TABLET | ORAL | 0 refills | Status: DC

## 2023-06-10 ENCOUNTER — Ambulatory Visit
Admission: EM | Admit: 2023-06-10 | Discharge: 2023-06-10 | Disposition: A | Discharge status: Home / Self Care | Payer: Medicare Other

## 2023-06-10 DIAGNOSIS — D099 Carcinoma in situ, unspecified: Secondary | ICD-10-CM | POA: Insufficient documentation

## 2023-06-10 DIAGNOSIS — Z23 Encounter for immunization: Secondary | ICD-10-CM

## 2023-06-10 DIAGNOSIS — H908 Mixed conductive and sensorineural hearing loss, unspecified: Secondary | ICD-10-CM | POA: Insufficient documentation

## 2023-06-10 DIAGNOSIS — S61213A Laceration without foreign body of left middle finger without damage to nail, initial encounter: Secondary | ICD-10-CM

## 2023-06-10 DIAGNOSIS — I509 Heart failure, unspecified: Secondary | ICD-10-CM | POA: Insufficient documentation

## 2023-06-10 DIAGNOSIS — Z461 Encounter for fitting and adjustment of hearing aid: Secondary | ICD-10-CM | POA: Insufficient documentation

## 2023-06-10 DIAGNOSIS — H905 Unspecified sensorineural hearing loss: Secondary | ICD-10-CM | POA: Insufficient documentation

## 2023-06-10 DIAGNOSIS — I4891 Unspecified atrial fibrillation: Secondary | ICD-10-CM | POA: Insufficient documentation

## 2023-06-10 MED ORDER — LIDOCAINE-EPINEPHRINE-TETRACAINE (LET) TOPICAL GEL
3.0000 mL | Freq: Once | TOPICAL | Status: AC
  Administered 2023-06-10: 3 mL via TOPICAL

## 2023-06-10 MED ORDER — TETANUS-DIPHTH-ACELL PERTUSSIS 5-2.5-18.5 LF-MCG/0.5 IM SUSY
0.5000 mL | PREFILLED_SYRINGE | Freq: Once | INTRAMUSCULAR | Status: AC
  Administered 2023-06-10: 0.5 mL via INTRAMUSCULAR

## 2023-06-10 NOTE — ED Triage Notes (Signed)
Last Tdap: 09-22-2006 Per Chart.  "I cut my finger, left hand (middle) while cutting a piece of fudge with new knives". DOI: 06-09-2023 "at dinner time". "I am on a blood thinner".

## 2023-06-10 NOTE — ED Provider Notes (Signed)
[facility]    CSN: [id number] Arrival date & time: 06/10/23  1453      History   Chief Complaint Chief Complaint  Patient presents with   Laceration    Finger    HPI [name] is a 77 y.o. male.   Patient here today for evaluation of laceration to his left middle finger that occurred while cutting such with Lunox.  He states accident occurred yesterday.  He is currently on blood thinning medication.  He has not had any numbness or tingling.  The history is provided by the patient.  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   Normal nuclear stress test 2011   Persistent atrial fibrillation (HCC)    recent new occurrence 3/13>>Tikosyn   PVC's (premature ventricular contractions)    Right ventricular enlargement    Mild noted echo 3/13   Sick sinus syndrome (HCC) 11/23/2012   Dr Graciela Husbands   Tobacco abuse     Patient Active Problem List   Diagnosis Date Noted   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   Chronic congestive heart failure (HCC) 06/10/2023   Encounter for fitting and adjustment of hearing aid 06/10/2023   Sensorineural hearing loss 06/10/2023   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   Controlled atrial fibrillation (HCC) 06/10/2023   Dermatochalasis of both upper eyelids 05/15/2023   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   Allergic rhinitis due to house dust mite 12/18/2020   Dupuytren's contracture 12/18/2020    Elevated blood-pressure reading without diagnosis of hypertension 12/18/2020   Erectile dysfunction 12/18/2020   History of atrial fibrillation 12/18/2020   Hyperglycemia 12/18/2020   Lateral epicondylitis 12/18/2020   Major depression single episode, 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   Moderate recurrent major depression (HCC) 12/18/2020   Pain in thoracic spine 12/18/2020   Prediabetes 12/18/2020   Pure hypercholesterolemia 12/18/2020   Reduced libido 12/18/2020   Right upper quadrant pain 12/18/2020   Transient acantholytic dermatosis 12/18/2020   Allergic rhinitis 12/18/2020   (HFpEF) heart failure with preserved ejection fraction (HCC) 02/15/2020   Sinus node dysfunction (HCC) 08/10/2019   Persistent atrial fibrillation (HCC) 07/07/2018   Other allergic rhinitis 09/03/2017   Wheezing 09/03/2017   Pacemaker-[employer] 02/25/2013   Chronotropic incompetence 11/19/2012   First degree AV block 11/19/2012   Dyspnea on exertion 10/16/2012   Chest pain, exertional 10/16/2012   A-fib (HCC)    Recurrent left inguinal hernia 10/14/2011   CAD (coronary artery disease) 02/12/2011   Palpitation 02/12/2011   Typical atrial flutter (HCC) 02/12/2011    Past Surgical History:  Procedure Laterality Date   ABLATION OF DYSRHYTHMIC FOCUS  07/07/2018   ATRIAL FIBRILLATION ABLATION N/A 07/07/2018   Procedure: ATRIAL FIBRILLATION ABLATION;  Surgeon: Hillis Range, MD;  Location: [facility] INVASIVE CV LAB;  Service: Cardiovascular;  Laterality: N/A;   CARDIAC CATHETERIZATION  11/1988, 12/07/2004   showing  essentially normal left  ventricular function, moderate two-vessel disease (50% LAD after the third diagonal and a 60+% stenosis in the second obtuse marginal)          CARDIOVERSION N/A 12/05/2011   Procedure: CARDIOVERSION;  Surgeon: Duke Salvia, MD;  Location: [facility] CATH LAB;  Service: Cardiovascular;  Laterality: N/A;   CARDIOVERSION N/A 08/07/2018   Procedure: CARDIOVERSION;   Surgeon: Lewayne Bunting, MD;  Location: [facility] ENDOSCOPY;  Service: Cardiovascular;  Laterality: N/A;   CARDIOVERSION N/A 10/09/2018   Procedure: CARDIOVERSION;  Surgeon: Chrystie Nose, MD;  Location: [facility] ENDOSCOPY;  Service: Cardiovascular;  Laterality: N/A;   DENTAL SURGERY  05/01/2023   FOOT SURGERY  1996   left-retained hardware   HERNIA REPAIR     INGUINAL HERNIA REPAIR  01/28/2008   Recurrent right inguinal hernia Laparoscopic, preperitoneal repair of recurrent right inguinal hernia with mesh -- SURGEON:  Dr. Claud Kelp.   INGUINAL HERNIA REPAIR  11/06/2011   Procedure: HERNIA REPAIR INGUINAL ADULT;  Surgeon: Ernestene Mention, MD;  Location: [facility] ORS;  Service: General;  Laterality: Left;   PACEMAKER INSERTION  11/23/2012   Dr Graciela Husbands   PERMANENT PACEMAKER INSERTION N/A 11/23/2012   Procedure: PERMANENT PACEMAKER INSERTION;  Surgeon: Duke Salvia, MD;  Location: [facility] CATH LAB;  Service: Cardiovascular;  Laterality: N/A;   PPM GENERATOR CHANGEOUT N/A 06/24/2022   Procedure: PPM GENERATOR CHANGEOUT;  Surgeon: Duke Salvia, MD;  Location: [facility] INVASIVE CV LAB;  Service: Cardiovascular;  Laterality: N/A;   TEE WITH CARDIOVERSION  2001, 2006   Invasive electrophysiology study, electroanatomical mapping,and radiofrequency catheter ablation. -- patient's arrhythmia was isthmus dependent flutter not withstanding the very long cycle length.  RF energy delivered across the cavotricuspid isthmus successfully interrupted conduction across the isthmus and eliminated the patient's sub-straight. PHYSICIAN:  Duke Salvia, M.D   TEE WITHOUT CARDIOVERSION  12/31/2011   Procedure: TRANSESOPHAGEAL ECHOCARDIOGRAM (TEE);  Surgeon: Wendall Stade, MD;  Location: [facility] ENDOSCOPY;  Service: Cardiovascular;  Laterality: N/A;  tba after for tikosyn   TEE WITHOUT CARDIOVERSION N/A 07/07/2018   Procedure: TRANSESOPHAGEAL ECHOCARDIOGRAM (TEE);  Surgeon: Elease Hashimoto Deloris Ping, MD;  Location: [facility] ENDOSCOPY;  Service:  Cardiovascular;  Laterality: N/A;   VASECTOMY         Home Medications    Prior to Admission medications   Medication Sig Start Date End Date Taking? Authorizing Provider  chlorhexidine (PERIDEX) 0.12 % solution Use as directed 5 mLs in the mouth or throat 2 (two) times daily. 05/01/23  Yes [provider]  furosemide (LASIX) 40 MG tablet Take 120 mg by mouth daily. 01/20/23  Yes [provider]  ibuprofen (ADVIL) 200 MG tablet Take 200 mg by mouth every 6 (six) hours as needed for fever, headache, mild pain (pain score 1-3), moderate pain (pain score 4-6) or cramping. 01/03/21  Yes [provider]  metolazone (ZAROXOLYN) 2.5 MG tablet Take 1 tablet (2.5 mg total) by mouth 2 (two) times a week. 05/22/23  Yes Duke Salvia, MD  Multiple Vitamins-Minerals (MULTIVITAMIN WITH MINERALS) tablet Take 1 tablet by mouth daily.   Yes [provider]  potassium chloride (KLOR-CON M) 10 MEQ tablet Take 1 tablet by mouth 2 times a week. on the days you take Metolazone 05/23/23  Yes Duke Salvia, MD  rivaroxaban (XARELTO) 20 MG TABS tablet Take 1 tablet by mouth daily. 01/20/23  Yes [provider]  sildenafil (VIAGRA) 100 MG tablet Take 100 mg by mouth as  needed. 11/06/20  Yes [provider]  diphenhydrAMINE (BENADRYL ALLERGY) 25 mg capsule Take 25 mg by mouth every 6 (six) hours as needed for itching, allergies or sleep.    [provider]  diphenhydrAMINE (BENADRYL) 25 MG tablet Take 50 mg by mouth at bedtime.    [provider]  diphenhydrAMINE (BENADRYL) 25 MG tablet Take 25 mg by mouth every 6 (six) hours as needed for itching, allergies or sleep.    [provider]  sildenafil (VIAGRA) 25 MG tablet Take 25 mg by mouth as needed for erectile dysfunction.    [provider]  VIAGRA 100 MG tablet Take 100 mg by mouth as needed for erectile dysfunction.  12/27/15   [provider]  Vitamins-Lipotropics (MEGA  MULTIPLE/CHELATED MINERAL) TABS Take 1 tablet by mouth daily.    [provider]  XARELTO 20 MG TABS tablet TAKE 1 TABLET DAILY WITH SUPPER 03/24/23   Duke Salvia, MD    Family History Family History  Problem Relation Age of Onset   Heart attack Father 60   Cancer Father        colon   Kidney disease Father        End Stage   Coronary artery disease Father        Had Valve Replacement   Atrial fibrillation Mother        flutter   Heart failure Mother 41   Cancer Sister        colon   Cancer Paternal Uncle        lung    Social History Social History   Tobacco Use   Smoking status: Former    Current packs/day: 0.00    Types: Cigarettes    Start date: 08/05/1962    Quit date: 08/05/1966    Years since quitting: 56.8   Smokeless tobacco: Never  Vaping Use   Vaping status: Never Used  Substance Use Topics   Alcohol use: Yes    Alcohol/week: 2.0 standard drinks of alcohol    Types: 2 Shots of liquor per week    Comment: DAILY   Drug use: No     Allergies   Duloxetine hcl, Escitalopram, Horse epithelium, Horse-derived products, and Other   Review of Systems Review of Systems  Constitutional:  Negative for chills and fever.  Eyes:  Negative for discharge and redness.  Skin:  Positive for wound.  Neurological:  Negative for numbness.     Physical Exam Triage Vital Signs ED Triage Vitals  Encounter Vitals Group     BP 06/10/23 1522 117/67     Systolic BP Percentile --      Diastolic BP Percentile --      Pulse Rate 06/10/23 1522 70     Resp 06/10/23 1522 18     Temp 06/10/23 1522 98.1 F (36.7 C)     Temp Source 06/10/23 1522 Oral     SpO2 06/10/23 1522 99 %     Weight 06/10/23 1518 195 lb (88.5 kg)     Height 06/10/23 1518 5' 11.5" (1.816 m)     Head Circumference --      Peak Flow --      Pain Score 06/10/23 1515 0     Pain Loc --      Pain Education --      Exclude from Growth Chart --    No data found.  Updated Vital Signs BP  117/67 (BP Location: Left Arm)   Pulse  70 Comment: Monitor during L.E.T. Application and cleansing wound.  Temp 98.1 F (36.7 C) (Oral)   Resp 18   Ht 5' 11.5" (1.816 m)   Wt 195 lb (88.5 kg)   SpO2 99%   BMI 26.82 kg/m      Physical Exam Vitals and nursing note reviewed.  Constitutional:      General: He is not in acute distress.    Appearance: Normal appearance. He is not ill-appearing.  HENT:     Head: Normocephalic and atraumatic.  Eyes:     Conjunctiva/sclera: Conjunctivae normal.  Cardiovascular:     Rate and Rhythm: Normal rate.  Pulmonary:     Effort: Pulmonary effort is normal. No respiratory distress.  Skin:    Comments: Laceration noted to left  middle finger with minimal bleeding  Neurological:     Mental Status: He is alert.  Psychiatric:        Mood and Affect: Mood normal.        Behavior: Behavior normal.        Thought Content: Thought content normal.      UC Treatments / Results  Labs (all labs ordered are listed, but only abnormal results are displayed) Labs Reviewed - No data to display  EKG   Radiology No results found.  Procedures Procedures (including critical care time)  Medications Ordered in UC Medications  lidocaine-EPINEPHrine-tetracaine (LET) topical gel (3 mLs Topical Given 06/10/23 1529)  Tdap (BOOSTRIX) injection 0.5 mL (0.5 mLs Intramuscular Given 06/10/23 1653)    Initial Impression / Assessment and Plan / UC Course  I have reviewed the triage vital signs and the nursing notes.  Pertinent labs & imaging results that were available during my care of the patient were reviewed by me and considered in my medical decision making (see chart for details).    Wound edges approximated with Dermabond.  Tdap administered in office.  Recommended continued monitoring for any signs of infection or recurrent bleeding etc.  Encouraged follow-up with any concerns.   Final Clinical Impressions(s) / UC Diagnoses   Final diagnoses:   Laceration of left middle finger without foreign body without damage to nail, initial encounter   Discharge Instructions   None    ED Prescriptions   None    PDMP not reviewed this encounter.   Tomi Bamberger, PA-C 07/02/23 1010

## 2023-06-18 DIAGNOSIS — C44619 Basal cell carcinoma of skin of left upper limb, including shoulder: Secondary | ICD-10-CM | POA: Diagnosis not present

## 2023-06-24 ENCOUNTER — Ambulatory Visit (INDEPENDENT_AMBULATORY_CARE_PROVIDER_SITE_OTHER): Payer: Medicare Other

## 2023-06-24 DIAGNOSIS — I495 Sick sinus syndrome: Secondary | ICD-10-CM | POA: Diagnosis not present

## 2023-06-24 LAB — CUP PACEART REMOTE DEVICE CHECK
Battery Remaining Longevity: 168 mo
Battery Remaining Percentage: 100 %
Brady Statistic RA Percent Paced: 0 %
Brady Statistic RV Percent Paced: 99 %
Date Time Interrogation Session: 20241119072200
Implantable Lead Connection Status: 753985
Implantable Lead Connection Status: 753985
Implantable Lead Implant Date: 20140421
Implantable Lead Implant Date: 20140421
Implantable Lead Location: 753859
Implantable Lead Location: 753860
Implantable Lead Model: 5076
Implantable Lead Model: 5076
Implantable Pulse Generator Implant Date: 20231120
Lead Channel Impedance Value: 597 Ohm
Lead Channel Impedance Value: 762 Ohm
Lead Channel Pacing Threshold Amplitude: 0.9 V
Lead Channel Pacing Threshold Pulse Width: 0.4 ms
Lead Channel Setting Pacing Amplitude: 2.6 V
Lead Channel Setting Pacing Pulse Width: 0.4 ms
Lead Channel Setting Sensing Sensitivity: 2.5 mV
Pulse Gen Serial Number: 617081
Zone Setting Status: 755011

## 2023-07-02 ENCOUNTER — Encounter: Payer: Self-pay | Admitting: Physician Assistant

## 2023-07-21 NOTE — Progress Notes (Signed)
Remote pacemaker transmission.   

## 2023-08-01 DIAGNOSIS — H02831 Dermatochalasis of right upper eyelid: Secondary | ICD-10-CM | POA: Diagnosis not present

## 2023-08-01 DIAGNOSIS — H02834 Dermatochalasis of left upper eyelid: Secondary | ICD-10-CM | POA: Diagnosis not present

## 2023-08-20 DIAGNOSIS — H40013 Open angle with borderline findings, low risk, bilateral: Secondary | ICD-10-CM | POA: Diagnosis not present

## 2023-08-20 DIAGNOSIS — D3131 Benign neoplasm of right choroid: Secondary | ICD-10-CM | POA: Diagnosis not present

## 2023-08-20 DIAGNOSIS — H02054 Trichiasis without entropian left upper eyelid: Secondary | ICD-10-CM | POA: Diagnosis not present

## 2023-08-20 DIAGNOSIS — H52223 Regular astigmatism, bilateral: Secondary | ICD-10-CM | POA: Diagnosis not present

## 2023-08-20 DIAGNOSIS — H10823 Rosacea conjunctivitis, bilateral: Secondary | ICD-10-CM | POA: Diagnosis not present

## 2023-08-29 ENCOUNTER — Other Ambulatory Visit: Payer: Self-pay | Admitting: Internal Medicine

## 2023-09-23 ENCOUNTER — Ambulatory Visit (INDEPENDENT_AMBULATORY_CARE_PROVIDER_SITE_OTHER): Payer: Medicare Other

## 2023-09-23 DIAGNOSIS — I495 Sick sinus syndrome: Secondary | ICD-10-CM

## 2023-09-24 LAB — CUP PACEART REMOTE DEVICE CHECK
Battery Remaining Longevity: 174 mo
Battery Remaining Percentage: 100 %
Brady Statistic RA Percent Paced: 0 %
Brady Statistic RV Percent Paced: 99 %
Date Time Interrogation Session: 20250218070600
Implantable Lead Connection Status: 753985
Implantable Lead Connection Status: 753985
Implantable Lead Implant Date: 20140421
Implantable Lead Implant Date: 20140421
Implantable Lead Location: 753859
Implantable Lead Location: 753860
Implantable Lead Model: 5076
Implantable Lead Model: 5076
Implantable Pulse Generator Implant Date: 20231120
Lead Channel Impedance Value: 606 Ohm
Lead Channel Impedance Value: 745 Ohm
Lead Channel Pacing Threshold Amplitude: 0.8 V
Lead Channel Pacing Threshold Pulse Width: 0.4 ms
Lead Channel Setting Pacing Amplitude: 2.6 V
Lead Channel Setting Pacing Pulse Width: 0.4 ms
Lead Channel Setting Sensing Sensitivity: 2.5 mV
Pulse Gen Serial Number: 617081
Zone Setting Status: 755011

## 2023-10-21 ENCOUNTER — Encounter: Payer: Self-pay | Admitting: Internal Medicine

## 2023-10-28 ENCOUNTER — Ambulatory Visit: Payer: Medicare Other | Attending: Internal Medicine | Admitting: Internal Medicine

## 2023-10-28 ENCOUNTER — Encounter: Payer: Self-pay | Admitting: Internal Medicine

## 2023-10-28 VITALS — BP 128/74 | HR 70 | Ht 71.5 in | Wt 210.0 lb

## 2023-10-28 DIAGNOSIS — I452 Bifascicular block: Secondary | ICD-10-CM | POA: Diagnosis not present

## 2023-10-28 DIAGNOSIS — I5032 Chronic diastolic (congestive) heart failure: Secondary | ICD-10-CM | POA: Diagnosis not present

## 2023-10-28 DIAGNOSIS — I4821 Permanent atrial fibrillation: Secondary | ICD-10-CM | POA: Diagnosis not present

## 2023-10-28 DIAGNOSIS — Z950 Presence of cardiac pacemaker: Secondary | ICD-10-CM | POA: Diagnosis not present

## 2023-10-28 NOTE — Patient Instructions (Signed)

## 2023-10-28 NOTE — Progress Notes (Unsigned)
 Patient Care Team: Ceasar Lund, PA as PCP - General (Physician Assistant) Duke Salvia, MD as PCP - Cardiology (Cardiology)   HPI  [name] is a 78 y.o. male Seen in followup for atrial fibrillation for which he took [place], underwent AFib ablation Fawn Kirk- 12/19) and DCCV 3/20 but failed to hold sinus and with severe R atriopathy >>permanent atrial fibrillation  [employer] pacemaker (DOI 4/14)..  There was device failure prompting generator replacement 11/23 underlying heart block     Anticoagulation with rivaroxaban  no  bleeding  Exuberant alcohol intake   Continues  to complain of DOE, no chest pain orthopnea PND  occ edema    Date Cr K Hgb LDL  2/17  1.08 3.7     8/17  0.93 4.1     9/18 0.91 4.9 16.0   3/20  0.81 3.4 13.9   6/20 1.0 4.3 16.6   1/21 0.89 3.8 15.7   4/23 1.08 3.8 17.2   4/24 0.95 3.6 15.7       DATE TEST  EF    4/13 Echo   55-65 %    3/14    Myoview   Images Personally reviewed  Normal    11/19  Echo   LAE (45/2.05/26)   12/19 TEE 60-65% Severe R Atriopathy  8/21 Echo 55-60%    7/21 CTA   CA calcifications But not gated study ordered by mistake  3/23 Myoview  68% No ischemia       Thromboembolic risk factors ( age  -2, HTN-1, Vasc disease -1) for a CHADSVASc Score of 4     Past Medical History:  Diagnosis Date   1st degree AV block    Atrial flutter (HCC)    s/p ablation 2006   CAD (coronary artery disease)    Mild 2 vessel disease per cath in 2006; myoview neg 2011   Chronic anticoagulation    on Xarelto - started 11/12/11   Depression 11/05/2011   On Cymbalta   GERD (gastroesophageal reflux disease)    HTN (hypertension) 02/12/2011   Hyperlipidemia    Hypertension    Major depression single episode, 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   Normal nuclear stress test 2011   Persistent atrial fibrillation (HCC)    recent new occurrence 3/13>>Tikosyn   PVC's (premature ventricular contractions)    Right  ventricular enlargement    Mild noted echo 3/13   Sick sinus syndrome (HCC) 11/23/2012   Dr Graciela Husbands   Tobacco abuse     Past Surgical History:  Procedure Laterality Date   ABLATION OF DYSRHYTHMIC FOCUS  07/07/2018   ATRIAL FIBRILLATION ABLATION N/A 07/07/2018   Procedure: ATRIAL FIBRILLATION ABLATION;  Surgeon: Hillis Range, MD;  Location: [facility] INVASIVE CV LAB;  Service: Cardiovascular;  Laterality: N/A;   CARDIAC CATHETERIZATION  11/1988, 12/07/2004   showing essentially normal left  ventricular function, moderate two-vessel disease (50% LAD after the third diagonal and a 60+% stenosis in the second obtuse marginal)          CARDIOVERSION N/A 12/05/2011   Procedure: CARDIOVERSION;  Surgeon: Duke Salvia, MD;  Location: [facility] CATH LAB;  Service: Cardiovascular;  Laterality: N/A;   CARDIOVERSION N/A 08/07/2018   Procedure: CARDIOVERSION;  Surgeon: Lewayne Bunting, MD;  Location: [facility] ENDOSCOPY;  Service: Cardiovascular;  Laterality: N/A;   CARDIOVERSION N/A 10/09/2018   Procedure: CARDIOVERSION;  Surgeon: Chrystie Nose, MD;  Location: [facility] ENDOSCOPY;  Service: Cardiovascular;  Laterality:  N/A;   DENTAL SURGERY  05/01/2023   FOOT SURGERY  1996   left-retained hardware   HERNIA REPAIR     INGUINAL HERNIA REPAIR  01/28/2008   Recurrent right inguinal hernia Laparoscopic, preperitoneal repair of recurrent right inguinal hernia with mesh -- SURGEON:  Dr. Claud Kelp.   INGUINAL HERNIA REPAIR  11/06/2011   Procedure: HERNIA REPAIR INGUINAL ADULT;  Surgeon: Ernestene Mention, MD;  Location: [facility] ORS;  Service: General;  Laterality: Left;   PACEMAKER INSERTION  11/23/2012   Dr Graciela Husbands   PERMANENT PACEMAKER INSERTION N/A 11/23/2012   Procedure: PERMANENT PACEMAKER INSERTION;  Surgeon: Duke Salvia, MD;  Location: [facility] CATH LAB;  Service: Cardiovascular;  Laterality: N/A;   PPM GENERATOR CHANGEOUT N/A 06/24/2022   Procedure: PPM GENERATOR CHANGEOUT;  Surgeon: Duke Salvia, MD;  Location: [facility]  INVASIVE CV LAB;  Service: Cardiovascular;  Laterality: N/A;   TEE WITH CARDIOVERSION  2001, 2006   Invasive electrophysiology study, electroanatomical mapping,and radiofrequency catheter ablation. -- patient's arrhythmia was isthmus dependent flutter not withstanding the very long cycle length.  RF energy delivered across the cavotricuspid isthmus successfully interrupted conduction across the isthmus and eliminated the patient's sub-straight. PHYSICIAN:  Duke Salvia, M.D   TEE WITHOUT CARDIOVERSION  12/31/2011   Procedure: TRANSESOPHAGEAL ECHOCARDIOGRAM (TEE);  Surgeon: Wendall Stade, MD;  Location: [facility] ENDOSCOPY;  Service: Cardiovascular;  Laterality: N/A;  tba after for tikosyn   TEE WITHOUT CARDIOVERSION N/A 07/07/2018   Procedure: TRANSESOPHAGEAL ECHOCARDIOGRAM (TEE);  Surgeon: Elease Hashimoto Deloris Ping, MD;  Location: [facility] ENDOSCOPY;  Service: Cardiovascular;  Laterality: N/A;   VASECTOMY      Current Outpatient Medications  Medication Sig Dispense Refill   furosemide (LASIX) 40 MG tablet Take 120 mg by mouth daily.     metolazone (ZAROXOLYN) 2.5 MG tablet Take 1 tablet (2.5 mg total) by mouth 2 (two) times a week. 24 tablet 1   Multiple Vitamins-Minerals (MULTIVITAMIN WITH MINERALS) tablet Take 1 tablet by mouth daily.     potassium chloride (KLOR-CON M) 10 MEQ tablet TAKE 1 TABLET TWICE A WEEK. ON THE DAYS YOU TAKE METOLAZONE 24 tablet 0   rivaroxaban (XARELTO) 20 MG TABS tablet Take 1 tablet by mouth daily.     sildenafil (VIAGRA) 100 MG tablet Take 100 mg by mouth as needed.     No current facility-administered medications for this visit.    Allergies  Allergen Reactions   Duloxetine Hcl     Other reaction(s): nausea, diarrhea  Other Reaction(s): nausea, diarrhea   Escitalopram Other (See Comments)    Other Reaction(s): jaw clenching, Other (See Comments)  Other Reaction(s): Other (See Comments)   Horse Epithelium Hives   Horse-Derived Products Hives    Other Reaction(s): Horse  serum-hives   Other     Other reaction(s): jaw clenching    Review of Systems negative except from HPI and PMH  Physical Exam BP 128/74   Pulse 70   Ht 5' 11.5" (1.816 m)   Wt 210 lb (95.3 kg)   SpO2 96%   BMI 28.88 kg/m  Well developed and well nourished in no acute distress HENT normal Neck supple with JVP-flat Clear Device pocket well healed; without hematoma or erythema.  There is no tethering  Regular rate and rhythm, no  gallop No  murmur Abd-soft with active BS No Clubbing cyanosis  edema Skin-warm and dry A & Oriented  Grossly normal sensory and motor function  ECG atrial fibrillation at 70 Interval-/19/48 Axis left -  85  Device function is normal. Programming changes   See Paceart for details     Assessment and  Plan  Coronary artery disease-cath 2006 moderate nonobstructive  Atrial  Fibrillation-permanent  Bifascicular block  Pacemaker  BSx    Hypertension  HFpEF  Ventricular pacing > 50%   Polycythemia  No angina.  We undertook a series of reprogramming trials trying to improve heart rate response to activity and ultimately settled on a change in threshold from medium to medium low and if I recall a increase in the slope from 10-12 and with walking he felt better.  No bleeding.  Continue his anticoagulant  Euvolemic.  Continue his furosemide and Zaroxolyn combination

## 2023-10-30 NOTE — Addendum Note (Signed)
 Addended by: Elease Etienne A on: 10/30/2023 12:49 PM   Modules accepted: Orders

## 2023-10-30 NOTE — Progress Notes (Signed)
 Remote pacemaker transmission.

## 2023-11-05 LAB — CUP PACEART INCLINIC DEVICE CHECK
Brady Statistic RV Percent Paced: 100 %
Date Time Interrogation Session: 20250325000000
Implantable Lead Connection Status: 753985
Implantable Lead Connection Status: 753985
Implantable Lead Implant Date: 20140421
Implantable Lead Implant Date: 20140421
Implantable Lead Location: 753859
Implantable Lead Location: 753860
Implantable Lead Model: 5076
Implantable Lead Model: 5076
Implantable Pulse Generator Implant Date: 20231120
Lead Channel Setting Pacing Amplitude: 2.6 V
Lead Channel Setting Pacing Pulse Width: 0.4 ms
Lead Channel Setting Sensing Sensitivity: 2.5 mV
Pulse Gen Serial Number: 617081
Zone Setting Status: 755011

## 2023-11-07 DIAGNOSIS — L111 Transient acantholytic dermatosis [Grover]: Secondary | ICD-10-CM | POA: Diagnosis not present

## 2023-11-07 DIAGNOSIS — L570 Actinic keratosis: Secondary | ICD-10-CM | POA: Diagnosis not present

## 2023-11-07 DIAGNOSIS — L308 Other specified dermatitis: Secondary | ICD-10-CM | POA: Diagnosis not present

## 2023-11-07 DIAGNOSIS — D2271 Melanocytic nevi of right lower limb, including hip: Secondary | ICD-10-CM | POA: Diagnosis not present

## 2023-11-07 DIAGNOSIS — D2262 Melanocytic nevi of left upper limb, including shoulder: Secondary | ICD-10-CM | POA: Diagnosis not present

## 2023-11-07 DIAGNOSIS — Z85828 Personal history of other malignant neoplasm of skin: Secondary | ICD-10-CM | POA: Diagnosis not present

## 2023-11-07 DIAGNOSIS — L578 Other skin changes due to chronic exposure to nonionizing radiation: Secondary | ICD-10-CM | POA: Diagnosis not present

## 2023-11-07 DIAGNOSIS — L821 Other seborrheic keratosis: Secondary | ICD-10-CM | POA: Diagnosis not present

## 2023-11-07 DIAGNOSIS — D225 Melanocytic nevi of trunk: Secondary | ICD-10-CM | POA: Diagnosis not present

## 2023-11-07 DIAGNOSIS — D485 Neoplasm of uncertain behavior of skin: Secondary | ICD-10-CM | POA: Diagnosis not present

## 2023-11-07 DIAGNOSIS — L905 Scar conditions and fibrosis of skin: Secondary | ICD-10-CM | POA: Diagnosis not present

## 2023-11-21 ENCOUNTER — Other Ambulatory Visit: Payer: Self-pay | Admitting: Internal Medicine

## 2023-12-04 DIAGNOSIS — N529 Male erectile dysfunction, unspecified: Secondary | ICD-10-CM | POA: Diagnosis not present

## 2023-12-04 DIAGNOSIS — E7800 Pure hypercholesterolemia, unspecified: Secondary | ICD-10-CM | POA: Diagnosis not present

## 2023-12-04 DIAGNOSIS — Z0000 Encounter for general adult medical examination without abnormal findings: Secondary | ICD-10-CM | POA: Diagnosis not present

## 2023-12-04 DIAGNOSIS — R7303 Prediabetes: Secondary | ICD-10-CM | POA: Diagnosis not present

## 2023-12-04 DIAGNOSIS — I4891 Unspecified atrial fibrillation: Secondary | ICD-10-CM | POA: Diagnosis not present

## 2023-12-04 DIAGNOSIS — Z1331 Encounter for screening for depression: Secondary | ICD-10-CM | POA: Diagnosis not present

## 2023-12-04 DIAGNOSIS — M720 Palmar fascial fibromatosis [Dupuytren]: Secondary | ICD-10-CM | POA: Diagnosis not present

## 2023-12-04 DIAGNOSIS — D6869 Other thrombophilia: Secondary | ICD-10-CM | POA: Diagnosis not present

## 2023-12-04 DIAGNOSIS — I509 Heart failure, unspecified: Secondary | ICD-10-CM | POA: Diagnosis not present

## 2023-12-23 ENCOUNTER — Ambulatory Visit (INDEPENDENT_AMBULATORY_CARE_PROVIDER_SITE_OTHER): Payer: Medicare Other

## 2023-12-23 DIAGNOSIS — I495 Sick sinus syndrome: Secondary | ICD-10-CM | POA: Diagnosis not present

## 2023-12-24 LAB — CUP PACEART REMOTE DEVICE CHECK
Battery Remaining Longevity: 168 mo
Battery Remaining Percentage: 100 %
Brady Statistic RA Percent Paced: 0 %
Brady Statistic RV Percent Paced: 99 %
Date Time Interrogation Session: 20250520055100
Implantable Lead Connection Status: 753985
Implantable Lead Connection Status: 753985
Implantable Lead Implant Date: 20140421
Implantable Lead Implant Date: 20140421
Implantable Lead Location: 753859
Implantable Lead Location: 753860
Implantable Lead Model: 5076
Implantable Lead Model: 5076
Implantable Pulse Generator Implant Date: 20231120
Lead Channel Impedance Value: 580 Ohm
Lead Channel Impedance Value: 743 Ohm
Lead Channel Pacing Threshold Amplitude: 0.9 V
Lead Channel Pacing Threshold Pulse Width: 0.4 ms
Lead Channel Setting Pacing Amplitude: 2.6 V
Lead Channel Setting Pacing Pulse Width: 0.4 ms
Lead Channel Setting Sensing Sensitivity: 2.5 mV
Pulse Gen Serial Number: 617081
Zone Setting Status: 755011

## 2024-01-01 ENCOUNTER — Ambulatory Visit: Payer: Self-pay | Admitting: Cardiovascular Disease

## 2024-01-16 ENCOUNTER — Other Ambulatory Visit: Payer: Self-pay | Admitting: Internal Medicine

## 2024-02-13 NOTE — Addendum Note (Signed)
 Addended by: VICCI SELLER A on: 02/13/2024 10:26 AM   Modules accepted: Orders

## 2024-02-13 NOTE — Progress Notes (Signed)
 Remote pacemaker transmission.

## 2024-03-09 ENCOUNTER — Other Ambulatory Visit: Payer: Self-pay | Admitting: Internal Medicine

## 2024-03-23 ENCOUNTER — Ambulatory Visit (INDEPENDENT_AMBULATORY_CARE_PROVIDER_SITE_OTHER): Payer: Medicare Other

## 2024-03-23 DIAGNOSIS — I495 Sick sinus syndrome: Secondary | ICD-10-CM | POA: Diagnosis not present

## 2024-03-24 LAB — CUP PACEART REMOTE DEVICE CHECK
Battery Remaining Longevity: 168 mo
Battery Remaining Percentage: 100 %
Brady Statistic RA Percent Paced: 0 %
Brady Statistic RV Percent Paced: 99 %
Date Time Interrogation Session: 20250819080500
Implantable Lead Connection Status: 753985
Implantable Lead Connection Status: 753985
Implantable Lead Implant Date: 20140421
Implantable Lead Implant Date: 20140421
Implantable Lead Location: 753859
Implantable Lead Location: 753860
Implantable Lead Model: 5076
Implantable Lead Model: 5076
Implantable Pulse Generator Implant Date: 20231120
Lead Channel Impedance Value: 584 Ohm
Lead Channel Impedance Value: 766 Ohm
Lead Channel Pacing Threshold Amplitude: 0.9 V
Lead Channel Pacing Threshold Pulse Width: 0.4 ms
Lead Channel Setting Pacing Amplitude: 2.6 V
Lead Channel Setting Pacing Pulse Width: 0.4 ms
Lead Channel Setting Sensing Sensitivity: 2.5 mV
Pulse Gen Serial Number: 617081
Zone Setting Status: 755011

## 2024-03-29 ENCOUNTER — Ambulatory Visit: Payer: Self-pay | Admitting: Cardiovascular Disease

## 2024-04-28 NOTE — Progress Notes (Signed)
 Remote PPM Transmission

## 2024-06-22 ENCOUNTER — Ambulatory Visit: Payer: Medicare Other

## 2024-06-22 DIAGNOSIS — I495 Sick sinus syndrome: Secondary | ICD-10-CM

## 2024-06-23 LAB — CUP PACEART REMOTE DEVICE CHECK
Battery Remaining Longevity: 162 mo
Battery Remaining Percentage: 100 %
Brady Statistic RA Percent Paced: 0 %
Brady Statistic RV Percent Paced: 99 %
Date Time Interrogation Session: 20251118090500
Implantable Lead Connection Status: 753985
Implantable Lead Connection Status: 753985
Implantable Lead Implant Date: 20140421
Implantable Lead Implant Date: 20140421
Implantable Lead Location: 753859
Implantable Lead Location: 753860
Implantable Lead Model: 5076
Implantable Lead Model: 5076
Implantable Pulse Generator Implant Date: 20231120
Lead Channel Impedance Value: 611 Ohm
Lead Channel Impedance Value: 755 Ohm
Lead Channel Pacing Threshold Amplitude: 0.7 V
Lead Channel Pacing Threshold Pulse Width: 0.4 ms
Lead Channel Setting Pacing Amplitude: 2.6 V
Lead Channel Setting Pacing Pulse Width: 0.4 ms
Lead Channel Setting Sensing Sensitivity: 2.5 mV
Pulse Gen Serial Number: 617081
Zone Setting Status: 755011

## 2024-06-25 NOTE — Progress Notes (Signed)
 Remote PPM Transmission

## 2024-07-05 ENCOUNTER — Ambulatory Visit: Payer: Self-pay | Admitting: Cardiovascular Disease

## 2024-09-21 ENCOUNTER — Ambulatory Visit

## 2024-11-12 ENCOUNTER — Ambulatory Visit: Admitting: Cardiovascular Disease

## 2024-12-21 ENCOUNTER — Ambulatory Visit

## 2025-03-22 ENCOUNTER — Ambulatory Visit

## 2025-06-21 ENCOUNTER — Ambulatory Visit
